# Patient Record
Sex: Female | Born: 1950 | Race: White | Hispanic: No | Marital: Married | Smoking: Current some day smoker
Health system: Southern US, Community
[De-identification: ages and names within clinical notes are randomized; demographics above are authoritative.]

## PROBLEM LIST (undated history)

## (undated) DIAGNOSIS — I10 Essential (primary) hypertension: Secondary | ICD-10-CM

## (undated) DIAGNOSIS — T8859XA Other complications of anesthesia, initial encounter: Secondary | ICD-10-CM

## (undated) DIAGNOSIS — M8580 Other specified disorders of bone density and structure, unspecified site: Secondary | ICD-10-CM

## (undated) DIAGNOSIS — E079 Disorder of thyroid, unspecified: Secondary | ICD-10-CM

## (undated) DIAGNOSIS — F319 Bipolar disorder, unspecified: Secondary | ICD-10-CM

## (undated) DIAGNOSIS — N19 Unspecified kidney failure: Secondary | ICD-10-CM

## (undated) DIAGNOSIS — G4733 Obstructive sleep apnea (adult) (pediatric): Secondary | ICD-10-CM

## (undated) DIAGNOSIS — J449 Chronic obstructive pulmonary disease, unspecified: Secondary | ICD-10-CM

## (undated) DIAGNOSIS — E785 Hyperlipidemia, unspecified: Secondary | ICD-10-CM

## (undated) DIAGNOSIS — H3530 Unspecified macular degeneration: Secondary | ICD-10-CM

## (undated) DIAGNOSIS — T7840XA Allergy, unspecified, initial encounter: Secondary | ICD-10-CM

## (undated) DIAGNOSIS — H9190 Unspecified hearing loss, unspecified ear: Secondary | ICD-10-CM

## (undated) DIAGNOSIS — R339 Retention of urine, unspecified: Secondary | ICD-10-CM

## (undated) DIAGNOSIS — B9620 Unspecified Escherichia coli [E. coli] as the cause of diseases classified elsewhere: Secondary | ICD-10-CM

## (undated) DIAGNOSIS — M1990 Unspecified osteoarthritis, unspecified site: Secondary | ICD-10-CM

## (undated) DIAGNOSIS — T4145XA Adverse effect of unspecified anesthetic, initial encounter: Secondary | ICD-10-CM

## (undated) DIAGNOSIS — Z9989 Dependence on other enabling machines and devices: Secondary | ICD-10-CM

## (undated) DIAGNOSIS — A419 Sepsis, unspecified organism: Secondary | ICD-10-CM

## (undated) DIAGNOSIS — N390 Urinary tract infection, site not specified: Secondary | ICD-10-CM

## (undated) DIAGNOSIS — C649 Malignant neoplasm of unspecified kidney, except renal pelvis: Secondary | ICD-10-CM

## (undated) HISTORY — PX: CHOLECYSTECTOMY: SHX55

## (undated) HISTORY — DX: Unspecified osteoarthritis, unspecified site: M19.90

## (undated) HISTORY — DX: Disorder of thyroid, unspecified: E07.9

## (undated) HISTORY — DX: Unspecified macular degeneration: H35.30

## (undated) HISTORY — DX: Other specified disorders of bone density and structure, unspecified site: M85.80

## (undated) HISTORY — DX: Malignant neoplasm of unspecified kidney, except renal pelvis: C64.9

## (undated) HISTORY — PX: BLADDER SUSPENSION: SHX72

## (undated) HISTORY — DX: Essential (primary) hypertension: I10

## (undated) HISTORY — DX: Hyperlipidemia, unspecified: E78.5

## (undated) HISTORY — PX: TIBIA FRACTURE SURGERY: SHX806

## (undated) HISTORY — PX: ABDOMINAL HYSTERECTOMY: SHX81

## (undated) HISTORY — PX: TONSILLECTOMY: SUR1361

## (undated) HISTORY — PX: ROTATOR CUFF REPAIR: SHX139

## (undated) HISTORY — DX: Chronic obstructive pulmonary disease, unspecified: J44.9

## (undated) HISTORY — DX: Bipolar disorder, unspecified: F31.9

## (undated) HISTORY — DX: Allergy, unspecified, initial encounter: T78.40XA

## (undated) HISTORY — PX: NEPHRECTOMY: SHX65

## (undated) HISTORY — DX: Retention of urine, unspecified: R33.9

## (undated) HISTORY — PX: APPENDECTOMY: SHX54

## (undated) HISTORY — PX: EXTERNAL EAR SURGERY: SHX627

## (undated) HISTORY — DX: Unspecified hearing loss, unspecified ear: H91.90

## (undated) HISTORY — PX: CERVICAL DISC SURGERY: SHX588

---

## 1998-02-19 HISTORY — PX: LIVER BIOPSY: SHX301

## 1999-02-12 ENCOUNTER — Encounter: Payer: Self-pay | Admitting: Neurosurgery

## 1999-02-13 ENCOUNTER — Ambulatory Visit (HOSPITAL_COMMUNITY): Admission: RE | Admit: 1999-02-13 | Discharge: 1999-02-14 | Payer: Self-pay | Admitting: Neurosurgery

## 1999-02-13 ENCOUNTER — Encounter: Payer: Self-pay | Admitting: Neurosurgery

## 1999-03-17 ENCOUNTER — Encounter: Admission: RE | Admit: 1999-03-17 | Discharge: 1999-03-17 | Payer: Self-pay | Admitting: Neurosurgery

## 1999-03-17 ENCOUNTER — Encounter: Payer: Self-pay | Admitting: Neurosurgery

## 1999-04-21 ENCOUNTER — Encounter: Payer: Self-pay | Admitting: Neurosurgery

## 1999-04-21 ENCOUNTER — Encounter: Admission: RE | Admit: 1999-04-21 | Discharge: 1999-04-21 | Payer: Self-pay | Admitting: Neurosurgery

## 1999-05-20 ENCOUNTER — Encounter: Admission: RE | Admit: 1999-05-20 | Discharge: 1999-05-20 | Payer: Self-pay | Admitting: Neurosurgery

## 1999-05-20 ENCOUNTER — Encounter: Payer: Self-pay | Admitting: Neurosurgery

## 1999-06-10 ENCOUNTER — Inpatient Hospital Stay (HOSPITAL_COMMUNITY): Admission: RE | Admit: 1999-06-10 | Discharge: 1999-06-11 | Payer: Self-pay | Admitting: Neurosurgery

## 1999-07-29 ENCOUNTER — Encounter: Payer: Self-pay | Admitting: Neurosurgery

## 1999-07-29 ENCOUNTER — Encounter: Admission: RE | Admit: 1999-07-29 | Discharge: 1999-07-29 | Payer: Self-pay | Admitting: Neurosurgery

## 1999-12-20 ENCOUNTER — Encounter: Payer: Self-pay | Admitting: Family Medicine

## 1999-12-20 LAB — CONVERTED CEMR LAB

## 2000-01-15 ENCOUNTER — Other Ambulatory Visit: Admission: RE | Admit: 2000-01-15 | Discharge: 2000-01-15 | Payer: Self-pay | Admitting: Family Medicine

## 2000-01-20 HISTORY — PX: LEEP: SHX91

## 2000-05-19 ENCOUNTER — Other Ambulatory Visit: Admission: RE | Admit: 2000-05-19 | Discharge: 2000-05-19 | Payer: Self-pay | Admitting: Family Medicine

## 2000-05-27 ENCOUNTER — Encounter: Payer: Self-pay | Admitting: Family Medicine

## 2000-09-28 ENCOUNTER — Inpatient Hospital Stay (HOSPITAL_COMMUNITY): Admission: EM | Admit: 2000-09-28 | Discharge: 2000-10-02 | Payer: Self-pay | Admitting: Emergency Medicine

## 2003-06-14 ENCOUNTER — Other Ambulatory Visit: Payer: Self-pay

## 2003-06-19 ENCOUNTER — Other Ambulatory Visit: Payer: Self-pay

## 2003-06-20 HISTORY — PX: SEPTOPLASTY: SUR1290

## 2003-06-26 ENCOUNTER — Other Ambulatory Visit: Payer: Self-pay

## 2003-11-29 ENCOUNTER — Ambulatory Visit: Payer: Self-pay | Admitting: Urology

## 2003-12-04 ENCOUNTER — Ambulatory Visit: Payer: Self-pay | Admitting: Unknown Physician Specialty

## 2004-03-10 ENCOUNTER — Ambulatory Visit: Payer: Self-pay | Admitting: Family Medicine

## 2004-04-23 ENCOUNTER — Ambulatory Visit: Payer: Self-pay | Admitting: Family Medicine

## 2004-04-24 ENCOUNTER — Ambulatory Visit: Payer: Self-pay | Admitting: Family Medicine

## 2004-05-22 ENCOUNTER — Ambulatory Visit: Payer: Self-pay | Admitting: Family Medicine

## 2004-06-11 ENCOUNTER — Ambulatory Visit: Payer: Self-pay | Admitting: Urology

## 2004-08-13 ENCOUNTER — Ambulatory Visit: Payer: Self-pay | Admitting: Family Medicine

## 2004-08-28 ENCOUNTER — Ambulatory Visit: Payer: Self-pay

## 2004-08-28 ENCOUNTER — Ambulatory Visit: Payer: Self-pay | Admitting: Family Medicine

## 2004-09-30 ENCOUNTER — Ambulatory Visit: Payer: Self-pay | Admitting: Family Medicine

## 2004-10-17 ENCOUNTER — Ambulatory Visit: Payer: Self-pay | Admitting: Urology

## 2004-10-30 ENCOUNTER — Ambulatory Visit: Payer: Self-pay | Admitting: Family Medicine

## 2004-11-14 ENCOUNTER — Ambulatory Visit: Payer: Self-pay | Admitting: Internal Medicine

## 2004-12-03 ENCOUNTER — Ambulatory Visit: Payer: Self-pay | Admitting: Family Medicine

## 2004-12-05 ENCOUNTER — Ambulatory Visit: Payer: Self-pay | Admitting: Unknown Physician Specialty

## 2005-03-06 ENCOUNTER — Ambulatory Visit: Payer: Self-pay | Admitting: Family Medicine

## 2005-03-25 ENCOUNTER — Other Ambulatory Visit: Payer: Self-pay

## 2005-03-25 ENCOUNTER — Ambulatory Visit: Payer: Self-pay | Admitting: Specialist

## 2005-03-30 ENCOUNTER — Ambulatory Visit: Payer: Self-pay | Admitting: Family Medicine

## 2005-03-31 ENCOUNTER — Ambulatory Visit: Payer: Self-pay | Admitting: Specialist

## 2005-05-06 ENCOUNTER — Ambulatory Visit: Payer: Self-pay | Admitting: Family Medicine

## 2005-10-01 ENCOUNTER — Ambulatory Visit: Payer: Self-pay | Admitting: Family Medicine

## 2005-10-02 ENCOUNTER — Emergency Department: Payer: Self-pay | Admitting: Emergency Medicine

## 2005-10-03 ENCOUNTER — Emergency Department: Payer: Self-pay | Admitting: Emergency Medicine

## 2005-10-04 ENCOUNTER — Emergency Department: Payer: Self-pay | Admitting: Emergency Medicine

## 2005-10-06 ENCOUNTER — Ambulatory Visit: Payer: Self-pay | Admitting: Family Medicine

## 2005-10-23 ENCOUNTER — Emergency Department: Payer: Self-pay | Admitting: Urology

## 2005-10-30 ENCOUNTER — Ambulatory Visit: Payer: Self-pay | Admitting: Family Medicine

## 2005-11-05 ENCOUNTER — Ambulatory Visit: Payer: Self-pay | Admitting: Family Medicine

## 2005-11-18 ENCOUNTER — Other Ambulatory Visit: Payer: Self-pay

## 2005-11-18 ENCOUNTER — Ambulatory Visit: Payer: Self-pay

## 2006-01-13 ENCOUNTER — Ambulatory Visit: Payer: Self-pay | Admitting: Unknown Physician Specialty

## 2006-01-20 ENCOUNTER — Ambulatory Visit: Payer: Self-pay | Admitting: Family Medicine

## 2006-01-25 ENCOUNTER — Ambulatory Visit: Payer: Self-pay | Admitting: Cardiology

## 2006-02-04 ENCOUNTER — Ambulatory Visit: Payer: Self-pay

## 2006-02-04 ENCOUNTER — Encounter: Payer: Self-pay | Admitting: Cardiology

## 2006-02-17 ENCOUNTER — Ambulatory Visit: Payer: Self-pay | Admitting: Cardiology

## 2006-03-22 ENCOUNTER — Ambulatory Visit: Payer: Self-pay | Admitting: Family Medicine

## 2006-03-22 LAB — CONVERTED CEMR LAB
ALT: 20 units/L (ref 0–40)
AST: 19 units/L (ref 0–37)
Albumin: 3.6 g/dL (ref 3.5–5.2)
Alkaline Phosphatase: 95 units/L (ref 39–117)
Bilirubin, Direct: 0.1 mg/dL (ref 0.0–0.3)
Cholesterol: 160 mg/dL (ref 0–200)
HDL: 53 mg/dL (ref >39.0)
LDL Cholesterol: 91 mg/dL (ref 0–99)
Total Bilirubin: 0.4 mg/dL (ref 0.3–1.2)
Total CHOL/HDL Ratio: 3
Total Protein: 6.2 g/dL (ref 6.0–8.3)
Triglycerides: 78 mg/dL (ref 0–149)
VLDL: 16 mg/dL (ref 0–40)

## 2006-03-23 ENCOUNTER — Ambulatory Visit: Payer: Self-pay | Admitting: Family Medicine

## 2006-04-28 ENCOUNTER — Encounter: Payer: Self-pay | Admitting: Family Medicine

## 2006-04-28 DIAGNOSIS — J449 Chronic obstructive pulmonary disease, unspecified: Secondary | ICD-10-CM

## 2006-04-28 DIAGNOSIS — G4730 Sleep apnea, unspecified: Secondary | ICD-10-CM | POA: Insufficient documentation

## 2006-04-28 DIAGNOSIS — E7800 Pure hypercholesterolemia, unspecified: Secondary | ICD-10-CM

## 2006-04-28 DIAGNOSIS — H9190 Unspecified hearing loss, unspecified ear: Secondary | ICD-10-CM | POA: Insufficient documentation

## 2006-04-28 DIAGNOSIS — J4489 Other specified chronic obstructive pulmonary disease: Secondary | ICD-10-CM | POA: Insufficient documentation

## 2006-04-28 DIAGNOSIS — IMO0002 Reserved for concepts with insufficient information to code with codable children: Secondary | ICD-10-CM

## 2006-04-28 DIAGNOSIS — C649 Malignant neoplasm of unspecified kidney, except renal pelvis: Secondary | ICD-10-CM | POA: Insufficient documentation

## 2006-04-28 DIAGNOSIS — F319 Bipolar disorder, unspecified: Secondary | ICD-10-CM | POA: Insufficient documentation

## 2006-04-28 DIAGNOSIS — E063 Autoimmune thyroiditis: Secondary | ICD-10-CM

## 2006-04-28 DIAGNOSIS — E039 Hypothyroidism, unspecified: Secondary | ICD-10-CM | POA: Insufficient documentation

## 2006-04-28 DIAGNOSIS — F6089 Other specific personality disorders: Secondary | ICD-10-CM

## 2006-04-28 DIAGNOSIS — M1990 Unspecified osteoarthritis, unspecified site: Secondary | ICD-10-CM | POA: Insufficient documentation

## 2006-05-03 ENCOUNTER — Ambulatory Visit: Payer: Self-pay | Admitting: Urology

## 2006-08-10 ENCOUNTER — Telehealth (INDEPENDENT_AMBULATORY_CARE_PROVIDER_SITE_OTHER): Payer: Self-pay | Admitting: *Deleted

## 2006-08-19 ENCOUNTER — Encounter: Payer: Self-pay | Admitting: Family Medicine

## 2006-11-03 ENCOUNTER — Encounter: Payer: Self-pay | Admitting: Family Medicine

## 2006-11-19 ENCOUNTER — Ambulatory Visit: Payer: Self-pay | Admitting: Family Medicine

## 2006-12-28 ENCOUNTER — Encounter: Payer: Self-pay | Admitting: Family Medicine

## 2007-01-05 ENCOUNTER — Encounter (INDEPENDENT_AMBULATORY_CARE_PROVIDER_SITE_OTHER): Payer: Self-pay | Admitting: Internal Medicine

## 2007-01-05 ENCOUNTER — Ambulatory Visit: Payer: Self-pay | Admitting: Family Medicine

## 2007-01-07 ENCOUNTER — Encounter: Payer: Self-pay | Admitting: Family Medicine

## 2007-01-11 ENCOUNTER — Ambulatory Visit: Payer: Self-pay | Admitting: Family Medicine

## 2007-01-11 LAB — CONVERTED CEMR LAB
CO2: 36 meq/L — ABNORMAL HIGH (ref 19–32)
Chloride: 98 meq/L (ref 96–112)
GFR calc Af Amer: 73 mL/min
GFR calc non Af Amer: 61 mL/min
Glucose, Bld: 96 mg/dL (ref 70–99)
Sodium: 141 meq/L (ref 135–145)

## 2007-01-28 ENCOUNTER — Encounter: Payer: Self-pay | Admitting: Family Medicine

## 2007-02-09 ENCOUNTER — Ambulatory Visit: Payer: Self-pay | Admitting: Unknown Physician Specialty

## 2007-02-16 ENCOUNTER — Ambulatory Visit: Payer: Self-pay | Admitting: Family Medicine

## 2007-02-16 DIAGNOSIS — F17200 Nicotine dependence, unspecified, uncomplicated: Secondary | ICD-10-CM

## 2007-02-21 LAB — CONVERTED CEMR LAB
ALT: 19 U/L
AST: 19 U/L
Albumin: 3.9 g/dL
BUN: 9 mg/dL
CO2: 33 meq/L — ABNORMAL HIGH
Calcium: 9.8 mg/dL
Chloride: 100 meq/L
Cholesterol: 188 mg/dL
Creatinine, Ser: 1 mg/dL
GFR calc Af Amer: 73 mL/min
GFR calc non Af Amer: 61 mL/min
Glucose, Bld: 104 mg/dL — ABNORMAL HIGH
HDL: 57 mg/dL
LDL Cholesterol: 110 mg/dL — ABNORMAL HIGH
Phosphorus: 3.9 mg/dL
Potassium: 4.1 meq/L
Sodium: 141 meq/L
TSH: 0.29 u[IU]/mL — ABNORMAL LOW
Total CHOL/HDL Ratio: 3.3
Triglycerides: 107 mg/dL
VLDL: 21 mg/dL

## 2007-05-04 ENCOUNTER — Encounter: Payer: Self-pay | Admitting: Family Medicine

## 2007-05-04 ENCOUNTER — Ambulatory Visit: Payer: Self-pay | Admitting: Urology

## 2007-05-18 ENCOUNTER — Ambulatory Visit: Payer: Self-pay | Admitting: Family Medicine

## 2007-05-19 LAB — CONVERTED CEMR LAB
Albumin: 3.9 g/dL (ref 3.5–5.2)
BUN: 4 mg/dL — ABNORMAL LOW (ref 6–23)
CO2: 32 meq/L (ref 19–32)
Chloride: 97 meq/L (ref 96–112)
Creatinine, Ser: 0.7 mg/dL (ref 0.4–1.2)
GFR calc Af Amer: 111 mL/min
Glucose, Bld: 83 mg/dL (ref 70–99)
TSH: 1.38 microintl units/mL (ref 0.35–5.50)

## 2007-06-16 ENCOUNTER — Encounter: Payer: Self-pay | Admitting: Family Medicine

## 2007-08-16 ENCOUNTER — Ambulatory Visit: Payer: Self-pay | Admitting: Family Medicine

## 2007-08-18 LAB — CONVERTED CEMR LAB
ALT: 19 units/L (ref 0–35)
Albumin: 3.8 g/dL (ref 3.5–5.2)
BUN: 10 mg/dL (ref 6–23)
Bilirubin, Direct: 0.1 mg/dL (ref 0.0–0.3)
Chloride: 100 meq/L (ref 96–112)
Cholesterol: 191 mg/dL (ref 0–200)
GFR calc Af Amer: 95 mL/min
GFR calc non Af Amer: 78 mL/min
HDL: 40.9 mg/dL (ref >39.0)
LDL Cholesterol: 115 mg/dL — ABNORMAL HIGH (ref 0–99)
Phosphorus: 3.9 mg/dL (ref 2.3–4.6)
Potassium: 3.3 meq/L — ABNORMAL LOW (ref 3.5–5.1)
Sodium: 140 meq/L (ref 135–145)
Total Protein: 6.5 g/dL (ref 6.0–8.3)
Triglycerides: 174 mg/dL — ABNORMAL HIGH (ref 0–149)
VLDL: 35 mg/dL (ref 0–40)

## 2007-08-19 ENCOUNTER — Ambulatory Visit: Payer: Self-pay | Admitting: Family Medicine

## 2007-08-19 DIAGNOSIS — R7309 Other abnormal glucose: Secondary | ICD-10-CM

## 2007-08-23 LAB — CONVERTED CEMR LAB
Hgb A1c MFr Bld: 5.6 % (ref 4.6–6.0)
Potassium: 3.9 meq/L (ref 3.5–5.1)

## 2007-09-27 ENCOUNTER — Ambulatory Visit: Payer: Self-pay | Admitting: Family Medicine

## 2007-10-18 ENCOUNTER — Ambulatory Visit: Payer: Self-pay | Admitting: Family Medicine

## 2007-11-02 ENCOUNTER — Encounter: Payer: Self-pay | Admitting: Family Medicine

## 2007-12-13 ENCOUNTER — Ambulatory Visit: Payer: Self-pay | Admitting: Family Medicine

## 2008-01-17 ENCOUNTER — Ambulatory Visit: Payer: Self-pay | Admitting: Family Medicine

## 2008-01-19 LAB — CONVERTED CEMR LAB
AST: 22 units/L (ref 0–37)
Albumin: 3.8 g/dL (ref 3.5–5.2)
CO2: 37 meq/L — ABNORMAL HIGH (ref 19–32)
Calcium: 9.7 mg/dL (ref 8.4–10.5)
GFR calc Af Amer: 111 mL/min
GFR calc non Af Amer: 91 mL/min
Glucose, Bld: 102 mg/dL — ABNORMAL HIGH (ref 70–99)
HDL: 63.6 mg/dL (ref >39.0)
Potassium: 3.8 meq/L (ref 3.5–5.1)
Sodium: 141 meq/L (ref 135–145)
Total CHOL/HDL Ratio: 3.2
Triglycerides: 121 mg/dL (ref 0–149)

## 2008-01-23 ENCOUNTER — Ambulatory Visit: Payer: Self-pay | Admitting: Family Medicine

## 2008-01-25 ENCOUNTER — Ambulatory Visit: Payer: Self-pay | Admitting: Unknown Physician Specialty

## 2008-02-10 ENCOUNTER — Ambulatory Visit: Payer: Self-pay | Admitting: Unknown Physician Specialty

## 2008-02-28 ENCOUNTER — Ambulatory Visit: Payer: Self-pay | Admitting: Family Medicine

## 2008-03-01 ENCOUNTER — Ambulatory Visit: Payer: Self-pay | Admitting: Family Medicine

## 2008-03-13 ENCOUNTER — Encounter: Payer: Self-pay | Admitting: Family Medicine

## 2008-03-19 ENCOUNTER — Encounter: Payer: Self-pay | Admitting: Family Medicine

## 2008-03-26 ENCOUNTER — Encounter: Payer: Self-pay | Admitting: Family Medicine

## 2008-04-24 ENCOUNTER — Ambulatory Visit: Payer: Self-pay | Admitting: Family Medicine

## 2008-04-25 LAB — CONVERTED CEMR LAB
Basophils Absolute: 0 10*3/uL (ref 0.0–0.1)
Creatinine, Ser: 0.8 mg/dL (ref 0.4–1.2)
Eosinophils Absolute: 0.1 10*3/uL (ref 0.0–0.7)
Glucose, Bld: 122 mg/dL — ABNORMAL HIGH (ref 70–99)
Lymphocytes Relative: 28.1 % (ref 12.0–46.0)
MCHC: 34.3 g/dL (ref 30.0–36.0)
Monocytes Relative: 7.9 % (ref 3.0–12.0)
Neutrophils Relative %: 62.6 % (ref 43.0–77.0)
Phosphorus: 3.9 mg/dL (ref 2.3–4.6)
Platelets: 191 10*3/uL (ref 150.0–400.0)
Potassium: 3.1 meq/L — ABNORMAL LOW (ref 3.5–5.1)
RDW: 12.9 % (ref 11.5–14.6)
T3 Uptake Ratio: 36.5 % (ref 22.5–37.0)

## 2008-05-01 ENCOUNTER — Ambulatory Visit: Payer: Self-pay | Admitting: Family Medicine

## 2008-05-01 DIAGNOSIS — E876 Hypokalemia: Secondary | ICD-10-CM | POA: Insufficient documentation

## 2008-05-04 ENCOUNTER — Encounter: Payer: Self-pay | Admitting: Family Medicine

## 2008-05-09 ENCOUNTER — Encounter: Payer: Self-pay | Admitting: Family Medicine

## 2008-05-15 ENCOUNTER — Ambulatory Visit: Payer: Self-pay | Admitting: Family Medicine

## 2008-05-29 LAB — CONVERTED CEMR LAB: Potassium: 3.6 meq/L (ref 3.5–5.1)

## 2008-07-06 ENCOUNTER — Encounter (INDEPENDENT_AMBULATORY_CARE_PROVIDER_SITE_OTHER): Payer: Self-pay | Admitting: *Deleted

## 2008-08-06 ENCOUNTER — Ambulatory Visit: Payer: Self-pay | Admitting: Urology

## 2008-08-06 ENCOUNTER — Encounter: Payer: Self-pay | Admitting: Family Medicine

## 2008-08-16 ENCOUNTER — Ambulatory Visit: Payer: Self-pay | Admitting: Family Medicine

## 2008-08-17 LAB — CONVERTED CEMR LAB
ALT: 15 units/L (ref 0–35)
Albumin: 3.9 g/dL (ref 3.5–5.2)
BUN: 9 mg/dL (ref 6–23)
CO2: 33 meq/L — ABNORMAL HIGH (ref 19–32)
Calcium: 9 mg/dL (ref 8.4–10.5)
Chloride: 100 meq/L (ref 96–112)
Cholesterol: 186 mg/dL (ref 0–200)
Hgb A1c MFr Bld: 5.2 % (ref 4.6–6.5)
Total CHOL/HDL Ratio: 3
Triglycerides: 75 mg/dL (ref 0.0–149.0)

## 2008-09-25 ENCOUNTER — Telehealth: Payer: Self-pay | Admitting: Family Medicine

## 2008-10-08 ENCOUNTER — Encounter (INDEPENDENT_AMBULATORY_CARE_PROVIDER_SITE_OTHER): Payer: Self-pay | Admitting: *Deleted

## 2008-10-08 ENCOUNTER — Telehealth: Payer: Self-pay | Admitting: Family Medicine

## 2008-10-16 ENCOUNTER — Encounter: Payer: Self-pay | Admitting: Family Medicine

## 2008-10-31 ENCOUNTER — Ambulatory Visit: Payer: Self-pay | Admitting: Family Medicine

## 2008-12-12 ENCOUNTER — Ambulatory Visit: Payer: Self-pay | Admitting: Family Medicine

## 2008-12-12 DIAGNOSIS — J309 Allergic rhinitis, unspecified: Secondary | ICD-10-CM | POA: Insufficient documentation

## 2009-01-29 ENCOUNTER — Ambulatory Visit: Payer: Self-pay | Admitting: Family Medicine

## 2009-01-30 ENCOUNTER — Telehealth: Payer: Self-pay | Admitting: Family Medicine

## 2009-01-31 LAB — CONVERTED CEMR LAB
BUN: 9 mg/dL (ref 6–23)
Calcium: 9.5 mg/dL (ref 8.4–10.5)
Chloride: 99 meq/L (ref 96–112)
Cholesterol: 186 mg/dL (ref 0–200)
Glucose, Bld: 91 mg/dL (ref 70–99)
Potassium: 4.4 meq/L (ref 3.5–5.3)
TSH: 2.603 microintl units/mL (ref 0.350–4.500)
Total CHOL/HDL Ratio: 2.7
Triglycerides: 97 mg/dL (ref <150)

## 2009-02-07 ENCOUNTER — Encounter: Payer: Self-pay | Admitting: Family Medicine

## 2009-02-07 ENCOUNTER — Ambulatory Visit: Payer: Self-pay | Admitting: Unknown Physician Specialty

## 2009-02-13 ENCOUNTER — Ambulatory Visit: Payer: Self-pay | Admitting: Family Medicine

## 2009-02-13 DIAGNOSIS — Z8639 Personal history of other endocrine, nutritional and metabolic disease: Secondary | ICD-10-CM

## 2009-02-13 DIAGNOSIS — Z862 Personal history of diseases of the blood and blood-forming organs and certain disorders involving the immune mechanism: Secondary | ICD-10-CM

## 2009-02-15 LAB — CONVERTED CEMR LAB
AST: 22 units/L (ref 0–37)
Alkaline Phosphatase: 84 units/L (ref 39–117)
Total Bilirubin: 0.5 mg/dL (ref 0.3–1.2)

## 2009-02-19 ENCOUNTER — Ambulatory Visit: Payer: Self-pay | Admitting: Unknown Physician Specialty

## 2009-03-05 ENCOUNTER — Encounter: Payer: Self-pay | Admitting: Family Medicine

## 2009-04-29 ENCOUNTER — Ambulatory Visit: Payer: Self-pay | Admitting: Family Medicine

## 2009-04-29 DIAGNOSIS — M8580 Other specified disorders of bone density and structure, unspecified site: Secondary | ICD-10-CM

## 2009-04-30 ENCOUNTER — Encounter: Payer: Self-pay | Admitting: Family Medicine

## 2009-04-30 LAB — CONVERTED CEMR LAB
Bilirubin, Direct: 0.1 mg/dL (ref 0.0–0.3)
Glucose, Bld: 92 mg/dL (ref 70–99)
LDL Cholesterol: 116 mg/dL — ABNORMAL HIGH (ref 0–99)
Phosphorus: 4.6 mg/dL (ref 2.3–4.6)
Potassium: 4.4 meq/L (ref 3.5–5.1)
Sodium: 144 meq/L (ref 135–145)
Total Bilirubin: 0.4 mg/dL (ref 0.3–1.2)
VLDL: 16.2 mg/dL (ref 0.0–40.0)

## 2009-08-21 ENCOUNTER — Ambulatory Visit: Payer: Self-pay | Admitting: Urology

## 2009-08-29 ENCOUNTER — Encounter: Payer: Self-pay | Admitting: Family Medicine

## 2009-10-29 ENCOUNTER — Ambulatory Visit: Payer: Self-pay | Admitting: Family Medicine

## 2009-10-31 LAB — CONVERTED CEMR LAB
AST: 17 units/L (ref 0–37)
Albumin: 4.1 g/dL (ref 3.5–5.2)
BUN: 10 mg/dL (ref 6–23)
Basophils Absolute: 0 10*3/uL (ref 0.0–0.1)
Chloride: 98 meq/L (ref 96–112)
Cholesterol: 188 mg/dL (ref 0–200)
Eosinophils Absolute: 0.1 10*3/uL (ref 0.0–0.7)
Glucose, Bld: 84 mg/dL (ref 70–99)
Hemoglobin: 13.6 g/dL (ref 12.0–15.0)
LDL Cholesterol: 113 mg/dL — ABNORMAL HIGH (ref 0–99)
Lymphocytes Relative: 32.9 % (ref 12.0–46.0)
MCHC: 34.4 g/dL (ref 30.0–36.0)
MCV: 97.9 fL (ref 78.0–100.0)
Monocytes Absolute: 0.4 10*3/uL (ref 0.1–1.0)
Neutro Abs: 2.7 10*3/uL (ref 1.4–7.7)
Phosphorus: 4.1 mg/dL (ref 2.3–4.6)
RDW: 13.7 % (ref 11.5–14.6)
Triglycerides: 85 mg/dL (ref 0.0–149.0)
VLDL: 17 mg/dL (ref 0.0–40.0)

## 2010-02-18 NOTE — Letter (Signed)
Summary: [facility]  [facility]   Imported By: Lanelle Bal 04/12/2009 12:30:13  _____________________________________________________________________  External Attachment:    Type:   Image     Comment:   External Document

## 2010-02-18 NOTE — Letter (Signed)
Summary: [facility]  [facility]   Imported By: Lanelle Bal 02/19/2009 10:20:34  _____________________________________________________________________  External Attachment:    Type:   Image     Comment:   External Document

## 2010-02-18 NOTE — Assessment & Plan Note (Signed)
Summary: 6 MONTH FOLLOW UP/[facility]   Vital Signs:  Patient profile:   60 year old female Height:      60 inches Weight:      122.50 pounds BMI:     24.01 Temp:     97.6 degrees F oral Pulse rate:   76 / minute Pulse rhythm:   regular BP sitting:   138 / 80  (left arm) Cuff size:   regular  Vitals Entered By: Lewanda Rife LPN (April 29, 2009 8:12 AM)  Serial Vital Signs/Assessments:  Time      Position  BP       Pulse  Resp  Temp     By                     852/77                         Judith Part MD  CC: six month check up   History of Present Illness: here for f/u of HTN and lipid / thyroid/ osteopenia , smoking  is feeling fine   cannot get warm-- this her usual even when thyroid is theraputic   last labs showed inc lfts -- these went to nl after stopping zetia --unusual she also failed a statin for this reason last lipids good with LDL 97  wt continues to be stable after significant loss-- is still really working on it  is walking for exercise- but not as much  [facility] for [facility]   last tsh stable   due for [facility] for hyperglycemia  diet has been fairly good -- staying away from sweets and fats - fairly balanced  dexa showed osteopenia slt wors with LS T score -2.0 and FN -2.1 ca and D-- is taking 1 per day   HTN-- bp has been stable  smoking - still not doing very well- less than a pack per day - not ready to quit yet breathing has been generally fine   Allergies: 1)  ! * Adhesive 2)  ! * Spearmint 3)  ! * Naproxen 4)  ! * Hctz 5)  ! Lescol 6)  ! * Zetia  Past History:  Past Surgical History: Last updated: 04/28/2006 Appendectomy Cholecystectomy GU surgery- bladder tack Hysterectomy Nephrectomy- left Rotator cuff repair Tonsillectomy Right ear surgery X 3 Left leg fracture- as child Abd. US- liver cysts (02/1998) C7 radiculopathy and spondylisis (01/1999) Bone spur surgery (02/1999) Dexa- osteopenia  (01/2000) LEEP 906/2002) ENT surgery- septorhinoplasty (06/2003) Nuclear stress test- neg. (01/2006)  Family History: Last updated: 04/28/2006 on disability for psychiatric   Social History: Last updated: 05/18/2007 Patient currently smokes.  on disability for psych married 2 daughters  Risk Factors: Smoking Status: current (04/28/2006)  Past Medical History: COPD- tob abuse Hypertension Hypothyroidism- goiter Osteoarthritis- hip/ ankle bipolar kidney ca urinary retention deafness- cochlear implants bilat hyperlipidemia  macular deg L eye  allergic rhinitis  osteopenia   gyn- Van dalen   Review of Systems General:  Denies fatigue, loss of appetite, and malaise. Eyes:  Denies blurring and eye pain. CV:  Denies chest pain or discomfort, lightheadness, and palpitations. Resp:  Complains of shortness of breath; denies cough, pleuritic, and wheezing. GI:  Denies abdominal pain, change in bowel habits, indigestion, nausea, and vomiting. GU:  Denies dysuria and urinary hesitancy. MS:  Denies joint redness and joint swelling. Derm:  Denies  itching, lesion(s), poor wound healing, and rash. Neuro:  Denies numbness and tingling. Psych:  Denies anxiety and depression. Endo:  Complains of cold intolerance; denies excessive thirst, excessive urination, and heat intolerance. Heme:  Denies abnormal bruising and bleeding.  Physical Exam  General:  Well-developed,well-nourished,in no acute distress; alert,appropriate and cooperative throughout examination Head:  normocephalic, atraumatic, and no abnormalities observed.   Eyes:  vision grossly intact, pupils equal, pupils round, and pupils reactive to light.  no conjunctival pallor, injection or icterus  Ears:  hearing aids with cochlear implants  Mouth:  pharynx pink and moist.   Neck:  supple with full rom and no masses or thyromegally, no JVD or carotid bruit  Chest Wall:  No deformities, masses, or tenderness noted. Lungs:   diffusely distant bs with fair air exch and proloned exp time no crackles or rales  scant wheeze on forced expiration Heart:  Normal rate and regular rhythm. S1 and S2 normal without gallop, murmur, click, rub or other extra sounds. Abdomen:  Bowel sounds positive,abdomen soft and non-tender without masses, organomegaly or hernias noted. no renal bruits  Msk:  No deformity or scoliosis noted of thoracic or lumbar spine.  no acute joint changes  Pulses:  R and L carotid,radial,femoral,dorsalis pedis and posterior tibial pulses are full and equal bilaterally Extremities:  no edema today Neurologic:  sensation intact to light touch, gait normal, and DTRs symmetrical and normal.   Skin:  Intact without suspicious lesions or rashes Cervical Nodes:  No lymphadenopathy noted Inguinal Nodes:  No significant adenopathy Psych:  nl affect - talkative   Impression & Recommendations:  Problem # 1:  OSTEOPENIA (ICD-733.90) Assessment New disc bone density report disc imp of smoking cessation and wt bearing exercise along with ca and D rev req for this  check D level today- is outdoors a lot  may consider med in future -- trying to avoid due to large amt of med she is on (in agreement with her gyn) Her updated medication list for this problem includes:    Caltrate 600+d 600-400 Mg-unit Tabs (Calcium carbonate-vitamin d) .Marland Kitchen... Take 1 tablet by mouth daily  Orders: T-Vitamin D (25-Hydroxy) (63875-64332) Specimen Handling (95188)  Problem # 2:  LIVER FUNCTION TESTS, ABNORMAL, HX OF (ICD-V12.2) Assessment: Improved resolved off zetia - check today rev low sat fat diet to stick to   Problem # 3:  HYPERGLYCEMIA (ICD-790.29) Assessment: Unchanged  diet is good and wt is stable after loss check sugar today  urged exercise  Orders: TLB-Lipid Panel (80061-LIPID) TLB-Renal Function Panel (80069-RENAL) TLB-Hepatic/Liver Function Pnl (80076-HEPATIC) TLB-ALT (SGPT) (84460-ALT) TLB-AST (SGOT)  (84450-SGOT) TLB-A1C / Hgb A1C (Glycohemoglobin) (83036-A1C)  Labs Reviewed: Creat: 0.88 (01/29/2009)     Problem # 4:  Hx of HYPERCHOLESTEROLEMIA (ICD-272.0) Assessment: Unchanged  check lipids off all med - due to inc lft with zetia and statins rv low sat fat diet- doing well lab today The following medications were removed from the medication list:    Zetia 10 Mg Tabs (Ezetimibe) .Marland Kitchen... Take 1 tablet by mouth once a day (hold 1/11 for inc liver enzymes)  Orders: TLB-Lipid Panel (80061-LIPID) TLB-Renal Function Panel (80069-RENAL) TLB-Hepatic/Liver Function Pnl (80076-HEPATIC) TLB-ALT (SGPT) (84460-ALT) TLB-AST (SGOT) (84450-SGOT) TLB-A1C / Hgb A1C (Glycohemoglobin) (83036-A1C)  Labs Reviewed: SGOT: 22 (02/13/2009)   SGPT: 18 (02/13/2009)   HDL:70 (01/29/2009), 66.50 (08/16/2008)  LDL:97 (01/29/2009), 105 (41/66/0630)  Chol:186 (01/29/2009), 186 (08/16/2008)  Trig:97 (01/29/2009), 75.0 (08/16/2008)  Problem # 5:  TOBACCO USE (ICD-305.1) Assessment: Comment Only  pt aware of risks  disc rel of OP  pt not ready to quit yet  disc this very briefly   Problem # 6:  HYPOTHYROIDISM (ICD-244.9) Assessment: Unchanged  stable tsh  pt stays cold baseline-- no clinical change will check again at 6 mo f/u Her updated medication list for this problem includes:    Levothroid 100 Mcg Tabs (Levothyroxine sodium) .Marland Kitchen... 1 by mouth once daily  Labs Reviewed: TSH: 2.603 (01/29/2009)    HgBA1c: 5.2 (08/16/2008) Chol: 186 (01/29/2009)   HDL: 70 (01/29/2009)   LDL: 97 (01/29/2009)   TG: 97 (01/29/2009)  Problem # 7:  HYPERTENSION (ICD-401.9) Assessment: Unchanged  bp better on second check today well controlled on current med continue to watch lab f/u 6 mo get back to exercise  Her updated medication list for this problem includes:    Benazepril Hcl 20 Mg Tabs (Benazepril hcl) .Marland Kitchen... 1 by mouth once daily    Furosemide 40 Mg Tabs (Furosemide) .Marland Kitchen... Take 1 tablet by mouth once a day     Lotrel 10-20 Mg Caps (Amlodipine besy-benazepril hcl) .Marland Kitchen... Take 1 tablet by mouth once a day    Benicar 40 Mg Tabs (Olmesartan medoxomil) .Marland Kitchen... Take one by mouth daily  Orders: TLB-Lipid Panel (80061-LIPID) TLB-Renal Function Panel (80069-RENAL) TLB-Hepatic/Liver Function Pnl (80076-HEPATIC) TLB-ALT (SGPT) (84460-ALT) TLB-AST (SGOT) (84450-SGOT) TLB-A1C / Hgb A1C (Glycohemoglobin) (83036-A1C)  BP today: 138/80- re check 122 /82 at rest  Prior BP: 140/98 (12/12/2008)  Labs Reviewed: K+: 4.4 (01/29/2009) Creat: : 0.88 (01/29/2009)   Chol: 186 (01/29/2009)   HDL: 70 (01/29/2009)   LDL: 97 (01/29/2009)   TG: 97 (01/29/2009)  Complete Medication List: 1)  Clonazepam 0.5 Mg Tabs (Clonazepam) .Marland Kitchen.. 1 by mouth two times a day 2)  Klor-con 10 10 Meq Tbcr (Potassium chloride) .... Take 1 tablet by mouth three times a day 3)  Benazepril Hcl 20 Mg Tabs (Benazepril hcl) .Marland Kitchen.. 1 by mouth once daily 4)  Buspar 15 Mg Tabs (Buspirone hcl) .... Take 1 tablet by mouth two times a day 5)  Trazodone Hcl 100 Mg Tabs (Trazodone hcl) .... Take 2 tablets by mouth daily 6)  Trileptal 600 Mg Tabs (Oxcarbazepine) .... 2  tabs by mouth once daily 7)  Furosemide 40 Mg Tabs (Furosemide) .... Take 1 tablet by mouth once a day 8)  Lotrel 10-20 Mg Caps (Amlodipine besy-benazepril hcl) .... Take 1 tablet by mouth once a day 9)  Advair Diskus 250-50 Mcg/dose Misc (Fluticasone-salmeterol) .Marland Kitchen.. 1 puff two times a day 10)  Albuterol 90 Mcg/act Aers (Albuterol) .... As needed 11)  Flonase 50 Mcg/act Susp (Fluticasone propionate) .Marland Kitchen.. 1 sp each nare two times a day 12)  Centrum Silver Tabs (Multiple vitamins-minerals) .... Take 1 tablet by mouth once a day 13)  Caltrate 600+d 600-400 Mg-unit Tabs (Calcium carbonate-vitamin d) .... Take 1 tablet by mouth daily 14)  Levothroid 100 Mcg Tabs (Levothyroxine sodium) .Marland Kitchen.. 1 by mouth once daily 15)  Wellbutrin Xl 150 Mg Xr24h-tab (Bupropion hcl) .... One by mouth daily 16)  Icaps  Caps (Multiple vitamins-minerals) .... One by mouth daily 17)  Abilify 15 Mg Tabs (Aripiprazole) .... Take one by mouth at night 18)  Cpap Machine  .Marland Kitchen.. 2ml of o2 19)  Flomax 0.4 Mg Xr24h-cap (Tamsulosin hcl) .... Take 1 tablet by mouth once a day 20)  Benicar 40 Mg Tabs (Olmesartan medoxomil) .... Take one by mouth daily 21)  Vitamin B-12 1000 Mcg Tabs (Cyanocobalamin) .... Take 1 tablet by mouth  once a day 22)  Tylenol Extra Strength 500 Mg Tabs (Acetaminophen) .... Otc as directed. 23)  One A Day Extra Garlic 1000mg   .... Take 1 tablet by mouth once a day 24)  Fish Oil 1200 Mg Caps (Omega-3 fatty acids) .... Take 1 capsule by mouth once a day  Other Orders: Venipuncture (16109)  Patient Instructions: 1)  the current recommendation for calcium intake is 1200-1500 mg daily with 1000 IU of vitamin D  (for your bones)  2)  try to add some weight bearing exercise 3)  keep thinking about quitting smoking  4)  labs today  5)  keep eating low sugar/ low fat diet  6)  follow up with me in 6 months   Current Allergies (reviewed today): ! * ADHESIVE ! * SPEARMINT ! * NAPROXEN ! * HCTZ ! LESCOL ! * ZETIA    Preventive Care Screening  T-score L femur:    Date:  01/28/2009    Results:  -2.1   T-score L-Spine:    Date:  01/28/2009    Results:  -2.0   Bone Density:    Date:  01/28/2009    Results:  osteopenia std dev

## 2010-02-18 NOTE — Progress Notes (Signed)
Summary: regarding lab results  Phone Note Outgoing Call Call back at [facility] Phone [phone]   Summary of Call: Advised pt of lab resuts.  She is not taking anything new- is taking garlic, fish oil, B12, cranberry pills and eye vitamins.  No alcohol intake, no illness.  Initial call taken by: Lowella Petties CMA,  January 30, 2009 11:25 AM  Follow-up for Phone Call        thanks for the update please adv her to hold her zetia  re check labs for hepatic fxn panel in 2 weeks will update further with that result  Follow-up by: Judith Part MD,  January 30, 2009 1:14 PM  Additional Follow-up for Phone Call Additional follow up Details #1::        LMOM to call back.                Lowella Petties CMA  January 30, 2009 2:25 PM  Advised pt, lab appt made. Additional Follow-up by: Lowella Petties CMA,  January 30, 2009 2:31 PM    New/Updated Medications: ZETIA 10 MG  TABS (EZETIMIBE) Take 1 tablet by mouth once a day (hold 1/11 for inc liver enzymes)

## 2010-02-18 NOTE — Assessment & Plan Note (Signed)
Summary: FOLLOW UP / LFW   Vital Signs:  Patient profile:   60 year old female Height:      60 inches Weight:      128.25 pounds O2 Sat:      94 % on 2 L/min Temp:     97.7 degrees F oral Pulse rate:   76 / minute Pulse rhythm:   regular BP sitting:   136 / 90  (left arm) Cuff size:   regular  Vitals Entered By: Lewanda Rife LPN (October 29, 2009 8:31 AM)  O2 Flow:  2 L/min CC: six month f/u   History of Present Illness: here for f/u of HTN and lipids and hypothyroid and hyperglycemia  copd  is on 02  breathing - is easier and sleeps better at night  is still smoking - but not with 02 on    wt is up 6 lb  thyroid -- states cold all the time pt states she has never had a goiter  no longer seeing thyroid Dr     lipids due for check diet-has been ok -- is discouraged about weight  too many sweets -- can stop them   still goes to exercise and takes her portable oxygen    bp is 136/90 first check  Allergies: 1)  ! * Adhesive 2)  ! * Spearmint 3)  ! * Naproxen 4)  ! * Hctz 5)  ! Lescol 6)  ! * Zetia  Past History:  Past Surgical History: Last updated: 04/28/2006 Appendectomy Cholecystectomy GU surgery- bladder tack Hysterectomy Nephrectomy- left Rotator cuff repair Tonsillectomy Right ear surgery X 3 Left leg fracture- as child Abd. US- liver cysts (02/1998) C7 radiculopathy and spondylisis (01/1999) Bone spur surgery (02/1999) Dexa- osteopenia (01/2000) LEEP 906/2002) ENT surgery- septorhinoplasty (06/2003) Nuclear stress test- neg. (01/2006)  Family History: Last updated: 04/28/2006 on disability for psychiatric   Social History: Last updated: 05/18/2007 Patient currently smokes.  on disability for psych married 2 daughters  Risk Factors: Smoking Status: current (04/28/2006)  Past Medical History: COPD- tob abuse Hypertension Hypothyroidism Osteoarthritis- hip/ ankle bipolar kidney ca urinary retention deafness- cochlear  implants bilat hyperlipidemia  macular deg L eye  allergic rhinitis  osteopenia   gyn- Van dalen   Review of Systems General:  Denies fatigue, loss of appetite, and malaise. Eyes:  Denies blurring and eye irritation. CV:  Denies chest pain or discomfort, palpitations, and shortness of breath with exertion. Resp:  Complains of shortness of breath; denies cough and wheezing. GI:  Denies abdominal pain, change in bowel habits, and indigestion. GU:  Complains of urinary hesitancy. MS:  Complains of stiffness; denies muscle aches and cramps. Derm:  Denies itching, lesion(s), poor wound healing, and rash. Neuro:  Denies numbness and tingling. Psych:  mood is fair . Endo:  Denies cold intolerance, excessive thirst, excessive urination, and heat intolerance. Heme:  Denies abnormal bruising and bleeding.  Physical Exam  General:  Well-developed,well-nourished,in no acute distress; alert,appropriate and cooperative throughout examination Head:  normocephalic, atraumatic, and no abnormalities observed.   Eyes:  vision grossly intact, pupils equal, pupils round, and pupils reactive to light.  no conjunctival pallor, injection or icterus  Mouth:  pharynx pink and moist.   Neck:  supple with full rom and no masses or thyromegally, no JVD or carotid bruit  Lungs:  diffusely distant bs with fair air exch and proloned exp time  Heart:  Normal rate and regular rhythm. S1 and S2 normal without gallop, murmur,  click, rub or other extra sounds. Abdomen:  soft, non-tender, normal bowel sounds, no distention, and no masses.  no renal bruits  Msk:  No deformity or scoliosis noted of thoracic or lumbar spine.   Pulses:  R and L carotid,radial,femoral,dorsalis pedis and posterior tibial pulses are full and equal bilaterally Extremities:  no edema today Neurologic:  sensation intact to light touch, gait normal, and DTRs symmetrical and normal.   Skin:  Intact without suspicious lesions or rashes Cervical  Nodes:  No lymphadenopathy noted Psych:  normal affect, talkative and pleasant    Impression & Recommendations:  Problem # 1:  HYPERGLYCEMIA (ICD-790.29) Assessment Unchanged  adv to cut sweets check fasting sugar today Orders: Venipuncture (47425) TLB-Lipid Panel (80061-LIPID) TLB-Renal Function Panel (80069-RENAL) TLB-CBC Platelet - w/Differential (85025-CBCD) TLB-TSH (Thyroid Stimulating Hormone) (84443-TSH) TLB-ALT (SGPT) (84460-ALT) TLB-AST (SGOT) (84450-SGOT) TLB-T4 (Thyrox), Free [phone]) Prescription Created Electronically [phone])  Labs Reviewed: Creat: 0.8 (04/29/2009)     Problem # 2:  TOBACCO USE (ICD-305.1) Assessment: Unchanged  still smoking despite copd pt aware of risks  will continue to think about smoking cessation  Orders: Prescription Created Electronically [phone])  Problem # 3:  HYPOTHYROIDISM (ICD-244.9) Assessment: Unchanged  check tsh and free T4- still cold natured no longer goes to thyroid specialist is compliant with med  Her updated medication list for this problem includes:    Levothroid 100 Mcg Tabs (Levothyroxine sodium) .Marland Kitchen... 1 by mouth once daily  Orders: Venipuncture (66063) TLB-Lipid Panel (80061-LIPID) TLB-Renal Function Panel (80069-RENAL) TLB-CBC Platelet - w/Differential (85025-CBCD) TLB-TSH (Thyroid Stimulating Hormone) (84443-TSH) TLB-ALT (SGPT) (84460-ALT) TLB-AST (SGOT) (84450-SGOT) TLB-T4 (Thyrox), Free [phone]) Prescription Created Electronically [phone])  Labs Reviewed: TSH: 2.603 (01/29/2009)    HgBA1c: 5.3 (04/29/2009) Chol: 199 (04/29/2009)   HDL: 66.50 (04/29/2009)   LDL: 116 (04/29/2009)   TG: 81.0 (04/29/2009)  Problem # 4:  HYPERTENSION (ICD-401.9) Assessment: Unchanged  this is stable on multi drug regimen lab today urged to remain active Her updated medication list for this problem includes:    Benazepril Hcl 20 Mg Tabs (Benazepril hcl) .Marland Kitchen... 1 by mouth once daily    Furosemide 40 Mg Tabs  (Furosemide) .Marland Kitchen... Take 1 tablet by mouth once a day    Lotrel 10-20 Mg Caps (Amlodipine besy-benazepril hcl) .Marland Kitchen... Take 1 tablet by mouth once a day    Benicar 40 Mg Tabs (Olmesartan medoxomil) .Marland Kitchen... Take one by mouth daily  Orders: Venipuncture (22025) TLB-Lipid Panel (80061-LIPID) TLB-Renal Function Panel (80069-RENAL) TLB-CBC Platelet - w/Differential (85025-CBCD) TLB-TSH (Thyroid Stimulating Hormone) (84443-TSH) TLB-ALT (SGPT) (84460-ALT) TLB-AST (SGOT) (84450-SGOT) TLB-T4 (Thyrox), Free [phone]) Prescription Created Electronically [phone])  BP today: 136/90-- re check 130/80 at rest  Prior BP: 138/80 (04/29/2009)  Labs Reviewed: K+: 4.4 (04/29/2009) Creat: : 0.8 (04/29/2009)   Chol: 199 (04/29/2009)   HDL: 66.50 (04/29/2009)   LDL: 116 (04/29/2009)   TG: 81.0 (04/29/2009)  Problem # 5:  COPD (ICD-496) Assessment: Deteriorated  now on continuous 02 which is helpful enc to continue smoking cessation Her updated medication list for this problem includes:    Advair Diskus 250-50 Mcg/dose Misc (Fluticasone-salmeterol) .Marland Kitchen... 1 puff two times a day    Albuterol 90 Mcg/act Aers (Albuterol) .Marland Kitchen... As needed  Orders: Prescription Created Electronically [phone])  Complete Medication List: 1)  Clonazepam 0.5 Mg Tabs (Clonazepam) .Marland Kitchen.. 1 by mouth two times a day 2)  Klor-con 10 10 Meq Tbcr (Potassium chloride) .... Take 1 tablet by mouth three times a day 3)  Benazepril Hcl 20 Mg Tabs (Benazepril  hcl) .... 1 by mouth once daily 4)  Buspar 15 Mg Tabs (Buspirone hcl) .... Take 1 tablet by mouth two times a day 5)  Trazodone Hcl 100 Mg Tabs (Trazodone hcl) .... Take 2 tablets by mouth daily 6)  Trileptal 600 Mg Tabs (Oxcarbazepine) .... 2  tabs by mouth once daily 7)  Furosemide 40 Mg Tabs (Furosemide) .... Take 1 tablet by mouth once a day 8)  Lotrel 10-20 Mg Caps (Amlodipine besy-benazepril hcl) .... Take 1 tablet by mouth once a day 9)  Advair Diskus 250-50 Mcg/dose Misc  (Fluticasone-salmeterol) .Marland Kitchen.. 1 puff two times a day 10)  Albuterol 90 Mcg/act Aers (Albuterol) .... As needed 11)  Flonase 50 Mcg/act Susp (Fluticasone propionate) .Marland Kitchen.. 1 sp each nare two times a day 12)  Centrum Silver Tabs (Multiple vitamins-minerals) .... Take 1 tablet by mouth once a day 13)  Caltrate 600+d 600-400 Mg-unit Tabs (Calcium carbonate-vitamin d) .... Take 1 tablet by mouth daily 14)  Levothroid 100 Mcg Tabs (Levothyroxine sodium) .Marland Kitchen.. 1 by mouth once daily 15)  Icaps Caps (Multiple vitamins-minerals) .... One by mouth daily 16)  Abilify 15 Mg Tabs (Aripiprazole) .... Take one by mouth at night 17)  Cpap Machine  .Marland Kitchen.. 2ml of o2 18)  Flomax 0.4 Mg Xr24h-cap (Tamsulosin hcl) .... Take 1 tablet by mouth once a day 19)  Benicar 40 Mg Tabs (Olmesartan medoxomil) .... Take one by mouth daily 20)  Vitamin B-12 1000 Mcg Tabs (Cyanocobalamin) .... Take 1 tablet by mouth once a day 21)  Tylenol Extra Strength 500 Mg Tabs (Acetaminophen) .... Otc as directed. 22)  One A Day Extra Garlic 1000mg   .... Take 1 tablet by mouth once a day 23)  Fish Oil 1200 Mg Caps (Omega-3 fatty acids) .... Take 1 capsule by mouth once a day 24)  Vitamin D 1000 Unit Tabs (Cholecalciferol) .... Take 1 tablet by mouth once a day 25)  Wellbutrin Xl 300 Mg Xr24h-tab (Bupropion hcl) .... Take 1 tablet by mouth once a day 26)  Oxygen  .... 2l continuously by nasal cannula  Other Orders: Flu Vaccine 36yrs + MEDICARE PATIENTS (C6237) Administration Flu vaccine - MCR (S2831)  Patient Instructions: 1)  labs today  2)  flu shot today  3)  no change in medicines  4)  keep up the good exercise  5)  cut back on the sweets 6)  follow up in 6 months Prescriptions: LEVOTHROID 100 MCG  TABS (LEVOTHYROXINE SODIUM) 1 by mouth once daily  #30 x 11   Entered and Authorized by:   Judith Part MD   Signed by:   Judith Part MD on 10/29/2009   Method used:   [facility]        [facility]  (ret[facility]       [address]       [place], [place]  [address]       Ph: [phone] or [phone]       Fax: [phone]   RxID:   [id number] KLOR-CON 10 10 MEQ  TBCR (POTASSIUM CHLORIDE) Take 1 tablet by mouth three times a day  #30 x 11   Entered and Authorized by:   Judith Part MD   Signed by:   Judith Part MD on 10/29/2009   Method used:   [facility]        [facility] [phone]* (retail)       [address]  [place], [place]  [address]       Ph: [phone] or [phone]       Fax: [phone]   RxID:   [id number] FUROSEMIDE 40 MG  TABS (FUROSEMIDE) Take 1 tablet by mouth once a day  #30 x 11   Entered and Authorized by:   Judith Part MD   Signed by:   Judith Part MD on 10/29/2009   Method used:   [facility]        [facility] (retail)       [address]       [place], [place]  [address]       Ph: [phone] or [phone]       Fax: [phone]   RxID:   [id number] BENAZEPRIL HCL 20 MG  TABS (BENAZEPRIL HCL) 1 by mouth once daily  #30 x 11   Entered and Authorized by:   Judith Part MD   Signed by:   Judith Part MD on 10/29/2009   Method used:   [facility]        [facility] (retail[facility]       [address]       [place], [place]  [address]       Ph: [phone] or [phone]       Fax: [phone]   RxID:   [id number]   Current Allergies (reviewed today): ! * ADHESIVE ! * SPEARMINT ! * NAPROXEN ! * HCTZ ! LESCOL ! * ZETIA     Flu Vaccine Consent Questions     Do you have a history of severe allergic reactions to this vaccine? no    Any prior history of allergic reactions to egg and/or gelatin? no    Do you have a sensitivity to the preservative Thimersol? no    Do you have a past history of Guillan-Barre Syndrome? no    Do you currently have an acute febrile illness? no    Have you ever had a severe reaction to latex? no    Vaccine information  given and explained to patient? yes    Are you currently pregnant? no    Lot Number:[id number]   Exp Date:07/19/2010   Site Given  Right Deltoid IMedflu Lewanda Rife LPN  October 29, 2009 10:55 AM

## 2010-02-18 NOTE — Miscellaneous (Signed)
Summary: Vitamin D 1000iu update  Medications Added VITAMIN D 1000 UNIT  TABS (CHOLECALCIFEROL) Take 1 tablet by mouth once a day       Clinical Lists Changes  Medications: Added new medication of VITAMIN D 1000 UNIT  TABS (CHOLECALCIFEROL) Take 1 tablet by mouth once a day     Current Allergies: ! * ADHESIVE ! * SPEARMINT ! * NAPROXEN ! * HCTZ ! LESCOL ! * ZETIA

## 2010-02-18 NOTE — Therapy (Signed)
Summary: [facility]  [facility]   Imported By: Lanelle Bal 05/22/2009 11:01:21  _____________________________________________________________________  External Attachment:    Type:   Image     Comment:   External Document

## 2010-02-18 NOTE — Letter (Signed)
Summary: [facility]   Imported By: Lanelle Bal 09/05/2009 13:33:20  _____________________________________________________________________  External Attachment:    Type:   Image     Comment:   External Document

## 2010-02-28 ENCOUNTER — Ambulatory Visit: Payer: Self-pay | Admitting: Unknown Physician Specialty

## 2010-03-12 ENCOUNTER — Encounter: Payer: Self-pay | Admitting: Family Medicine

## 2010-04-30 ENCOUNTER — Ambulatory Visit: Payer: Self-pay | Admitting: Family Medicine

## 2010-05-09 ENCOUNTER — Ambulatory Visit: Payer: Self-pay | Admitting: Family Medicine

## 2010-05-23 ENCOUNTER — Ambulatory Visit: Payer: Self-pay | Admitting: Family Medicine

## 2010-05-23 ENCOUNTER — Ambulatory Visit (INDEPENDENT_AMBULATORY_CARE_PROVIDER_SITE_OTHER): Payer: Medicare Other | Admitting: Family Medicine

## 2010-05-23 ENCOUNTER — Encounter: Payer: Self-pay | Admitting: Family Medicine

## 2010-05-23 DIAGNOSIS — Z8639 Personal history of other endocrine, nutritional and metabolic disease: Secondary | ICD-10-CM

## 2010-05-23 DIAGNOSIS — Z862 Personal history of diseases of the blood and blood-forming organs and certain disorders involving the immune mechanism: Secondary | ICD-10-CM

## 2010-05-23 DIAGNOSIS — R7309 Other abnormal glucose: Secondary | ICD-10-CM

## 2010-05-23 DIAGNOSIS — E876 Hypokalemia: Secondary | ICD-10-CM

## 2010-05-23 DIAGNOSIS — I10 Essential (primary) hypertension: Secondary | ICD-10-CM

## 2010-05-23 DIAGNOSIS — E7800 Pure hypercholesterolemia, unspecified: Secondary | ICD-10-CM

## 2010-05-23 DIAGNOSIS — E039 Hypothyroidism, unspecified: Secondary | ICD-10-CM

## 2010-05-23 LAB — CBC WITH DIFFERENTIAL/PLATELET
Basophils Relative: 0 % (ref 0–1)
Eosinophils Absolute: 0.2 10*3/uL (ref 0.0–0.7)
Eosinophils Relative: 3 % (ref 0–5)
Hemoglobin: 14.5 g/dL (ref 12.0–15.0)
Lymphs Abs: 1.9 10*3/uL (ref 0.7–4.0)
MCH: 31.6 pg (ref 26.0–34.0)
MCHC: 33.9 g/dL (ref 30.0–36.0)
MCV: 93.2 fL (ref 78.0–100.0)
Monocytes Relative: 9 % (ref 3–12)
RBC: 4.59 MIL/uL (ref 3.87–5.11)

## 2010-05-23 LAB — COMPREHENSIVE METABOLIC PANEL
BUN: 11 mg/dL (ref 6–23)
CO2: 31 mEq/L (ref 19–32)
Creat: 0.85 mg/dL (ref 0.40–1.20)
Glucose, Bld: 86 mg/dL (ref 70–99)
Total Bilirubin: 0.3 mg/dL (ref 0.3–1.2)

## 2010-05-23 LAB — TSH: TSH: 1.356 u[IU]/mL (ref 0.350–4.500)

## 2010-05-23 LAB — LIPID PANEL: LDL Cholesterol: 124 mg/dL — ABNORMAL HIGH (ref 0–99)

## 2010-05-23 NOTE — Progress Notes (Signed)
  Subjective:    Patient ID: Martha Patton, female    DOB: Jun 04, 1950, 60 y.o.   MRN: [id number]  HPI Here for f/u of chronic med problems- hypothyroid/ hyperlipidemia /HTN / hyperglycemia   Is doing great and feeling great  Wishes she could loose more weight is  up 2 lb [facility] still [facility]   HTN in good control with 130/78 today  Feeling like thyroid is about the same The cold intolerance is improved with warmer weather   trazadone was changed but no others  Mood is good   Trying to quit - trying the electronic cigarette -- with menthol flavor with no nicotine    Past Medical History  Diagnosis Date  . COPD (chronic obstructive pulmonary disease)   . Hypertension   . Thyroid disease   . Osteoarthritis   . Bipolar 1 disorder   . Cancer of kidney   . Urinary retention   . Deafness     cochlear implants bilat  . Hyperlipidemia   . Allergy   . Macular degeneration     left eye  . Osteopenia        Review of Systems Review of Systems  Constitutional: Negative for fever, appetite change, fatigue and unexpected weight change.  Eyes: Negative for pain and visual disturbance.  Respiratory: Negative for cough and shortness of breath.   Cardiovascular: Negative.   Gastrointestinal: Negative for nausea, diarrhea and constipation.  Genitourinary: Negative for urgency and frequency.  Skin: Negative for pallor.  Neurological: Negative for weakness, light-headedness, numbness and headaches.  Hematological: Negative for adenopathy. Does not bruise/bleed easily.  Psychiatric/Behavioral: Negative for dysphoric mood. The patient is not nervous/anxious.         Objective:   Physical Exam  Constitutional: She appears well-developed and well-nourished.  HENT:  Head: Atraumatic.  Eyes: Conjunctivae and EOM are normal. Pupils are equal, round, and reactive to light.  Neck: Normal range of motion. Neck supple. No JVD present. No  thyromegaly present.  Cardiovascular: Normal rate, regular rhythm and normal heart sounds.   Pulmonary/Chest: Effort normal and breath sounds normal.       Diffusely distant bs  No wheeze  Abdominal: Soft. Bowel sounds are normal. She exhibits no distension and no mass. There is no tenderness.  Musculoskeletal: Normal range of motion. She exhibits no edema and no tenderness.  Lymphadenopathy:    She has no cervical adenopathy.  Neurological: She is alert. She displays normal reflexes. No cranial nerve deficit. Coordination normal.  Skin: Skin is warm and dry. No rash noted. No erythema. No pallor.  Psychiatric: She has a normal mood and affect.          Assessment & Plan:

## 2010-05-23 NOTE — Patient Instructions (Signed)
I'm glad you are doing so well Labs today  Keep working on quitting smoking -good job so far Follow up in 6 months

## 2010-05-24 LAB — HEMOGLOBIN A1C
Hgb A1c MFr Bld: 5.1 % (ref <5.7)
Mean Plasma Glucose: 100 mg/dL (ref <117)

## 2010-06-03 NOTE — Assessment & Plan Note (Signed)
[facility]                                 ON-CALL NOTE   NAME:Martha Patton, Martha Patton                          MRN:          [id number]  DATE:01/05/2007                            DOB:          1950-09-14    TIME OF CALL:  6:53 p.m.   CALLER:  Lupita Leash at [facility], [phone].   Of note, the first call came in from [phone] and I could not reach the  pharmacy, but got paged again with the right phone number.  I am the  regular doctor, Dr. Milinda Antis.   CHIEF COMPLAINT:  Medicine did not get called in.  Ms. Mustin was seen by  Willaim Sheng D. Bean, FNP today for high blood pressure and a prescription for  benazepril 20 mg was supposed to be electronically transmitted to the  pharmacy, but it did not get there.  I went ahead and okay'd a  benazepril 20 mg 1 p.o. daily as directed #30 with no refills and told  them to call back if they have any questions.     Marne A. Tower, MD  Electronically Signed    MAT/MedQ  DD: 01/05/2007  DT: 01/06/2007  Job #: [id number]

## 2010-06-06 NOTE — Op Note (Signed)
[place]. [facility]  Patient:    Martha Patton, Martha Patton                        MRN: [id number] Adm. Date:  [id number] Disc. Date: [id number] Attending:  Colon Branch                           Operative Report  PREOPERATIVE DIAGNOSIS:  Dysphagia, secondary to anterior cervical plate displacement; rule out nonunion of previous C6-7 fusion.  POSTOPERATIVE DIAGNOSIS:  PROCEDURE:  Removal of anterior cervical plate, exploration of fusion C6-7.  FINDINGS:  The level appears fused.  SURGEON:  Rollene Rotunda, M.D.  ASSISTANT:  Izell Freeport. Elesa Hacker, M.D.  ANESTHESIA:  General endotracheal tube  ESTIMATED BLOOD LOSS:  Minimal.  DRAINS:  None.  COMPLICATIONS:  None.  INDICATIONS:  The patient is a 60 year old woman who underwent a C6-7 anterior cervical diskectomy earlier this year.  She has had movement of the plate, and as dysphagia, which is worsening.  The patient is brought in for exploration and fusion.  PROCEDURE IN DETAIL:  The patient was brought to the operating room and general  anesthesia was induced.  The patient was prepped and draped in the usual sterile fashion.  At the site of incision, which was the site of the previous surgery in the anterior cervical neck area, was injected with 5 cc of 1% lidocaine with epinephrine.  Incision was then made at the site of the previous incision, and hemostasis obtained with Bovie cauterization. Bovie was used to dissect through  subcutaneous tissue to the platysma, and then we used sharp dissection to go through the last layer of platysma.  Blunt dissection was taken out through the  anterior cervical fascia, in which though there was some scar, was fairly readily dissected down to the anterior cervical bone.  There was a layer of scar over the cervical plate. After making sure we reflected the trachea and the esophagus out of the way, we were able to use sharp dissection to get through that  anterior cervical plate.  Four screws were found in the C5 and 7  - removed. The plate was then removed.  Hemostasis was obtained with bipolar cauterization. Then we evaluated the fusion, and tried to place a spinal needle into the disk space, and were not able to find any soft areas.  Pressing on the level from above and below showed no movement of the area. We did not dissect down to look for hard bone, as we did ot want to disrupt the area or any fusion that was taking place.  Our plan was to continue in cervical collar just a little while longer.  Gelfoam and thrombin was then placed in the area, and then irrigated out with antibiotic solution. We had very good hemostasis.  The platysma was closed with 0 Vicryl interrupted suture, subcutaneous tissue was closed with same.  The skin edges were closed with Dermabond skin glue.  Dressing was placed. Cervical collar was then placed back. The patient was awakened from anesthesia and transferred to recovery room in stable condition. DD:  06/10/99 TD:  06/15/99 Job: [id number]

## 2010-06-06 NOTE — H&P (Signed)
[place]. [facility]  Patient:    Martha Patton                         MRN: [id number] Adm. Date:  [id number] Disc. Date: [id number] Attending:  Colon Branch                         History and Physical  CHIEF COMPLAINT:  Dysphagia.  HISTORY OF PRESENT ILLNESS:  Patient is status post C6-7 anterior cervical diskectomy and fusion and she has had movement or displacement of the superior part of the plate and has been having dysphagia.  Problem has been worsening over time though there has been no new movement of the plate.  She has been wearing a collar and bone growth stimulator to help the fusion come along. She still continues to smoke despite counseling to stop, but, because of the problems with swelling, patient brought in for removal of plate and exploration of fusion.  PAST MEDICAL HISTORY:  High blood pressure.  Depression.  Bipolar disease. Thyroid disease.  Arthritis.  Decreased hearing.  PREVIOUS OPERATIONS:  Include 6-7 anterior diskectomy and fusion.  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  ______ and ovary removed in 1973.  Carpal tunnel right wrist.  Rectal ______ in 1977.  CURRENT MEDICATIONS:  ______, buspirone, Trileptal, Synthroid, Celexa, Norvasc, and clonazepam.  ALLERGIES:  No drug allergies but is allergic to TAPE.  SOCIAL HISTORY:  She does smoke cigarettes, drinks alcohol rarely, is married.  REVIEW OF SYSTEMS:  Otherwise negative.  FAMILY HISTORY:  Noncontributory.  PHYSICAL EXAMINATION:  HEENT:  Unremarkable.  NECK:  Well-healed left side of neck anterior incision.  LUNGS:  Clear.  HEART:  Regular rate and rhythm.  ABDOMEN:  Soft, nontender.  EXTREMITIES:  No edema.  NEUROLOGICAL:  Intact.  DIAGNOSTIC STUDIES:  Review of x-rays of cervical spine show that the anterior aspect of the anterior cervical plate has displaced anteriorly pushing in the esophagus.  ASSESSMENT AND PLAN:  Movement of anterior  plate causing dysphagia.  We will admit for surgery to remove the plate and explore fusion.DD:  06/10/99 TD:  06/10/99 Job: [id number]

## 2010-06-06 NOTE — Discharge Summary (Signed)
[place]. [facility]  Patient:    Martha Patton, Martha Patton Visit Number: [id number] MRN: [id number]          Service Type: MED Location: [phone] Attending Physician:  Katha Cabal Dictated by:   [place] Daphine Deutscher, M.D. Admit Date:  09/28/2000 Discharge Date: 10/02/2000                             Discharge Summary  ADMISSION DIAGNOSIS:  Abdominal evisceration one week out from hysterectomy done at [facility].  HOSPITAL COURSE:  The patient was taken from the ED directly into the operating room for abdominal evisceration with visible bowel. She had this abdominal evisceration closed by me with retention sutures. She was taken upstairs. Because she has recently had surgery and was found to have a renal cell carcinoma, she was worried about getting back over to [facility] to have that procedure performed. She got along fairly well and was ready for discharge on October 02, 2000 by Dr. Ezzard Standing, my partner, who saw her. Arrangements were made for her to follow up with Dr. Bonney Aid [facility] in [place], as well as follow up with myself as needed.  FINAL DIAGNOSES: 1. Status post abdominal evisceration 2. Status post closure. Dictated by:   [place] Daphine Deutscher, M.D. Attending Physician:  Katha Cabal DD:  11/09/00 TD:  11/10/00 Job: [id number]

## 2010-06-06 NOTE — Assessment & Plan Note (Signed)
[facility] OFFICE NOTE   NAME:Parlett, MARGURETTE BRENER                        MRN:          [id number]  DATE:02/17/2006                            DOB:          14-Aug-1950    PRIMARY CARE PHYSICIAN:  Marne A. Tower, MD.   REASON FOR VISIT:  Cardiac followup testing.   HISTORY OF PRESENT ILLNESS:  I saw Ms. Ridings earlier in January.  Her  history is detailed in my previous note.  I referred her for further  cardiac risk stratification, including an exercise Myoview which  revealed no electrocardiographic evidence of ischemia with a baseline  right bundle branch block pattern, and showed an ejection fraction of  77% with normal wall motion, and no perfusion evidence of scar or  ischemia.  She had a resting echocardiogram obtained revealing an  ejection fraction of 55%-65% with no regional wall motion abnormalities,  mildly thickened aortic valve, mild mitral regurgitation, mild left  atrial enlargement, and question of a density in the right atrium.  I  discussed both these tests with the patient and her husband today.  She  continues to have some symptoms of chest heaviness, usually in the  morning, she says when she gets up, sometimes when she is seated later  in the evening, but not consistently with exertion.  We talked about  trying to resume her prior regimen at the [facility], and be very observant of  any changes in symptoms that might prompt additional testing.  She does  have baseline increased risk from a cardiovascular status, given her  family history, hyperlipidemia, and hypertension.  Also, I have asked  her to try an over-the-counter antacid to see if perhaps some of her  symptoms could be due to reflux.  We talked about the echocardiogram,  and in general, densities in the right atrium tend to be either a  prominent eustachian valve or Chiari network, essentially normal  variants. Today we talked about general  strategies for risk factor  modification, natural history of coronary artery disease, and reviewed  her medications.   ALLERGIES:  ADHESIVE TAPE AND SPEARMINT.   PRESENT MEDICATIONS:  1. BuSpar 15 mg p.o. b.i.d.  2. Trazodone 150 mg p.o. daily.  3. Clonazepam 0.5 mg p.o. daily.  4. Wellbutrin 300 mg p.o. daily.  5. Trileptal 600 mg p.o. t.i.d.  6. Cymbalta 60 mg p.o. daily.  7. B12 shots monthly.  8. Chantix 1 mg p.o. b.i.d.  9. Synthroid 150 mcg p.o. daily.  10.Flomax 0.4 mg p.o. daily.  11.Zetia 10 mg p.o. daily.  12.Klor-con 10 mEq p.o. b.i.d.  13.Lasix 40 mg p.o. daily.  14.Lotrel 10/20 mg p.o. daily.  15.Benicar 40 mg p.o. daily.  16.Advair 250/50 b.i.d.  17.Albuterol 90 mcg p.r.n.  18.Flonase 50 mcg p.o. b.i.d.  19.Spiriva 18 mcg daily.  20.Multivitamin 1 p.o. daily.  21.Lovaza 1 capsule b.i.d.  22.Abilify 10 mg p.o. nightly  23.Garlic 1200 mg p.o. daily.  24.Calcium with vitamin D 600 mg p.o. daily.   REVIEW OF SYSTEMS:  As described in the history  of present illness.   PHYSICAL EXAMINATION:  Blood pressure today 158/88, heart rate is 78,  weight is 174.1 pounds.  Patient is comfortable and in no acute distress without active symptoms,  and no significant change in baseline examination.   IMPRESSION AND RECOMMENDATIONS:  1. Right bundle branch block pattern on electrocardiogram with recent      Myoview being low risk and arguing against any major obstructive      coronary artery disease.  Ms. Porche does have significant      cardiovascular risk factors, and we talked about being very      observant for any symptom change, and the importance of medical      therapy for control of hypertension and hyperlipidemia.  She is      also working on smoking cessation.  I asked her to resume her prior      exercise regimen, particularly noting if she has any progressive      symptoms, and if so, we may need to consider further testing      although at this point  observation will be our strategy.  She and      her husband were comfortable with this.  I did not make any      specific medication changes today.  Followup will be in 6 months      assuming she remains stable.  2. Echocardiogram report suggesting question of density in the right      atrium.  I reviewed the study images with Dr. Jens Som and the area      in question is not seen in all views and could be acoustic      artifact.  For now we will plan a follow-up echocardiogram in 3      months with attention to this area.     Jonelle Sidle, MD  Electronically Signed    SGM/MedQ  DD: 02/17/2006  DT: 02/17/2006  Job #: [id number]   cc:   Marne A. Milinda Antis, MD

## 2010-06-06 NOTE — Discharge Summary (Signed)
[place]. [facility]  Patient:    Martha Patton, Martha Patton                        MRN: [id number] Adm. Date:  [id number] Disc. Date: [id number] Attending:  Colon Branch                           Discharge Summary  DIAGNOSIS:  Dysphagia secondary to displacement of anterior cervical plate and rule out nonunion of C6-7 fusion.  POSTOPERATIVE DIAGNOSIS:  Dysphagia secondary to displacement of anterior cervical plate and rule out nonunion of C6-7 fusion, appears to have good fusion.  PROCEDURE:  Removal of anterior cervical plate and exploration of fusion, C6-7.  HISTORY OF PRESENT ILLNESS:  Patient is a 60 year old woman who underwent anterior cervical diskectomy and fusion at C6-7 in January.  She seemed to be doing well and, after removed from collar, started having swallowing problems. New x-ray showed the superior part of the plate had moved anteriorly, was pressing on the esophagus, and she was having intermittent dysphagia.  The symptoms were slowly getting worse over time.  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  She was smoking and continued to do so and was at risk for nonunion.  As the dysphagia continued to worsen to become nontolerable, she was admitted to the floor for removal of the plate and exploration of fusion.  HOSPITAL COURSE:  Patient was admitted on day of surgery and underwent the procedure named above.  Again, the fusion looked good.  Postoperatively, she was transferred to the recovery room and then to the floor.  There, she has been ambulating, in minimal pain, actually eating very well, voice a little hoarse.  She noticed she has a little swelling in her throat, but the feeling and problems with dysphagia she had preoperatively were not present currently. She is eating much better, she reports.  Patient will be discharged home in stable condition.  DISCHARGE MEDICATIONS:  Same as prehospitalization  plus Darvocet-N 100 one to two p.o. q.6h. hours p.r.n.  DIET:  As tolerated.  ACTIVITIES:  Keep C collar.  Use bone growth stimulator as she was using preoperatively.  No strenuous activity.  FOLLOW-UP:  In three weeks in my office. DD:  06/11/99 TD:  06/13/99 Job: [id number]

## 2010-06-06 NOTE — H&P (Signed)
[place]. [facility]  Patient:    Martha Patton, Martha Patton                          MRN: [id number] Adm. Date:  02/13/99 Attending:  Clydene Fake, M.D.                         History and Physical  CHIEF COMPLAINT:  Left arm numbness and pain.  HISTORY OF PRESENT ILLNESS:  Patient is a 60 year old right-handed woman who was having some difficulty swallowing and had a workup including a neck MRI and that suggested some spondylosis of the cervical spine and upon further history-taking, she was found to have left arm pain and numbness.  She underwent an MRI of the cervical spine, in particular, with C-spine x-rays which showed spondylosis at -7, worse on the left side, with foraminal stenosis on x-rays; an MRI concurred with this, showing disc bulging and foraminal narrowing due to the spurring with spondylosis at that left side.  Patient later on had worsening of symptoms over the last month with more pain down her arm into the middle fingers, numbness occasionally on the right side, but always on the left in the same distribution. Activity makes these symptoms worse.  She has been dropping things with her left hand.  PAST MEDICAL HISTORY:  Significant for hypertension, hearing loss, depression, arthritis, hypothyroidism, bipolar disease.  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  CURRENT MEDICATIONS:  BuSpar, hydrochlorothiazide, Trileptal, Magsal, Ambien, Norvasc, naproxen, ______ , Synthroid and clonazepam.  SOCIAL HISTORY:  She smokes one and a half packs a day, drinks alcohol rarely, s married and not employed.  REVIEW OF SYSTEMS:  Review of systems is otherwise negative.  FAMILY HISTORY:  Noncontributory.  PHYSICAL EXAMINATION:  HEENT:  Unremarkable.  LUNGS:  Clear.  HEART:   Regular rate and rhythm.  ABDOMEN:  Soft, nontender.  EXTREMITIES:  Intact.  No edema.  NECK/NEUROLOGICAL:  Exam shows range of motion of the neck is done fairly well.  There is a positive Spurlings to the left and negative ______ or brachial plexus tenderness on the left.  She has 4+/5 strength in the left triceps.  All other muscle groups are 5/5.  Absent triceps weakness and decreased sensation in the eft C7 distribution to pinprick and light touch, otherwise, intact.  Gait normal. Rapid alternating movements are done well.  ASSESSMENT:  Patient with worsening C7 radiculopathy, correlating with her spondylosis and foraminal narrowing.  PLAN:  Patient will be admitted for a CF at 6-7. DD:  02/13/99 TD:  02/13/99 Job: [id number]

## 2010-06-06 NOTE — Assessment & Plan Note (Signed)
[facility]                                   ON-CALL NOTE   NAME:CLAPPFronnie, Urton                        MRN:          [id number]  DATE:10/03/2005                            DOB:          08/24/1950    ON CALL NOTE:  Called on October 03, 2005 at 10:00 a.m.  Mr. Verbeke called  complaining that she has been unable to urinate. The nurse took this phone  call and advised that the patient go to the emergency room.  He stated he  [facility].  He agreed to take her back to be  reevaluated.                                   Lelon Perla, DO   YRL/MedQ  DD:  10/03/2005  DT:  10/05/2005  Job #:  [id number]   cc:   Marne A. Milinda Antis, MD

## 2010-06-06 NOTE — Op Note (Signed)
[place]. [facility]  Patient:    Martha Patton                         MRN: [id number] Proc. Date: 02/13/99 Adm. Date:  [id number] Disc. 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  PROCEDURE:  Anterior cervical diskectomy and fusion C6-7 with allograft, anterior cervical ________ tong traction.  SURGEON:  Clydene Fake, M.D.  ASSISTANT:  Izell Hico. Elesa Hacker, M.D.  ANESTHESIA:  General endotracheal tube anesthesia.  ESTIMATED BLOOD LOSS:  Minimal.  DRAINS:  None.  COMPLICATIONS:  None.  REASON FOR PROCEDURE:  The patient is a 60 year old woman who has had progressive left C7 radiculopathy with numbness, weakness, and pain who has spondylosis at 6-7 with osteophytes and foraminal narrowing.  The patient was brought in for surgery.  PROCEDURE IN DETAIL:  The patient was brought into the operating room. General anesthesia was induced.  The patient was placed in Gardner-Wells tongs nd then prepped and draped in a sterile fashion.  The site of the incision was injected with 8 cc of 1% lidocaine with epinephrine.  The incision was made from the midline to the anterior border of the sternocleidomastoid on the left side f the neck.  The incision taken down to the platysma, and hemostasis was obtained  with Bovie cauterization.  The platysma was incised with the Bovie and then blunt dissection was taken down through the cervical fascia to the anterior cervical spine.  The needle was placed in the disk space, and an x-ray was obtained showing this was the C6-7 interspace.  The disk space was then incised with a #15 blade and pituitary rongeurs used to do partial diskectomy.  The longus colli muscle was hen mobilized on each side with the Bovie and retracted  laterally using the self-retaining retractor system.  Diskectomy was then more completely done. The Cloward drill guide was placed over the disk space, and a 10-mm Cloward drill was used to drill out through the disk space and end plates, leaving a thin layer of posterior cortex.  A microscope was brought into the case at this point, and 1 and 2 mm Kerrison punches were used to complete the diskectomy and remove the thin layer of posterior cortex posterior to the ligaments and then do bilateral foraminotomies, opening up and decompressing nerve roots bilaterally,  especially on the left.  The wound was irrigated with antibiotic solution.  The  depth of the vertebral body was measured, and a 12-mm bone dowel was cut to be  few millimeters shorter in length and then tapped into place with traction on the Gardner-Wells tongs.  We then felt behind the graft, and there was plenty of room between graft and dura.  The wound was irrigated with antibiotic solution.  The  microscope was removed from the case, and a 24-mm small-stature Synthes plate was then placed over the interspace.  Two screws were placed in the C6 and two in the C7.  An x-ray was obtained showing good position of the graft, plate, and screws. It was hard to see the lower screws due to the shoulders being in _______. Locking screws were then placed.  The wound irrigated  with antibiotic solution,  retractors were removed.  Hemostasis was obtained with bipolar cauterization and Gelfoam and thrombin.  The Gelfoam and thrombin was removed and irrigated out.  Then the platysma was closed with 3-0 Vicryl interrupted sutures and the same as the subcutaneous tissue, and then the skin with a running of 4-0 nylon suture.  Dressing was placed.  Gardner-Wells tongs were removed.  The patient was awakened from anesthesia and transferred to the recovery room in stable condition. DD:  02/13/99 TD:  02/13/99 Job:  [id number]

## 2010-06-06 NOTE — Discharge Summary (Signed)
[place]. [facility]  Patient:    Martha Patton                         MRN: [id number] Adm. Date:  [id number] Disc. Date: [id number] Attending:  Colon Branch                           Discharge Summary  DIAGNOSIS:  Spondylosis with left C7 radiculopathy at C6-7.  DISCHARGE DIAGNOSIS:  Spondylosis with left C7 radiculopathy at C6-7.  PROCEDURES:  Anterior cervical diskectomy and fusion at C6-7, allograft anterior cervical ______ traction.  HOSPITAL COURSE:  Patient with progressive left C7 radiculopathy and some triceps weakness, decreased sensation and pain.  MRI shows spondylosis at 6-7 with the eft side foraminal narrowing.  The patient was brought in for surgery.  The patient was admitted the day of surgery and underwent the procedure named above without complications.  Postop, the patient was transferred to the recovery room and then the floor.  Immediately postop, saturations were low in the high 80s with O2. he is a heavy smoker.  With breathing treatments and time, by morning postop she has been off oxygen; saturations between 92-95%.  She is eating well and ambulating  well.  She has decreased arm pain and decreased numbness, though some of that is still present.  No change in motor strength.  She is doing well.  Incision is clean, dry, and intact.  We will discharge home in stable condition.  DISCHARGE MEDICATIONS:  Same as prehospitalization except no Naprosyn and we added Vicodin ES 1-2 p.o. q.4-6h. p.r.n.; 41 pills given.  DIET:  As tolerated.  ACTIVITIES:  Keep incision dry.  Keep ______ on at all times.  No heavy lifting.  FOLLOW-UP:  Follow up with me in 7-10 days for suture removal. DD:  02/14/99 TD:  02/15/99 Job: [id number]

## 2010-06-06 NOTE — Assessment & Plan Note (Signed)
[facility] OFFICE NOTE   NAME:Martha Patton, Martha Patton                        MRN:          [id number]  DATE:01/25/2006                            DOB:          1950-09-11    REFERRING PHYSICIAN:  Marne A. Milinda Antis, M.D.   REASON FOR CONSULTATION:  Abnormal electrocardiogram.   HISTORY OF PRESENT ILLNESS:  Martha Patton is a 60 year old woman with  available history indicating bipolar disorder, hypertension,  hyperlipidemia, ongoing tobacco use with COPD and sleep apnea, and  hearing loss status post cochlear implant.  She is being followed by Dr.  Jeanice Lim in [place] for her psychiatric condition and had an  electrocardiogram obtained as part of a routine evaluation.  This  tracing was obtained back in December and reportedly showed evidence of  left atrial enlargement with right bundle branch block pattern and  possible inferior infarct pattern.  A tracing from October of 2007 also  showed right bundle branch block pattern and left atrial enlargement  with small inferior Q waves.  She is referred today as this apparently  represents a change compared to previous tracings.  Follow-up  electrocardiogram in the office shows similar changes, although, with  technically nondiagnostic inferior Q waves.  Symptomatically, she does  report some recent chest tightness, sometimes in the mornings when she  gets up and sometimes after dinner in the evenings.  She does not have  this necessarily on any typical basis with exertion, although, does have  some at times.  She exercises at the [facility], usually 3 days per week, and  walks up to a mile at a time.  She reports a prior stress test  approximately 5-10 years ago and no antecedent history of myocardial  infarction or coronary artery disease.  She has had no more recent  cardiac testing.   ALLERGIES:  ADHESIVE TAPE and SPEARMINT.   CURRENT MEDICATIONS:  1. Buspirone 15 mg p.o.  b.i.d.  2. Trazodone 150 mg one to two tablets daily.  3. Clonazepam 0.5 mg one to three tablets daily.  4. Wellbutrin XL 300 mg p.o. q.noon.  5. Trileptal 600 mg three tablets daily.  6. Cymbalta 60 mg p.o. daily.  7. Vitamin B12 injections monthly.  8. Amantidine 100 mg p.o. b.i.d.  9. Chantix 1 mg two tablets daily.  10.Synthroid 150 mcg p.o. daily.  11.Flomax 0.4 mg p.o. daily.  12.Zetia 10 mg p.o. daily.  13.Klor-Con 10 mEq two tablets daily.  14.Lasix 40 mg p.o. daily.  15.Lotrel 10/20 mg p.o. daily.  16.Benicar 40 mg p.o. daily.  17.Advair 250/50 b.i.d.  18.Albuterol as needed.  19.Fluticasone 50 mcg each nostril b.i.d.  20.Spiriva 18 mcg daily.  21.Centrum multivitamin.  22.Advair p.r.n.  23.Calcium with vitamin D daily.  24.Garlic 1200 mg daily.  25.Fish Oil tablets 1000 mg daily.   PAST MEDICAL HISTORY:  As outlined in the history of present illness.  Additional problems include previous left nephrectomy for cancer in  October of 2002, hysterectomy, mastoid surgery, cervical disk surgery,  left arm surgery, nasal surgery, septoplasty with conchal  graft,  previously noted positive PPD, and hypothyroidism.   SOCIAL HISTORY:  The patient is married.  She lives in [place] in  [place].  She has a pack per day tobacco use history for 35  years, rarely drinks alcohol.  She has three children.   REVIEW OF SYSTEMS:  As noted in history of present illness.  She has had  no obvious palpitations or syncope, no claudication.  She does have some  left hip pain with ambulation that she attributes to arthritis.   FAMILY HISTORY:  Reviewed and significant for cardiovascular disease in  the patient's father who had a heart attack at age 11 and died at age  28.   PHYSICAL EXAMINATION:  VITAL SIGNS:  Blood pressure is 163/102, heart  rate 78, no weight recorded.  GENERAL:  The patient is in no acute distress, denying any active chest  pain.  HEENT:  Conjunctivae, lids  are normal, evidence of left cochlear implant  with hearing device in place.  Oropharynx clear.  NECK:  Supple without elevated jugular venous pressure or loud bruits.  No thyromegaly.  LUNGS:  Generally clear.  Somewhat diminished breath sounds.  No active  wheezing or labored breathing.  HEART:  Regular rate and rhythm.  No loud systolic murmur or S3 gallop.  No pericardial rub.  ABDOMEN:  Soft, nontender, normal active bowel sounds.  EXTREMITIES:  Trace pitting edema below the knees bilaterally.  SKIN:  Warm and dry.  No obvious ulcerations noted.  MUSCULOSKELETAL:  No kyphosis is noted.  NEUROPSYCHIATRIC:  The patient is alert and oriented x3.   IMPRESSION:  1. Electrocardiogram showing a right bundle branch block pattern which      is presumably new compared to remote tracings.  This is in a 29-      year-old woman with ongoing tobacco use, family history of      cardiovascular disease, hypertension, hyperlipidemia.  She is      manifesting some chest tightness recently, both with typical and      atypical features by description.  We discussed the situation today      and I have recommended proceeding with an exercise Myoview as well      as an echocardiogram for further cardiac risk stratification.  I      will have her      follow up in the office to review the results and we can proceed      from there.  2. Further plans to follow.     Jonelle Sidle, MD  Electronically Signed    SGM/MedQ  DD: 01/25/2006  DT: 01/25/2006  Job #: [id number]   cc:   Marne A. Milinda Antis, MD

## 2010-07-04 ENCOUNTER — Encounter: Payer: Self-pay | Admitting: Cardiovascular Disease

## 2010-07-07 ENCOUNTER — Encounter: Payer: Self-pay | Admitting: Family Medicine

## 2010-07-07 ENCOUNTER — Ambulatory Visit (INDEPENDENT_AMBULATORY_CARE_PROVIDER_SITE_OTHER): Payer: Medicare Other | Admitting: Family Medicine

## 2010-07-07 ENCOUNTER — Ambulatory Visit (INDEPENDENT_AMBULATORY_CARE_PROVIDER_SITE_OTHER)
Admission: RE | Admit: 2010-07-07 | Discharge: 2010-07-07 | Disposition: A | Discharge status: Home / Self Care | Payer: Medicare Other | Source: Ambulatory Visit | Attending: Family Medicine | Admitting: Family Medicine

## 2010-07-07 VITALS — BP 120/72 | HR 58 | Temp 98.6°F | Ht 60.0 in | Wt 139.4 lb

## 2010-07-07 DIAGNOSIS — M79609 Pain in unspecified limb: Secondary | ICD-10-CM

## 2010-07-07 DIAGNOSIS — M79671 Pain in right foot: Secondary | ICD-10-CM

## 2010-07-07 MED ORDER — TRAMADOL HCL 50 MG PO TABS: 50.0000 mg | ORAL_TABLET | Freq: Four times a day (QID) | ORAL | Status: AC | PRN
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
## 2010-07-18 ENCOUNTER — Other Ambulatory Visit: Payer: Self-pay | Admitting: Family Medicine

## 2010-08-17 ENCOUNTER — Other Ambulatory Visit: Payer: Self-pay | Admitting: Family Medicine

## 2010-09-01 ENCOUNTER — Ambulatory Visit (INDEPENDENT_AMBULATORY_CARE_PROVIDER_SITE_OTHER): Payer: Medicare Other | Admitting: Family Medicine

## 2010-09-01 ENCOUNTER — Encounter: Payer: Self-pay | Admitting: Family Medicine

## 2010-09-01 VITALS — BP 116/78 | HR 84 | Temp 98.1°F | Wt 135.0 lb

## 2010-09-01 DIAGNOSIS — R220 Localized swelling, mass and lump, head: Secondary | ICD-10-CM

## 2010-09-01 DIAGNOSIS — R221 Localized swelling, mass and lump, neck: Secondary | ICD-10-CM

## 2010-09-01 NOTE — Assessment & Plan Note (Signed)
1 by 1.5 cm oval mobile tender mass below L ear that feels like a LN  Pt reports recent horsefly bite of earlobe that is just now healing - so that may be the cause No other adenopathy on exam Pt is a smoker and formerly had kidney cancer Will check [place] to confirm what it is and go from there If no resolution- will need to see her ENT to eval further/ poss bx

## 2010-09-01 NOTE — Patient Instructions (Signed)
Use warm compress on sore area of neck  We will schedule ultrasound at check out  Update me if symptoms worsen -- or if fever or other new symptoms  We will call with result
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
## 2010-09-03 ENCOUNTER — Ambulatory Visit: Payer: Self-pay | Admitting: Family Medicine

## 2010-09-03 ENCOUNTER — Ambulatory Visit: Payer: Self-pay | Admitting: Urology

## 2010-09-03 ENCOUNTER — Encounter: Payer: Self-pay | Admitting: Family Medicine

## 2010-09-04 ENCOUNTER — Telehealth: Payer: Self-pay | Admitting: Family Medicine

## 2010-09-04 DIAGNOSIS — R221 Localized swelling, mass and lump, neck: Secondary | ICD-10-CM

## 2010-09-04 NOTE — Telephone Encounter (Signed)
I will do referral  She is hard of hearing - that is probably why she wants to do ref in person

## 2010-09-04 NOTE — Telephone Encounter (Signed)
Message copied by Judy Pimple on Thu Sep 04, 2010  9:34 PM ------      Message from: Patience Musca      Created: Thu Sep 04, 2010  5:28 PM      Regarding: ENT referral       Please see result note.      ----- Message -----         From: Roxy Manns, MD         Sent: 09/04/2010   2:42 PM           To: Yetta Glassman, LPN            Please let pt know that the ultrasound showed area of concern to be a solid mass that does not resemble a typical lymph node       So I want to have her see an ENT doctor to look at it       Let me know if she is in agreement - and I will do the referral       thanks

## 2010-09-18 ENCOUNTER — Telehealth: Payer: Self-pay | Admitting: *Deleted

## 2010-09-18 NOTE — Telephone Encounter (Signed)
Chart opened in error

## 2010-09-19 ENCOUNTER — Ambulatory Visit: Payer: Self-pay | Admitting: Otolaryngology

## 2010-09-30 ENCOUNTER — Ambulatory Visit: Payer: Self-pay | Admitting: Otolaryngology

## 2010-09-30 ENCOUNTER — Telehealth: Payer: Self-pay | Admitting: *Deleted

## 2010-09-30 DIAGNOSIS — I10 Essential (primary) hypertension: Secondary | ICD-10-CM

## 2010-09-30 NOTE — Telephone Encounter (Signed)
[name] from Dr. Ophelia Charter office called asking about the status of form for surgical clearance. She says this was just faxed over on Friday,  I explained to her that you have been out of the office.  Pt is scheduled for a parotidectomy on 10/09/10.

## 2010-10-01 ENCOUNTER — Telehealth: Payer: Self-pay | Admitting: *Deleted

## 2010-10-01 NOTE — Telephone Encounter (Signed)
Left v/m for [name] at Dr Burna Forts office to call back.

## 2010-10-01 NOTE — Telephone Encounter (Signed)
Please see original note for today.[name] at Dr Burna Forts office notified as instructed by telephone to get med clearance appt with Dr Milinda Antis and pulmonologist.[name] will contact pt about setting up appts.

## 2010-10-01 NOTE — Telephone Encounter (Signed)
Patient is scheduled for surgery on 10/09/10.  Martha Patton says she is going to contact the patient to let her know that she needs an appt with Dr. Milinda Antis for the clearance.  If you need to contact [name] again, please do so.

## 2010-10-01 NOTE — Telephone Encounter (Signed)
Needs a visit to get her EKG , etc please  I think she will also need a visit from her pulm doctor

## 2010-10-01 NOTE — Telephone Encounter (Signed)
[name] at Dr Burna Forts office notified as instructed by telephone. Kriste Basque will contact pt to let her know she needs medical clearance appt with Dr Milinda Antis and a pulmonologist.

## 2010-10-02 ENCOUNTER — Telehealth: Payer: Self-pay

## 2010-10-02 NOTE — Telephone Encounter (Signed)
Received EKG done at [facility] 09/30/10. EKG on shelf in Dr Royden Purl in box.

## 2010-10-02 NOTE — Telephone Encounter (Signed)
Nadine at [facility] notified about med clearance appt. Nadine asked to ck with Dr Milinda Antis once we receive EKG from [facility] and see if that is sufficient for clearance and call Nadine back at direct line [phone]. Kriste Basque is out of office sick. Will send this note to Dr Milinda Antis when receive EKG from [facility].

## 2010-10-02 NOTE — Telephone Encounter (Signed)
Pt called to schedule medical clearance appt for surgery on 10/09/10. First available 30 min appt with Dr Milinda Antis is 10/10/10. Scheduled appt 10/10/10 at 12:15 pm. Lyla Son added pt to list if 30 min cancellation will contact pt about sooner appt. Pt said she had EKG at [facility] on 09/30/10. I faxed request to [facility] medical records for copy of EKG to be faxed to Dr Milinda Antis. I left message for [name] at Dr Burna Forts office to call me back to make her aware of situation. Pt is to call Dr Meredeth Ide, pulmonologist for medical clearance appt also and pt will call [name] with that appt info.

## 2010-10-03 NOTE — Telephone Encounter (Signed)
Nadine notified as instructed by telephone.

## 2010-10-03 NOTE — Telephone Encounter (Signed)
Left v/m for Nadine to call back.

## 2010-10-03 NOTE — Telephone Encounter (Signed)
I reviewed her EKG with last EKG and also her last 2 cardiology notes and echo I think based on that I can medically clear her  She has had many surgeries in past with anethesia - and did well  However-- she still needs pulmonary clearance that is most important  I put together some old notes with the new EKG-- if you could get me the clearance form I will fill it out thanks

## 2010-10-03 NOTE — Telephone Encounter (Signed)
Nadine with [facility] will ck with pt about pulmonology clearance. I called pt to notify and to cancel 10/10/10 appt. Pt was at pulmonology med clearance appt according to pt's husband and Mr Buntin said some one had called and changed appt to 10/06/10 and he wants pt to keep that appt to talk with Dr Milinda Antis about pending surgery. Medical clearance form and all info faxed to [facility] Atten Nadine [phone].

## 2010-10-06 ENCOUNTER — Encounter: Payer: Self-pay | Admitting: Family Medicine

## 2010-10-06 ENCOUNTER — Ambulatory Visit (INDEPENDENT_AMBULATORY_CARE_PROVIDER_SITE_OTHER): Payer: Medicare Other | Admitting: Family Medicine

## 2010-10-06 VITALS — BP 100/66 | HR 88 | Temp 98.4°F | Ht 60.0 in | Wt 131.8 lb

## 2010-10-06 DIAGNOSIS — R221 Localized swelling, mass and lump, neck: Secondary | ICD-10-CM

## 2010-10-06 DIAGNOSIS — I10 Essential (primary) hypertension: Secondary | ICD-10-CM

## 2010-10-06 DIAGNOSIS — J449 Chronic obstructive pulmonary disease, unspecified: Secondary | ICD-10-CM

## 2010-10-06 DIAGNOSIS — Z23 Encounter for immunization: Secondary | ICD-10-CM

## 2010-10-06 DIAGNOSIS — F17200 Nicotine dependence, unspecified, uncomplicated: Secondary | ICD-10-CM

## 2010-10-06 DIAGNOSIS — R7309 Other abnormal glucose: Secondary | ICD-10-CM

## 2010-10-06 DIAGNOSIS — E7800 Pure hypercholesterolemia, unspecified: Secondary | ICD-10-CM

## 2010-10-06 DIAGNOSIS — R220 Localized swelling, mass and lump, head: Secondary | ICD-10-CM

## 2010-10-06 LAB — COMPREHENSIVE METABOLIC PANEL
ALT: 12 U/L (ref 0–35)
AST: 14 U/L (ref 0–37)
Albumin: 4.1 g/dL (ref 3.5–5.2)
Calcium: 9.1 mg/dL (ref 8.4–10.5)
Chloride: 98 mEq/L (ref 96–112)
Potassium: 3.9 mEq/L (ref 3.5–5.1)
Total Protein: 6.5 g/dL (ref 6.0–8.3)

## 2010-10-06 LAB — HEMOGLOBIN A1C: Hgb A1c MFr Bld: 5.1 % (ref 4.6–6.5)

## 2010-10-06 LAB — LDL CHOLESTEROL, DIRECT: Direct LDL: 133.8 mg/dL

## 2010-10-06 NOTE — Assessment & Plan Note (Signed)
Needs pulm clearance from Dr Mayo Ao for upcoming surgery Flu shot today  Pt isnot motivated to quit smoking  Signed handicapped form for this due to sob on exertion

## 2010-10-06 NOTE — Patient Instructions (Signed)
Labs today- we will send them to Dr Donetta Potts luck with your upcoming surgery Keep working on quitting smoking You can cancel Friday's appt  I signed your handicapped parking form today

## 2010-10-06 NOTE — Assessment & Plan Note (Signed)
For parotidectomy with ENT on Thursday Is medically cleared - has baseline RBBB- checked out by cardiol in past  Needs pulm clearance most importantly Will be general aneth- no problems in the past

## 2010-10-06 NOTE — Assessment & Plan Note (Signed)
Has handled this well with diet overall  Lab Results  Component Value Date   HGBA1C 5.1 05/23/2010   pt is on psych meds that can inc risk Check sugar and a1c today- copy to Dr Imogene Burn

## 2010-10-06 NOTE — Assessment & Plan Note (Signed)
Labs for oct f/u Send to Dr Imogene Burn as well  Currently no statin Would like LDL under 100 due to smoking  Rev low sat fat diet

## 2010-10-06 NOTE — Assessment & Plan Note (Signed)
Has been well controlled overall without changes Lab today and f/u in oct

## 2010-10-06 NOTE — Assessment & Plan Note (Signed)
Disc in detail risks of smoking and possible outcomes including copd, vascular/ heart disease, cancer , respiratory and sinus infections  Pt voices understanding  Pt not ready to quit her 1/2 ppd at this time
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
## 2010-10-09 ENCOUNTER — Ambulatory Visit: Payer: Self-pay | Admitting: Otolaryngology

## 2010-10-10 ENCOUNTER — Emergency Department: Payer: Self-pay | Admitting: Emergency Medicine

## 2010-10-10 ENCOUNTER — Ambulatory Visit: Payer: Medicare Other | Admitting: Family Medicine

## 2010-11-11 ENCOUNTER — Other Ambulatory Visit: Payer: Self-pay | Admitting: Family Medicine

## 2010-11-11 MED ORDER — POTASSIUM CHLORIDE 10 MEQ PO TBCR: 10.0000 meq | EXTENDED_RELEASE_TABLET | Freq: Three times a day (TID) | ORAL | Status: DC

## 2010-11-11 MED ORDER — LEVOTHYROXINE SODIUM 100 MCG PO TABS: 100.0000 ug | ORAL_TABLET | Freq: Every day | ORAL | Status: DC

## 2010-11-11 MED ORDER — BENAZEPRIL HCL 20 MG PO TABS: 20.0000 mg | ORAL_TABLET | Freq: Every day | ORAL | Status: DC

## 2010-11-11 NOTE — Telephone Encounter (Signed)
[facility] request refill Levothyroxine 100 mcg #30 x 6 and Benzepril 20 mg #0 x 6. Pt already has appt scheduled.

## 2010-11-11 NOTE — Telephone Encounter (Signed)
Will refill electronically  

## 2010-11-24 ENCOUNTER — Ambulatory Visit (INDEPENDENT_AMBULATORY_CARE_PROVIDER_SITE_OTHER): Payer: Medicare Other | Admitting: Family Medicine

## 2010-11-24 ENCOUNTER — Encounter: Payer: Self-pay | Admitting: Family Medicine

## 2010-11-24 VITALS — BP 124/80 | HR 92 | Temp 98.0°F | Ht 61.0 in | Wt 126.8 lb

## 2010-11-24 DIAGNOSIS — E7800 Pure hypercholesterolemia, unspecified: Secondary | ICD-10-CM

## 2010-11-24 DIAGNOSIS — R221 Localized swelling, mass and lump, neck: Secondary | ICD-10-CM

## 2010-11-24 DIAGNOSIS — R220 Localized swelling, mass and lump, head: Secondary | ICD-10-CM

## 2010-11-24 DIAGNOSIS — F17200 Nicotine dependence, unspecified, uncomplicated: Secondary | ICD-10-CM

## 2010-11-24 DIAGNOSIS — I10 Essential (primary) hypertension: Secondary | ICD-10-CM

## 2010-11-24 DIAGNOSIS — R7309 Other abnormal glucose: Secondary | ICD-10-CM

## 2010-11-24 MED ORDER — ATORVASTATIN CALCIUM 10 MG PO TABS: 10.0000 mg | ORAL_TABLET | Freq: Every day | ORAL | Status: DC

## 2010-11-24 NOTE — Assessment & Plan Note (Signed)
bp in fair control at this time  No changes needed  Disc lifstyle change with low sodium diet and exercise   

## 2010-11-24 NOTE — Assessment & Plan Note (Signed)
LDL is in 120a-130s Is open to statin again  Will try generic lipitor Disc goals for lipids and reasons to control them Rev labs with pt Rev low sat fat diet in detail  Schedule fasting labs 6 weeks
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
## 2010-11-24 NOTE — Assessment & Plan Note (Signed)
a1c is 5.1 This seems to no longer be a problem 4 lb wt loss with bmi of 23

## 2010-11-24 NOTE — Patient Instructions (Addendum)
Start generic lipitor and update me if you have any side effect or problems Other labs look good  Avoid red meat/ fried foods/ egg yolks/ fatty breakfast meats/ butter, cheese and high fat dairy/ and shellfish   Schedule fasting labs in 6 weeks Follow up in 6 months with labs prior  Keep up the good work with exercise

## 2010-11-24 NOTE — Assessment & Plan Note (Signed)
Removed and non malignant Thankful for that

## 2010-11-24 NOTE — Assessment & Plan Note (Signed)
Disc in detail risks of smoking and possible outcomes including copd, vascular/ heart disease, cancer , respiratory and sinus infections  Pt voices understanding  Feels better the more she cuts down  Not ready to quit yet

## 2010-12-10 ENCOUNTER — Other Ambulatory Visit: Payer: Self-pay | Admitting: *Deleted

## 2010-12-10 MED ORDER — FUROSEMIDE 40 MG PO TABS: 40.0000 mg | ORAL_TABLET | Freq: Every day | ORAL | Status: DC

## 2010-12-10 NOTE — Telephone Encounter (Signed)
Thanks - will refil for once daily Will refill electronically

## 2010-12-10 NOTE — Telephone Encounter (Signed)
Faxed request from [facility].  Request is for 2 tablets by mouth daily, chart has one daily.  Please advise on correct dose.

## 2010-12-10 NOTE — Telephone Encounter (Signed)
Please call pt to verify dosing -thanks

## 2010-12-10 NOTE — Telephone Encounter (Signed)
Pt's daughter states that pt takes one a day.

## 2010-12-12 ENCOUNTER — Encounter: Payer: Self-pay | Admitting: Family Medicine

## 2010-12-12 ENCOUNTER — Telehealth: Payer: Self-pay | Admitting: *Deleted

## 2010-12-12 NOTE — Telephone Encounter (Signed)
Pt has jury duty on 1/14 and her husband is asking for a letter to excuse her. Note is on your shelf.

## 2010-12-12 NOTE — Telephone Encounter (Signed)
Letter done in IN box 

## 2010-12-12 NOTE — Telephone Encounter (Signed)
Message left on machine at home advising Martha Patton that letter is ready for pick up will be left at front desk.

## 2011-01-05 ENCOUNTER — Other Ambulatory Visit (INDEPENDENT_AMBULATORY_CARE_PROVIDER_SITE_OTHER): Payer: Medicare Other

## 2011-01-05 DIAGNOSIS — E7800 Pure hypercholesterolemia, unspecified: Secondary | ICD-10-CM

## 2011-01-05 LAB — LIPID PANEL
Cholesterol: 157 mg/dL (ref 0–200)
LDL Cholesterol: 78 mg/dL (ref 0–99)
Triglycerides: 59 mg/dL (ref 0.0–149.0)
VLDL: 11.8 mg/dL (ref 0.0–40.0)

## 2011-03-26 ENCOUNTER — Inpatient Hospital Stay (HOSPITAL_COMMUNITY)
Admission: EM | Admit: 2011-03-26 | Discharge: 2011-03-30 | DRG: 871 | Disposition: A | Discharge status: Home / Self Care | Payer: Medicare Other | Attending: Pulmonary Disease | Admitting: Pulmonary Disease

## 2011-03-26 ENCOUNTER — Other Ambulatory Visit: Payer: Self-pay

## 2011-03-26 ENCOUNTER — Encounter (HOSPITAL_COMMUNITY): Payer: Self-pay | Admitting: Emergency Medicine

## 2011-03-26 ENCOUNTER — Emergency Department (HOSPITAL_COMMUNITY): Payer: Medicare Other

## 2011-03-26 DIAGNOSIS — Z9989 Dependence on other enabling machines and devices: Secondary | ICD-10-CM | POA: Insufficient documentation

## 2011-03-26 DIAGNOSIS — J13 Pneumonia due to Streptococcus pneumoniae: Secondary | ICD-10-CM

## 2011-03-26 DIAGNOSIS — Z9981 Dependence on supplemental oxygen: Secondary | ICD-10-CM

## 2011-03-26 DIAGNOSIS — J449 Chronic obstructive pulmonary disease, unspecified: Secondary | ICD-10-CM

## 2011-03-26 DIAGNOSIS — R4182 Altered mental status, unspecified: Secondary | ICD-10-CM

## 2011-03-26 DIAGNOSIS — R0603 Acute respiratory distress: Secondary | ICD-10-CM

## 2011-03-26 DIAGNOSIS — E785 Hyperlipidemia, unspecified: Secondary | ICD-10-CM | POA: Diagnosis present

## 2011-03-26 DIAGNOSIS — M899 Disorder of bone, unspecified: Secondary | ICD-10-CM | POA: Diagnosis present

## 2011-03-26 DIAGNOSIS — Z888 Allergy status to other drugs, medicaments and biological substances status: Secondary | ICD-10-CM

## 2011-03-26 DIAGNOSIS — A419 Sepsis, unspecified organism: Principal | ICD-10-CM | POA: Diagnosis present

## 2011-03-26 DIAGNOSIS — E079 Disorder of thyroid, unspecified: Secondary | ICD-10-CM | POA: Diagnosis present

## 2011-03-26 DIAGNOSIS — G4733 Obstructive sleep apnea (adult) (pediatric): Secondary | ICD-10-CM | POA: Diagnosis present

## 2011-03-26 DIAGNOSIS — H9190 Unspecified hearing loss, unspecified ear: Secondary | ICD-10-CM | POA: Diagnosis present

## 2011-03-26 DIAGNOSIS — R6510 Systemic inflammatory response syndrome (SIRS) of non-infectious origin without acute organ dysfunction: Secondary | ICD-10-CM | POA: Diagnosis present

## 2011-03-26 DIAGNOSIS — R339 Retention of urine, unspecified: Secondary | ICD-10-CM | POA: Diagnosis present

## 2011-03-26 DIAGNOSIS — J189 Pneumonia, unspecified organism: Secondary | ICD-10-CM

## 2011-03-26 DIAGNOSIS — Z85528 Personal history of other malignant neoplasm of kidney: Secondary | ICD-10-CM

## 2011-03-26 DIAGNOSIS — J4489 Other specified chronic obstructive pulmonary disease: Secondary | ICD-10-CM | POA: Diagnosis present

## 2011-03-26 DIAGNOSIS — Z905 Acquired absence of kidney: Secondary | ICD-10-CM

## 2011-03-26 DIAGNOSIS — F17200 Nicotine dependence, unspecified, uncomplicated: Secondary | ICD-10-CM | POA: Diagnosis present

## 2011-03-26 DIAGNOSIS — M1990 Unspecified osteoarthritis, unspecified site: Secondary | ICD-10-CM | POA: Diagnosis present

## 2011-03-26 DIAGNOSIS — J9620 Acute and chronic respiratory failure, unspecified whether with hypoxia or hypercapnia: Secondary | ICD-10-CM

## 2011-03-26 DIAGNOSIS — Z79899 Other long term (current) drug therapy: Secondary | ICD-10-CM

## 2011-03-26 DIAGNOSIS — F319 Bipolar disorder, unspecified: Secondary | ICD-10-CM | POA: Diagnosis present

## 2011-03-26 DIAGNOSIS — Z6825 Body mass index (BMI) 25.0-25.9, adult: Secondary | ICD-10-CM

## 2011-03-26 DIAGNOSIS — H3530 Unspecified macular degeneration: Secondary | ICD-10-CM | POA: Diagnosis present

## 2011-03-26 DIAGNOSIS — I10 Essential (primary) hypertension: Secondary | ICD-10-CM | POA: Diagnosis present

## 2011-03-26 HISTORY — DX: Obstructive sleep apnea (adult) (pediatric): G47.33

## 2011-03-26 HISTORY — DX: Dependence on other enabling machines and devices: Z99.89

## 2011-03-26 LAB — CBC
HCT: 42.2 % (ref 36.0–46.0)
Hemoglobin: 14 g/dL (ref 12.0–15.0)
Hemoglobin: 14.1 g/dL (ref 12.0–15.0)
MCH: 32.4 pg (ref 26.0–34.0)
MCH: 32.2 pg (ref 26.0–34.0)
MCHC: 33.4 g/dL (ref 30.0–36.0)
MCV: 95.4 fL (ref 78.0–100.0)
RBC: 4.32 MIL/uL (ref 3.87–5.11)
RBC: 4.38 MIL/uL (ref 3.87–5.11)

## 2011-03-26 LAB — DIFFERENTIAL
Eosinophils Absolute: 0 10*3/uL (ref 0.0–0.7)
Lymphs Abs: 0.6 10*3/uL — ABNORMAL LOW (ref 0.7–4.0)
Monocytes Relative: 7 % (ref 3–12)
Neutrophils Relative %: 89 % — ABNORMAL HIGH (ref 43–77)

## 2011-03-26 LAB — POCT I-STAT 3, ART BLOOD GAS (G3+)
Acid-Base Excess: 2 mmol/L (ref 0.0–2.0)
Bicarbonate: 28.7 mEq/L — ABNORMAL HIGH (ref 20.0–24.0)
O2 Saturation: 89 %
TCO2: 30 mmol/L (ref 0–100)
pCO2 arterial: 52.3 mmHg — ABNORMAL HIGH (ref 35.0–45.0)
pO2, Arterial: 61 mmHg — ABNORMAL LOW (ref 80.0–100.0)

## 2011-03-26 LAB — COMPREHENSIVE METABOLIC PANEL
Alkaline Phosphatase: 104 U/L (ref 39–117)
BUN: 13 mg/dL (ref 6–23)
Chloride: 99 mEq/L (ref 96–112)
GFR calc Af Amer: >90 mL/min (ref >90)
GFR calc non Af Amer: 90 mL/min — ABNORMAL LOW (ref >90)
Glucose, Bld: 119 mg/dL — ABNORMAL HIGH (ref 70–99)
Potassium: 4.4 mEq/L (ref 3.5–5.1)
Total Bilirubin: 0.5 mg/dL (ref 0.3–1.2)
Total Protein: 7 g/dL (ref 6.0–8.3)

## 2011-03-26 LAB — URINALYSIS, ROUTINE W REFLEX MICROSCOPIC
Bilirubin Urine: NEGATIVE
Ketones, ur: 15 mg/dL — AB
Protein, ur: >300 mg/dL — AB
Urobilinogen, UA: 1 mg/dL (ref 0.0–1.0)

## 2011-03-26 LAB — STREP PNEUMONIAE URINARY ANTIGEN: Strep Pneumo Urinary Antigen: POSITIVE — AB

## 2011-03-26 LAB — CREATININE, SERUM
Creatinine, Ser: 0.76 mg/dL (ref 0.50–1.10)
GFR calc non Af Amer: 89 mL/min — ABNORMAL LOW (ref >90)

## 2011-03-26 LAB — POCT I-STAT 3, VENOUS BLOOD GAS (G3P V)
Bicarbonate: 31.4 mEq/L — ABNORMAL HIGH (ref 20.0–24.0)
O2 Saturation: 63 %
TCO2: 33 mmol/L (ref 0–100)

## 2011-03-26 LAB — URINE MICROSCOPIC-ADD ON

## 2011-03-26 LAB — LIPASE, BLOOD: Lipase: 13 U/L (ref 11–59)

## 2011-03-26 LAB — GLUCOSE, CAPILLARY: Glucose-Capillary: 162 mg/dL — ABNORMAL HIGH (ref 70–99)

## 2011-03-26 MED ORDER — ARIPIPRAZOLE 15 MG PO TABS
15.0000 mg | ORAL_TABLET | Freq: Every day | ORAL | Status: DC
  Administered 2011-03-26 – 2011-03-29 (×4): 15 mg via ORAL
  Filled 2011-03-26 (×5): qty 1

## 2011-03-26 MED ORDER — DEXTROSE 5 % IV SOLN
500.0000 mg | Freq: Once | INTRAVENOUS | Status: AC
  Administered 2011-03-26: 500 mg via INTRAVENOUS
  Filled 2011-03-26 (×2): qty 500

## 2011-03-26 MED ORDER — DEXTROSE 5 % IV SOLN
2.0000 g | INTRAVENOUS | Status: DC
  Filled 2011-03-26: qty 2

## 2011-03-26 MED ORDER — ENOXAPARIN SODIUM 40 MG/0.4ML ~~LOC~~ SOLN
40.0000 mg | SUBCUTANEOUS | Status: DC
  Administered 2011-03-26 – 2011-03-29 (×4): 40 mg via SUBCUTANEOUS
  Filled 2011-03-26 (×5): qty 0.4

## 2011-03-26 MED ORDER — OXCARBAZEPINE 300 MG PO TABS: 600.0000 mg | ORAL_TABLET | Freq: Two times a day (BID) | ORAL | Status: DC

## 2011-03-26 MED ORDER — ALBUTEROL SULFATE (5 MG/ML) 0.5% IN NEBU
5.0000 mg | INHALATION_SOLUTION | Freq: Once | RESPIRATORY_TRACT | Status: AC
Start: 2011-03-26 — End: 2011-03-26
  Administered 2011-03-26: 5 mg via RESPIRATORY_TRACT
  Filled 2011-03-26: qty 1

## 2011-03-26 MED ORDER — KCL IN DEXTROSE-NACL 20-5-0.45 MEQ/L-%-% IV SOLN
INTRAVENOUS | Status: DC
  Administered 2011-03-26: 19:00:00 via INTRAVENOUS
  Administered 2011-03-27: 75 mL/h via INTRAVENOUS
  Filled 2011-03-26 (×3): qty 1000

## 2011-03-26 MED ORDER — BUSPIRONE HCL 15 MG PO TABS
15.0000 mg | ORAL_TABLET | Freq: Two times a day (BID) | ORAL | Status: DC
  Administered 2011-03-26 – 2011-03-30 (×7): 15 mg via ORAL
  Filled 2011-03-26 (×10): qty 1

## 2011-03-26 MED ORDER — METHYLPREDNISOLONE SODIUM SUCC 125 MG IJ SOLR
125.0000 mg | Freq: Once | INTRAMUSCULAR | Status: AC
  Administered 2011-03-26: 125 mg via INTRAVENOUS
  Filled 2011-03-26: qty 2

## 2011-03-26 MED ORDER — LEVOTHYROXINE SODIUM 100 MCG PO TABS
100.0000 ug | ORAL_TABLET | Freq: Every day | ORAL | Status: DC
  Administered 2011-03-26 – 2011-03-30 (×5): 100 ug via ORAL
  Filled 2011-03-26 (×7): qty 1

## 2011-03-26 MED ORDER — NITROGLYCERIN 2 % TD OINT
0.5000 [in_us] | TOPICAL_OINTMENT | Freq: Once | TRANSDERMAL | Status: AC
  Administered 2011-03-26: 0.5 [in_us] via TOPICAL
  Filled 2011-03-26: qty 1

## 2011-03-26 MED ORDER — SODIUM CHLORIDE 0.9 % IV SOLN
Freq: Once | INTRAVENOUS | Status: AC
  Administered 2011-03-26: 12:00:00 via INTRAVENOUS

## 2011-03-26 MED ORDER — IPRATROPIUM BROMIDE 0.02 % IN SOLN
0.5000 mg | Freq: Four times a day (QID) | RESPIRATORY_TRACT | Status: DC
  Administered 2011-03-26 – 2011-03-30 (×15): 0.5 mg via RESPIRATORY_TRACT
  Filled 2011-03-26 (×15): qty 2.5

## 2011-03-26 MED ORDER — BUPROPION HCL ER (XL) 300 MG PO TB24
300.0000 mg | ORAL_TABLET | Freq: Every day | ORAL | Status: DC
  Administered 2011-03-26 – 2011-03-30 (×5): 300 mg via ORAL
  Filled 2011-03-26 (×6): qty 1

## 2011-03-26 MED ORDER — ACETAMINOPHEN 650 MG RE SUPP
650.0000 mg | Freq: Once | RECTAL | Status: AC
Start: 2011-03-26 — End: 2011-03-26
  Administered 2011-03-26: 650 mg via RECTAL
  Filled 2011-03-26: qty 1

## 2011-03-26 MED ORDER — OXCARBAZEPINE 300 MG PO TABS
600.0000 mg | ORAL_TABLET | Freq: Two times a day (BID) | ORAL | Status: DC
  Administered 2011-03-26 – 2011-03-30 (×8): 600 mg via ORAL
  Filled 2011-03-26 (×9): qty 2

## 2011-03-26 MED ORDER — HYDRALAZINE HCL 20 MG/ML IJ SOLN
10.0000 mg | INTRAMUSCULAR | Status: DC | PRN
  Administered 2011-03-29 – 2011-03-30 (×2): 10 mg via INTRAVENOUS
  Filled 2011-03-26 (×3): qty 0.5

## 2011-03-26 MED ORDER — DEXTROSE 5 % IV SOLN
500.0000 mg | INTRAVENOUS | Status: DC
  Filled 2011-03-26: qty 500

## 2011-03-26 MED ORDER — SODIUM CHLORIDE 0.9 % IV SOLN: 250.0000 mL | INTRAVENOUS | Status: DC | PRN

## 2011-03-26 MED ORDER — DEXTROSE 5 % IV SOLN: 1.0000 g | INTRAVENOUS | Status: DC

## 2011-03-26 MED ORDER — ALBUTEROL SULFATE (5 MG/ML) 0.5% IN NEBU
2.5000 mg | INHALATION_SOLUTION | Freq: Four times a day (QID) | RESPIRATORY_TRACT | Status: DC
  Administered 2011-03-26 – 2011-03-30 (×15): 2.5 mg via RESPIRATORY_TRACT
  Filled 2011-03-26 (×15): qty 0.5

## 2011-03-26 MED ORDER — SODIUM CHLORIDE 0.9 % IV BOLUS (SEPSIS)
1000.0000 mL | Freq: Once | INTRAVENOUS | Status: AC
  Administered 2011-03-26: 1000 mL via INTRAVENOUS

## 2011-03-26 MED ORDER — DEXTROSE 5 % IV SOLN
1.0000 g | Freq: Once | INTRAVENOUS | Status: AC
  Administered 2011-03-26: 1 g via INTRAVENOUS
  Filled 2011-03-26: qty 10

## 2011-03-26 NOTE — ED Notes (Signed)
Admitting MD remains at bedside speaking with patient and family.

## 2011-03-26 NOTE — ED Notes (Signed)
Pt tachypneic with RR 28/min, respirations shallow & labored. POX down to 87% on 4L [place], increased to 5 L [place] with little improvement.

## 2011-03-26 NOTE — ED Notes (Signed)
Tolerating off Bipap well, POX >93% on 4L [place]. Respirations continue to be shallow, elevated resp rate.  Will monitor

## 2011-03-26 NOTE — ED Notes (Signed)
Per patient family, patient is mostly independent.  Patient ambulates and toilets regularly at home, but does have "accidents" at least once a day.  Patient is on 02 via [place] at 2-3L at home.   Patient is non-compliant with 02 therapy at home and continues to smoke per family report.

## 2011-03-26 NOTE — ED Notes (Signed)
Per [facility], patient was found by husband this morning with CPAP off and unresponsive.  Husband called EMS.  Patient is 80% deaf per EMS.  Patient is responsive to painful stimuli.  PTA 20 G RAC placed by EMS.  No medications given.

## 2011-03-26 NOTE — ED Notes (Signed)
Patient alert and trying to communicate.  Patient arouses to name.  Patient currently asked for something to drink.  Advised can't have anything at this time.

## 2011-03-26 NOTE — Progress Notes (Signed)
eLink Physician-Brief Progress Note Patient Name: Martha Patton DOB: November 24, 1950 MRN: [id number]  Date of Service  03/26/2011   HPI/Events of Note  Lab and camera care reviw  = urine pneumo antigen positive  - patient on nitro patcc   eICU Interventions  - increase ceftriaxone dosing to 2gm q24h to reflectr icu status  - dc nitro patch  - dc sputum culture    Intervention Category Intermediate Interventions: Infection - evaluation and management;Medication change / dose adjustment  Cylinda Santoli 03/26/2011, 6:23 PM

## 2011-03-26 NOTE — ED Provider Notes (Signed)
History     CSN: [id number]  Arrival date & time 03/26/11  1049   First MD Initiated Contact with Patient 03/26/11 1049      Chief Complaint  Patient presents with  . Altered Mental Status    HPI The patient presents after being found unresponsive by her family.  The patient cannot provide any history of present illness.  When awakened the a sternal rub, she denies complaints. Per EMS, the patient was found not wearing her CPAP, unresponsive.  EMS did not provide medications en route, notes that the patient's vital signs were consistent, though notable for tachycardia, tachypnea throughout transport. Past Medical History  Diagnosis Date  . COPD (chronic obstructive pulmonary disease)   . Hypertension   . Thyroid disease   . Osteoarthritis   . Bipolar 1 disorder   . Cancer of kidney   . Urinary retention   . Deafness     cochlear implants bilat  . Hyperlipidemia   . Allergy   . Macular degeneration     left eye  . Osteopenia     Past Surgical History  Procedure Date  . Appendectomy   . Cholecystectomy   . Abdominal hysterectomy   . Bladder suspension     bladder tack  . Nephrectomy     left  . Rotator cuff repair   . Tonsillectomy   . External ear surgery     x3  . Tibia fracture surgery     as a child  . Liver biopsy 02/1998    cysts  . Leep 2002  . Septoplasty 06/2003    No family history on file.  History  Substance Use Topics  . Smoking status: Current Everyday Smoker  . Smokeless tobacco: Not on file   Comment: For 2 weeks pt has been using electric cigarette.  . Alcohol Use: Not on file    OB History    Grav Para Term Preterm Abortions TAB SAB Ect Mult Living                  Review of Systems  Unable to perform ROS: Unstable vital signs    Allergies  Ezetimibe; Fluvastatin sodium; and Naproxen  Home Medications   Current Outpatient Rx  Name Route Sig Dispense Refill  . ACETAMINOPHEN 500 MG PO TABS Oral Take 500 mg by mouth every 6  (six) hours as needed.      . ALBUTEROL 90 MCG/ACT IN AERS Inhalation Inhale 2 puffs into the lungs every 6 (six) hours as needed.      Marland Kitchen AMLODIPINE BESY-BENAZEPRIL HCL 10-20 MG PO CAPS  TAKE 1 CAPSULE BY MOUTH EVERY DAY 30 capsule 11  . ARIPIPRAZOLE 15 MG PO TABS Oral Take 15 mg by mouth daily.      . ATORVASTATIN CALCIUM 10 MG PO TABS Oral Take 1 tablet (10 mg total) by mouth daily. 30 tablet 11  . BENAZEPRIL HCL 20 MG PO TABS Oral Take 1 tablet (20 mg total) by mouth daily. 30 tablet 6  . BENICAR 40 MG PO TABS  TAKE 1 TABLET BY MOUTH DAILY 30 tablet 11  . BUPROPION HCL ER (XL) 300 MG PO TB24 Oral Take 150 mg by mouth daily.     . BUSPIRONE HCL 15 MG PO TABS Oral Take 15 mg by mouth 2 (two) times daily.      Marland Kitchen CALTRATE 600 PLUS-VIT D PO Oral Take by mouth daily.      Marland Kitchen VITAMIN D 1000 UNITS  PO CAPS Oral Take 1,000 Units by mouth daily.      Marland Kitchen CLONAZEPAM 0.5 MG PO TABS Oral Take 0.5 mg by mouth 2 (two) times daily as needed.      Marland Kitchen FLUTICASONE PROPIONATE 50 MCG/ACT NA SUSP Nasal 1 spray by Nasal route 2 (two) times daily.      Marland Kitchen FLUTICASONE-SALMETEROL 250-50 MCG/DOSE IN AEPB Inhalation Inhale 2 puffs into the lungs.      . FUROSEMIDE 40 MG PO TABS Oral Take 1 tablet (40 mg total) by mouth daily. 90 tablet 3  . GARLIC 1000 MG PO CAPS Oral Take by mouth.      Marland Kitchen LEVOTHYROXINE SODIUM 100 MCG PO TABS Oral Take 1 tablet (100 mcg total) by mouth daily. 30 tablet 6  . CENTRUM SILVER PO Oral Take by mouth daily.      . ICAPS PO Oral Take by mouth.      Marland Kitchen FISH OIL 1200 MG PO CAPS Oral Take by mouth.      . OXCARBAZEPINE 600 MG PO TABS Oral Take 300 mg by mouth daily.     Marland Kitchen POTASSIUM CHLORIDE 10 MEQ PO TBCR Oral Take 1 tablet (10 mEq total) by mouth 3 (three) times daily. 90 tablet 6  . TAMSULOSIN HCL 0.4 MG PO CAPS Oral Take 0.4 mg by mouth daily.     . TRAZODONE HCL 100 MG PO TABS Oral Take 300 mg by mouth at bedtime.     Marland Kitchen VITAMIN B-12 1000 MCG PO TABS Oral Take 1,000 mcg by mouth daily.        BP  116/77  Pulse 101  Resp 31  SpO2 95%  LMP 01/20/2000  Physical Exam  Nursing note and vitals reviewed. Constitutional: She appears distressed.  HENT:  Head: Atraumatic.  Eyes: Conjunctivae are normal.       The patient opened eyes briefly to painful stimuli.  Cardiovascular: Normal pulses.  Tachycardia present.   Pulmonary/Chest: No stridor. Tachypnea noted. She is in respiratory distress. She has decreased breath sounds. She has wheezes.       Patient desats into the 80's when on room air. (transitioning to BiPaP)  Abdominal: There is no tenderness.       Smells like urine  Musculoskeletal: She exhibits edema.  Neurological:       The patient awakens to painful stimuli.  She has noted significant hearing loss, but does respond to loud noises as well. She moves all extremities spontaneously, but minimally.  Skin: Skin is warm. She is diaphoretic.  Psychiatric: She has a normal mood and affect.    ED Course  Procedures (including critical care time)   Labs Reviewed  CBC  DIFFERENTIAL  COMPREHENSIVE METABOLIC PANEL  LIPASE, BLOOD  TROPONIN I  URINALYSIS, ROUTINE W REFLEX MICROSCOPIC   No results found.   No diagnosis found.  Pulse oximetry 90% on BiPAP, abnormal Cardiac monitor 110 sinus tach abnormal Chest x-ray reviewed by me - PNA   Date: 03/26/2011  Rate: 96  Rhythm: normal sinus rhythm  QRS Axis: normal  Intervals: normal  ST/T Wave abnormalities: nonspecific T wave changes  Conduction Disutrbances:right bundle branch block and left posterior fascicular block  Narrative Interpretation:   Old EKG Reviewed: changes noted  ABNORMAL  MDM  This 61 year old female presenting respiratory distress after being found unresponsive.  Per my initial evaluation the patient was tachypneic, hypoxic, tachycardic, not only to painful stimuli.  Immediately after the patient was started on BiPAP provided steroids, albuterol  treatment.  Given the patient's history of COPD,  there is concern for hypercarbia.  The patient's initial labs are notable for a mild leukocytosis and her x-ray demonstrated right upper lobe pneumonia.  The patient improved slightly following EEG interventions, which included fluids additional nebulizer treatments, frequently assessment.  Given the patient's lack of significant improvement, concern for pneumonia in this female with significant comorbidities, she was admitted to the ICU. CRITICAL CARE Performed by: Gerhard Munch   Total critical care time: 35  Critical care time was exclusive of separately billable procedures and treating other patients.  Critical care was necessary to treat or prevent imminent or life-threatening deterioration.  Critical care was time spent personally by me on the following activities: development of treatment plan with patient and/or surrogate as well as nursing, discussions with consultants, evaluation of patient's response to treatment, examination of patient, obtaining history from patient or surrogate, ordering and performing treatments and interventions, ordering and review of laboratory studies, ordering and review of radiographic studies, pulse oximetry and re-evaluation of patient's condition.         Gerhard Munch, MD 03/26/11 [phone]

## 2011-03-26 NOTE — H&P (Addendum)
Name: Martha Patton MRN: [id number] DOB: Dec 17, 1950 PCP - Dr. Roxy Manns [facility] [place])    LOS: 0  PCCM ADMISSION NOTE  History of Present Illness: 61 yo female active smoker with hx COPD on home O2, OSA on cpap, bipolar, renal ca who presented 03/26/2011 via EMS after being found unresponsive by family.  Per family she was lethargic throughout the day prior to admit. Then found am of 3/7 in bed off cpap, unresponsive.  In ER remained minimally responsive and was noted to be tachycardic and tachypneic.  Pt was placed on Bipap in ER and PCCM called to admit.   Lines / Drains: none  Cultures: Urine strep Ag 3/7: POS Urine legionella Ag 3/7:  BC x 2 3/7>>> Urine 3/7>>> Sputum 3/7>>>  Antibiotics: Rocephin 3/7>>> Azithro 3/7>>>  Tests / Events:    Past Medical History  Diagnosis Date  . COPD (chronic obstructive pulmonary disease)   . Hypertension   . Thyroid disease   . Osteoarthritis   . Bipolar 1 disorder   . Cancer of kidney   . Urinary retention   . Deafness     cochlear implants bilat  . Hyperlipidemia   . Allergy   . Macular degeneration     left eye  . Osteopenia   . Obstructive sleep apnea on CPAP    Past Surgical History  Procedure Date  . Appendectomy   . Cholecystectomy   . Abdominal hysterectomy   . Bladder suspension     bladder tack  . Nephrectomy     left  . Rotator cuff repair   . Tonsillectomy   . External ear surgery     x3  . Tibia fracture surgery     as a child  . Liver biopsy 02/1998    cysts  . Leep 2002  . Septoplasty 06/2003  . Cervical disc surgery    Prior to Admission medications   Medication Sig Start Date End Date Taking? Authorizing Provider  albuterol (PROVENTIL,VENTOLIN) 90 MCG/ACT inhaler Inhale 2 puffs into the lungs every 6 (six) hours as needed. For shortness of breath   Yes Historical Provider, MD  amLODipine-benazepril (LOTREL) 10-20 MG per capsule  07/18/10  Yes Roxy Manns, MD  ARIPiprazole (ABILIFY) 15  MG tablet Take 15 mg by mouth at bedtime.    Yes Historical Provider, MD  atorvastatin (LIPITOR) 10 MG tablet Take 1 tablet (10 mg total) by mouth daily. 11/24/10 11/24/11 Yes Roxy Manns, MD  benazepril (LOTENSIN) 20 MG tablet Take 1 tablet (20 mg total) by mouth daily. 11/11/10  Yes Roxy Manns, MD  BENICAR 40 MG tablet TAKE 1 TABLET BY MOUTH DAILY 08/17/10  Yes Roxy Manns, MD  buPROPion (WELLBUTRIN XL) 300 MG 24 hr tablet Take 300 mg by mouth daily.    Yes Historical Provider, MD  busPIRone (BUSPAR) 15 MG tablet Take 15 mg by mouth 2 (two) times daily.     Yes Historical Provider, MD  clonazePAM (KLONOPIN) 0.5 MG tablet Take 0.5 mg by mouth 2 (two) times daily as needed. For nerves   Yes Historical Provider, MD  fluticasone (FLONASE) 50 MCG/ACT nasal spray 1 spray by Nasal route 2 (two) times daily.     Yes Historical Provider, MD  Fluticasone-Salmeterol (ADVAIR DISKUS) 250-50 MCG/DOSE AEPB Inhale 2 puffs into the lungs 2 (two) times daily.    Yes Historical Provider, MD  furosemide (LASIX) 40 MG tablet Take 1 tablet (40 mg total) by mouth daily. 12/10/10  Yes Marne  Tower, MD  ibuprofen (ADVIL,MOTRIN) 200 MG tablet Take 200-400 mg by mouth every 6 (six) hours as needed. For pain   Yes Historical Provider, MD  levothyroxine (SYNTHROID, LEVOTHROID) 100 MCG tablet Take 1 tablet (100 mcg total) by mouth daily. 11/11/10  Yes Roxy Manns, MD  Multiple Vitamins-Minerals (CENTRUM SILVER PO) Take 1 tablet by mouth daily.    Yes Historical Provider, MD  Multiple Vitamins-Minerals (ICAPS PO) Take 1 tablet by mouth daily.    Yes Historical Provider, MD  oxcarbazepine (TRILEPTAL) 600 MG tablet Take 600 mg by mouth 2 (two) times daily.    Yes Historical Provider, MD  potassium chloride (KLOR-CON) 10 MEQ CR tablet Take 10 mEq by mouth 3 (three) times daily. 11/11/10  Yes Roxy Manns, MD  Tamsulosin HCl (FLOMAX) 0.4 MG CAPS Take 0.4 mg by mouth daily.    Yes Historical Provider, MD  traZODone (DESYREL) 100 MG  tablet Take 150 mg by mouth at bedtime.    Yes Historical Provider, MD   Allergies Allergies  Allergen Reactions  . Ezetimibe     REACTION: elevated lfts  . Fluvastatin Sodium     REACTION: elevated LFT's  . Naproxen     REACTION: GI    Family History No family history on file.  Social History  reports that she has been smoking Cigarettes.  She has a 40 pack-year smoking history. She does not have any smokeless tobacco history on file. She reports that she drinks alcohol. She reports that she does not use illicit drugs.  Review Of Systems  Unable, pt minimally responsive, unable to answer questions.   Vital Signs: Temp:  [100.6 F (38.1 C)-101.3 F (38.5 C)] 101.1 F (38.4 C) (03/07 1400) Pulse Rate:  [87-101] 87  (03/07 1400) Resp:  [18-31] 24  (03/07 1400) BP: (106-130)/(67-77) 106/68 mmHg (03/07 1400) SpO2:  [95 %-100 %] 96 % (03/07 1400) FiO2 (%):  [50 %] 50 % (03/07 1400)    Physical Examination: General: acutely and chronically ill appearing female Neuro: lethargic, conversant as long as hearing aids are functional, MAEs CV: s1s2 rrr, no m/r/g PULM: resps even non labored on bipap, lungs RUL coarse, No wheeze GI: NABS, soft, NT Extremities: warm, no C/C/E  Labs and Imaging:   CBC    Component Value Date/Time   WBC 13.5* 03/26/2011 1055   RBC 4.32 03/26/2011 1055   HGB 14.0 03/26/2011 1055   HCT 41.2 03/26/2011 1055   PLT 186 03/26/2011 1055   MCV 95.4 03/26/2011 1055   MCH 32.4 03/26/2011 1055   MCHC 34.0 03/26/2011 1055   RDW 13.0 03/26/2011 1055   LYMPHSABS 0.6* 03/26/2011 1055   MONOABS 0.9 03/26/2011 1055   EOSABS 0.0 03/26/2011 1055   BASOSABS 0.0 03/26/2011 1055    BMET    Component Value Date/Time   NA 138 03/26/2011 1055   K 4.4 03/26/2011 1055   CL 99 03/26/2011 1055   CO2 31 03/26/2011 1055   GLUCOSE 119* 03/26/2011 1055   BUN 13 03/26/2011 1055   CREATININE 0.74 03/26/2011 1055   CREATININE 0.85 05/23/2010 1713   CALCIUM 9.5 03/26/2011 1055   GFRNONAA 90* 03/26/2011 1055     GFRAA >90 03/26/2011 1055     ABG    Component Value Date/Time   HCO3 31.4* 03/26/2011 1204   TCO2 33 03/26/2011 1204   O2SAT 63.0 03/26/2011 1204    Dg Chest Port 1 View  03/26/2011  *RADIOLOGY REPORT*  Clinical Data: 61 year old female with weakness  and shortness of breath.  PORTABLE CHEST - 1 VIEW  Comparison: None.  Findings: Portable AP view 1120 hours.  Right upper lobe consolidation.  No definite abnormal pulmonary opacity elsewhere. Cardiac size mildly enlarged. Other mediastinal contours are within normal limits.  Visualized tracheal air column is within normal limits.  No pneumothorax or effusion. Degenerative osseous changes at the left shoulder.  IMPRESSION: Right upper lobe consolidation/pneumonia. Post-treatment radiographs are recommended to document resolution. If there is no evidence of pneumonia clinically, chest CT with IV contrast would be recommended.  Original Report Authenticated By: Harley Hallmark, M.D.      Assessment and Plan:  Acute on chronic resp failure -- multifactorial in PNA with underlying COPD.  PLAN -  Supplemental O2 BD - nebs, use flutter as ms allows  Cont PRN bipap for now abx for CAP - see below F/u CXR    ID --> PNA/sepsis, not septic shock and no organ dysfunction.  Lactate neg.  PLAN -  Rocephin, azithro Pan culture  Urine strep, legionella Ag's   AMS -- hard to determine with ?malfunctioning cochlear implant PLAN -  Monitor, supportive care    Hx HTN - now borderline BP PLAN -  Hold home anti htn for now PRN hydralazine   Best practices / Disposition: -->ICU status under PCCM -->full code -->SCD's for DVT Px -->Protonix for GI Px -->family updated at bedside   [facility], NP 03/26/2011  2:15 PM Pager: [phone]  Pt seen and examined and database reviewed. I agree with above findings, assessment and plan  Billy Fischer, MD;  PCCM service; [place] [phone]

## 2011-03-26 NOTE — ED Notes (Signed)
Abnormal labs where given to RN to give to the DR

## 2011-03-26 NOTE — ED Notes (Signed)
2109-01 Ready 

## 2011-03-26 NOTE — ED Notes (Signed)
Family at bedside with patient.

## 2011-03-26 NOTE — ED Notes (Signed)
Respirations labored, shallow & pt tachypneic with RR 27/min. Pt reports she's tired, SOB & working hard to breathe. Requested to be placed back on BiPAP. RT notified & aware of drop in POX & pt request.

## 2011-03-26 NOTE — ED Notes (Signed)
Patient taken off BIPAP by MD.  Patient currently on [place].

## 2011-03-26 NOTE — ED Notes (Signed)
Care transferred and report given to [facility], [place].

## 2011-03-26 NOTE — ED Notes (Signed)
Admitting MD at bedside.

## 2011-03-27 ENCOUNTER — Inpatient Hospital Stay (HOSPITAL_COMMUNITY): Payer: Medicare Other

## 2011-03-27 LAB — CBC
HCT: 33.6 % — ABNORMAL LOW (ref 36.0–46.0)
Hemoglobin: 11.3 g/dL — ABNORMAL LOW (ref 12.0–15.0)
MCHC: 33.6 g/dL (ref 30.0–36.0)
MCV: 95.7 fL (ref 78.0–100.0)
RDW: 13.2 % (ref 11.5–15.5)

## 2011-03-27 LAB — BASIC METABOLIC PANEL
BUN: 14 mg/dL (ref 6–23)
Creatinine, Ser: 0.7 mg/dL (ref 0.50–1.10)
GFR calc Af Amer: >90 mL/min (ref >90)
GFR calc non Af Amer: >90 mL/min (ref >90)
Glucose, Bld: 117 mg/dL — ABNORMAL HIGH (ref 70–99)
Potassium: 4.2 mEq/L (ref 3.5–5.1)

## 2011-03-27 LAB — GLUCOSE, CAPILLARY
Glucose-Capillary: 118 mg/dL — ABNORMAL HIGH (ref 70–99)
Glucose-Capillary: 146 mg/dL — ABNORMAL HIGH (ref 70–99)
Glucose-Capillary: 68 mg/dL — ABNORMAL LOW (ref 70–99)

## 2011-03-27 LAB — LEGIONELLA ANTIGEN, URINE

## 2011-03-27 MED ORDER — AZITHROMYCIN 500 MG PO TABS
500.0000 mg | ORAL_TABLET | ORAL | Status: DC
  Administered 2011-03-27 – 2011-03-29 (×3): 500 mg via ORAL
  Filled 2011-03-27 (×4): qty 1

## 2011-03-27 MED ORDER — DEXTROSE 5 % IV SOLN
1.0000 g | INTRAVENOUS | Status: DC
  Administered 2011-03-27 – 2011-03-29 (×3): 1 g via INTRAVENOUS
  Filled 2011-03-27 (×4): qty 10

## 2011-03-27 MED ORDER — ACETAMINOPHEN 160 MG/5ML PO SOLN: 650.0000 mg | ORAL | Status: DC | PRN

## 2011-03-27 MED ORDER — ACETAMINOPHEN 325 MG PO TABS
650.0000 mg | ORAL_TABLET | ORAL | Status: DC | PRN
  Administered 2011-03-27: 650 mg via ORAL
  Filled 2011-03-27: qty 2

## 2011-03-27 NOTE — Progress Notes (Signed)
UR Completed.  Vangie Bicker [phone] 03/27/2011

## 2011-03-27 NOTE — Progress Notes (Signed)
eLink Physician-Brief Progress Note Patient Name: Martha Patton DOB: 05-Sep-1950 MRN: [id number]  Date of Service  03/27/2011   HPI/Events of Note   Fever, lft wnl  eICU Interventions  tylenal   Intervention Category Minor Interventions: Routine modifications to care plan (e.g. PRN medications for pain, fever)  Nelda Bucks. 03/27/2011, 5:21 PM

## 2011-03-27 NOTE — Clinical Documentation Improvement (Signed)
CHANGE MENTAL STATUS DOCUMENTATION CLARIFICATION   THIS DOCUMENT IS NOT A PERMANENT PART OF THE MEDICAL RECORD  TO RESPOND TO THE THIS QUERY, FOLLOW THE INSTRUCTIONS BELOW:  1. If needed, update documentation for the patient's encounter via the notes activity.  2. Access this query again and click edit on the In [employer].  3. After updating, or not, click F2 to complete all highlighted (required) fields concerning your review. Select "additional documentation in the medical record" OR "no additional documentation provided".  4. Click Sign note button.  5. The deficiency will fall out of your In Basket *Please let us know if you are not able to complete this workflow by phone or e-mail (listed below).  Please respond with additional documentation in a progress note or dc summary, if appropriate. Thanks.          03/27/11  Dear Dr. Sung Amabile Marton Redwood  In an effort to better capture your patient's severity of illness, reflect appropriate length of stay and utilization of resources, a review of the patient medical record has revealed the following indicators. Based on your clinical judgment, please clarify and document in a progress note and/or discharge summary the clinical condition associated with the following supporting information: In responding to this query please exercise your independent judgment.  The fact that a query is asked, does not imply that any particular answer is desired or expected.  Possible Clinical Conditions?    Encephalopathy (describe type if known)                      Septic                      Metabolic                      Toxic  Acute delirium   Hypoxemia / Hypoxia  Other Condition  Cannot Clinically Determine   Supporting Information: Risk Factors: chronically ill: copd with home oxygen; sirs,   Signs & Symptoms: unresponsive; minimally responsive; The patient awakens to painful stimuli. She has noted significant hearing loss, but does respond  to loud noises as well. She moves all extremities spontaneously, but minimally. . Tachypnea noted. She is in respiratory distress. She has decreased breath sounds. She has wheezes. Patient desats into the 80's when on room air.  Diagnostics: Lab: positive strept pneu, urine: positive nitrate, many bacteria  Radiology: IMPRESSION: Right upper lobe consolidation/pneumonia.   Treatment: azithromycin, rocephin, bipap  Reviewed:  no additional documentation provided  Thank You,  Amada Kingfisher RN, BSN, CCM  Clinical Documentation Specialist: [phone] [email or web address]  Health Information Management [place]

## 2011-03-27 NOTE — Progress Notes (Signed)
PHARMACY - CRITICAL CARE PROGRESS NOTE  PMH: smoker, COPD/OSA (has O2 and CPAP at home), renal CA s/p L nephrectomy, dyslip, HTN, urinary retention, thyroid dz, osteoarthritis, bipolar, deafness (bilat cochlear implants), mac degeneration (L eye), OSA on CPAP.   61 y.o. female smoker with COPD/OSA (home O2 and CPAP), bipolar dz, and RCC s/p L nephrectomy to ED via EMS after family found her down yest AM off her bed without CPAP on. In ED: minimally responsive, high HR, and high RR.  Anticoag: love40, Hgb 11.3 from 14.1 2/2 IVF? - watch  Infectious Dz: CAP, urine strep Ag+. D2 CTX and Azith. WBC 13.6, Tm/24h 101.3, CXR RUL consolidation. CTX dose is 2g IV q24h.  Cx etc: 3/7 urine legionella Ag >> 3/7 blood >> ngtd 3/7 UA +nitrites, neg leukocytes - denies diff/pain urinating 3/7 urine strep Ag +  CV: trops neg, HR 74-101, RR 18-31. PRN hydral.  Endocrine: CBGs 118-162, synthroid 100. ?TSH  GI/Nutrition: NPO  Neuro: A&O, pleasant. Resumed home abilify, buspar, wellbutrin, oxcarbazepine  Nephro: D5 1/2NS + K20 75cc/h. UOP 615/24h, 0.53mL/kg/h.   Pulm: BiPAP>>Logan>>RA this AM, alb and ipra nebs q6  Heme/Onc: RCC s/p nephrectomy. H/H 11.3/33.6, plt wnl 171  PTA Meds: trazodone 150 qHS, benicar, lasix, flomax, lipitor, i-caps, MTV, flonase, advair diskus not resumed   Best Prax: love 40.  Plan/Recommendations: 1. Will change IV azithromycin to PO, per pharmacy IV to PO P&T protocol. 2. Consider decreasing Rocephin dose to 1g IV q24h for CAP dosing, no evidence of CNS infection, pt not obese. 3. Consider ordering TSH - pt on Synthroid  Zoe Lan, PharmD pgr [phone] 03/27/2011, 11:37 AM

## 2011-03-27 NOTE — Progress Notes (Signed)
Pt profile: Admitted 3/7 with hx of O2 dependent COPD, ongoing smoking, severe hearing deficits s/p bilateral cochlear implants, bipolar D/O, acute RUL PNA (prob pneumococcus - urine Ag positive)  Active problems: CAP COPD Bipolar D/O   Subj: Looks much better. No new complaints. No distress. Did not require NPPV overnight  Obj: Filed Vitals:   03/27/11 1600  BP: 147/83  Pulse: 112  Temp: 101.7 F (38.7 C)  Resp: 28    Gen: Chronically ill, NAD HEENT: B hearing aids Neck: no JVD Chest: R upper anterior rhonchi, otherwise clear without wheeze Cardiac:  RRR s M Abd: NABS, soft Ext:  No C/C/E  BMET    Component Value Date/Time   NA 138 03/27/2011 0430   K 4.2 03/27/2011 0430   CL 103 03/27/2011 0430   CO2 27 03/27/2011 0430   GLUCOSE 117* 03/27/2011 0430   BUN 14 03/27/2011 0430   CREATININE 0.70 03/27/2011 0430   CREATININE 0.85 05/23/2010 1713   CALCIUM 8.7 03/27/2011 0430   GFRNONAA >90 03/27/2011 0430   GFRAA >90 03/27/2011 0430    CBC    Component Value Date/Time   WBC 13.6* 03/27/2011 0430   RBC 3.51* 03/27/2011 0430   HGB 11.3* 03/27/2011 0430   HCT 33.6* 03/27/2011 0430   PLT 171 03/27/2011 0430   MCV 95.7 03/27/2011 0430   MCH 32.2 03/27/2011 0430   MCHC 33.6 03/27/2011 0430   RDW 13.2 03/27/2011 0430   LYMPHSABS 0.6* 03/26/2011 1055   MONOABS 0.9 03/26/2011 1055   EOSABS 0.0 03/26/2011 1055   BASOSABS 0.0 03/26/2011 1055    CXR:  RUL AS dz  IMPRESSION: 1) CAP - likely pneumococcus (positive urine antigen). Clinically improved. Cont Ceftrx/Azithro  2) COPD - No acute bronchospasm. Cont nebs. Systemic steroids not indicated. Needs further counseling re: smoking cessation  3) Bipolar D/O - Controlled on home regimen. Cont Same  4) Severe hearing deficit, S/P cochlear implants -    Transfer to reg bed. Begin diet. D/C Foley, NSL IV.  Billy Fischer, MD;  PCCM service; [place] [phone]

## 2011-03-27 NOTE — Progress Notes (Signed)
eLink Physician-Brief Progress Note Patient Name: Martha Patton DOB: [birth year] MRN: [id number]  Date of Service  03/27/2011   HPI/Events of Note   On CPAP at home  eICU Interventions  Order for CPAP   Intervention Category Minor Interventions: Routine modifications to care plan (e.g. PRN medications for pain, fever)  Adama Ivins 03/27/2011, 10:06 PM

## 2011-03-28 DIAGNOSIS — J13 Pneumonia due to Streptococcus pneumoniae: Secondary | ICD-10-CM

## 2011-03-28 DIAGNOSIS — J449 Chronic obstructive pulmonary disease, unspecified: Secondary | ICD-10-CM

## 2011-03-28 MED ORDER — GUAIFENESIN ER 600 MG PO TB12
1200.0000 mg | ORAL_TABLET | Freq: Two times a day (BID) | ORAL | Status: DC
  Administered 2011-03-28 – 2011-03-30 (×5): 1200 mg via ORAL
  Filled 2011-03-28 (×6): qty 2

## 2011-03-28 NOTE — Progress Notes (Signed)
Pt profile: Admitted 3/7 with hx of O2 dependent COPD, ongoing smoking, severe hearing deficits s/p bilateral cochlear implants, bipolar D/O, acute RUL PNA (prob pneumococcus - urine Ag positive)  Active problems: CAP COPD Bipolar   Subj: Looks much better. No new complaints. No distress. Did not require NPPV overnight   Obj: Filed Vitals:   03/28/11 0600  BP: 168/99  Pulse: 100  Temp: 98 F (36.7 C)  Resp: 20   Physical Exam:  Gen: Chronically ill, NAD HEENT: B hearing aids Neck: no JVD Chest: R upper anterior rhonchi, otherwise clear without wheeze Cardiac:  RRR s M Abd: NABS, soft Ext:  No C/C/E  BMET    Component Value Date/Time   NA 138 03/27/2011 0430   K 4.2 03/27/2011 0430   CL 103 03/27/2011 0430   CO2 27 03/27/2011 0430   GLUCOSE 117* 03/27/2011 0430   BUN 14 03/27/2011 0430   CREATININE 0.70 03/27/2011 0430   CREATININE 0.85 05/23/2010 1713   CALCIUM 8.7 03/27/2011 0430   GFRNONAA >90 03/27/2011 0430   GFRAA >90 03/27/2011 0430   CBC    Component Value Date/Time   WBC 13.6* 03/27/2011 0430   RBC 3.51* 03/27/2011 0430   HGB 11.3* 03/27/2011 0430   HCT 33.6* 03/27/2011 0430   PLT 171 03/27/2011 0430   MCV 95.7 03/27/2011 0430   MCH 32.2 03/27/2011 0430   MCHC 33.6 03/27/2011 0430   RDW 13.2 03/27/2011 0430   LYMPHSABS 0.6* 03/26/2011 1055   MONOABS 0.9 03/26/2011 1055   EOSABS 0.0 03/26/2011 1055   BASOSABS 0.0 03/26/2011 1055   IMAGING:  CXR 3/8 >> IMPRESSION:  Right upper and right lower lobe infiltrates.  CXR 3/7 >>      IMPRESSION: 1) CAP - likely pneumococcus (positive urine antigen). Clinically improved. Cont Ceftrx/Azithro 3/9 >> blood cultures no growth so far... Added Mucinex.  2) COPD - No acute bronchospasm. Cont nebs. Systemic steroids not indicated. Needs further counseling re: smoking cessation > on NEBS Q6H w/ Albut & Ipratrop; Lovenox SQ.Marland KitchenMarland Kitchen  3) Bipolar D/O - Controlled on home regimen. Cont Same > on Abilify, Wellbutrin, Buspar, Trileptal per home  regimen...  4) Severe hearing deficit, S/P cochlear implants -    Ellaree Gear M. Kriste Basque, MD 03/28/11 - 9:15AM

## 2011-03-29 DIAGNOSIS — J13 Pneumonia due to Streptococcus pneumoniae: Secondary | ICD-10-CM

## 2011-03-29 DIAGNOSIS — J449 Chronic obstructive pulmonary disease, unspecified: Secondary | ICD-10-CM

## 2011-03-29 NOTE — Progress Notes (Signed)
Pt profile: Admitted 3/7 with hx of O2 dependent COPD, ongoing smoking, severe hearing deficits s/p bilateral cochlear implants, bipolar D/O, acute RUL PNA (prob pneumococcus - urine Ag positive)  Active problems: CAP >> pos Pneumococcal Uag; neg blood cultures so far... COPD Bipolar   Subj: Looks much better. No new complaints. No distress. Did not require NPPV overnight   Obj: Filed Vitals:   03/29/11 0545  BP: 178/99  Pulse: 90  Temp: 98.6 F (37 C)  Resp: 20   Physical Exam:  Gen: Chronically ill, NAD HEENT: B hearing aids Neck: no JVD Chest: R upper anterior rhonchi, otherwise clear without wheeze Cardiac:  RRR s M Abd: NABS, soft Ext:  No C/C/E  BMET    Component Value Date/Time   NA 138 03/27/2011 0430   K 4.2 03/27/2011 0430   CL 103 03/27/2011 0430   CO2 27 03/27/2011 0430   GLUCOSE 117* 03/27/2011 0430   BUN 14 03/27/2011 0430   CREATININE 0.70 03/27/2011 0430   CREATININE 0.85 05/23/2010 1713   CALCIUM 8.7 03/27/2011 0430   GFRNONAA >90 03/27/2011 0430   GFRAA >90 03/27/2011 0430   CBC    Component Value Date/Time   WBC 13.6* 03/27/2011 0430   RBC 3.51* 03/27/2011 0430   HGB 11.3* 03/27/2011 0430   HCT 33.6* 03/27/2011 0430   PLT 171 03/27/2011 0430   MCV 95.7 03/27/2011 0430   MCH 32.2 03/27/2011 0430   MCHC 33.6 03/27/2011 0430   RDW 13.2 03/27/2011 0430   LYMPHSABS 0.6* 03/26/2011 1055   MONOABS 0.9 03/26/2011 1055   EOSABS 0.0 03/26/2011 1055   BASOSABS 0.0 03/26/2011 1055   IMAGING:  CXR 3/8 >> IMPRESSION:  Right upper and right lower lobe infiltrates.  CXR 3/7 >>      IMPRESSION: 1) CAP - likely pneumococcus (positive urine antigen). Clinically improved. Cont Ceftrx/Azithro 3/9 >> blood cultures no growth so far;  Added Mucinex. 3/10>> blood cultures remain neg;  Will order f/u CXR & labs for am 3/11  2) COPD - No acute bronchospasm. Cont nebs. Systemic steroids not indicated. Needs further counseling re: smoking cessation > on NEBS Q6H w/ Albut & Ipratrop; Lovenox  SQ.Marland KitchenMarland Kitchen  3) Bipolar D/O - Controlled on home regimen. Cont Same > on Abilify, Wellbutrin, Buspar, Trileptal per home regimen...  4) Severe hearing deficit, S/P cochlear implants -    Martha Donnellan M. Kriste Basque, MD 03/29/11 - 9:15AM

## 2011-03-30 ENCOUNTER — Inpatient Hospital Stay (HOSPITAL_COMMUNITY): Payer: Medicare Other

## 2011-03-30 DIAGNOSIS — I10 Essential (primary) hypertension: Secondary | ICD-10-CM | POA: Diagnosis present

## 2011-03-30 DIAGNOSIS — J189 Pneumonia, unspecified organism: Secondary | ICD-10-CM | POA: Diagnosis present

## 2011-03-30 LAB — CBC
MCH: 31.8 pg (ref 26.0–34.0)
MCHC: 34.1 g/dL (ref 30.0–36.0)
MCV: 93.3 fL (ref 78.0–100.0)
Platelets: 253 10*3/uL (ref 150–400)
RBC: 3.87 MIL/uL (ref 3.87–5.11)

## 2011-03-30 LAB — BASIC METABOLIC PANEL
CO2: 29 mEq/L (ref 19–32)
Calcium: 9 mg/dL (ref 8.4–10.5)
Creatinine, Ser: 0.54 mg/dL (ref 0.50–1.10)
GFR calc non Af Amer: >90 mL/min (ref >90)
Sodium: 135 mEq/L (ref 135–145)

## 2011-03-30 MED ORDER — GUAIFENESIN ER 600 MG PO TB12: 1200.0000 mg | ORAL_TABLET | Freq: Two times a day (BID) | ORAL | Status: DC

## 2011-03-30 MED ORDER — POTASSIUM CHLORIDE CRYS ER 20 MEQ PO TBCR
40.0000 meq | EXTENDED_RELEASE_TABLET | Freq: Once | ORAL | Status: AC
  Administered 2011-03-30: 40 meq via ORAL
  Filled 2011-03-30 (×3): qty 2

## 2011-03-30 MED ORDER — MOXIFLOXACIN HCL 400 MG PO TABS: 400.0000 mg | ORAL_TABLET | Freq: Every day | ORAL | Status: AC

## 2011-03-30 NOTE — Discharge Summary (Signed)
Physician Discharge Summary  Patient ID: Martha Patton MRN: [id number] DOB/AGE: January 15, 1951 61 y.o.  Admit date: 03/26/2011 Discharge date: 03/30/2011    Discharge Diagnoses:  Active Problems:  BIPOLAR AFFECTIVE DISORDER  COPD  Obstructive sleep apnea on CPAP  CAP (community acquired pneumonia)  HTN (hypertension)   Brief Summary: Martha Patton is a 61 yo female active smoker with hx COPD on home O2, OSA on cpap, bipolar, renal ca who presented 03/26/2011 via EMS after being found unresponsive by family. Per family she was lethargic throughout the day prior to admit. Then found am of 3/7 in bed off cpap, unresponsive. In ER remained minimally responsive and was noted to be tachycardic and tachypneic. Pt was placed on Bipap in ER and PCCM called to admit.   Hospital Course by Discharge Summary 1. CAP - RUL and RLL. Likely pneumococcal (urine ag +).  Clinically much improved.  Treated initially with IV rocephin, azithromycin and will be transitioned to PO Avelox on d/c.  Sputum and blood cultures neg to date and final reports still pending. Still with significant infiltrate on Xray but this appears to be improving and may be lagging behind clinical improvement. She has ambulated in hallway without difficulty on baseline 5L O2.  She will f/u with her pulmonologist at [facility] this week with f/u CXR.  2. COPD - Was rx as inpt with nebulized BD, no steroids indicated.  Will be transitioned back to home rx with Advair, PRN BD at d/c and f.u with primary pulmonary.  Need to continue smoking cessation! 3. Bipolar - Cont home rx 4. HTN - Initially BP was borderline and home rx held, now creeping up.  Will resume home rx and she will f/u next week with her PCP.  5. OSA - Cont home cpap.   Lines / Drains:  none   Cultures:  Urine strep Ag 3/7: POS  Urine legionella Ag 3/7:  BC x 2 3/7>>> ngtd>>> Urine 3/7>>>  Sputum 3/7>>>   Antibiotics:  Rocephin 3/7>>> 3/11 Azithro 3/7>>>3/11 Avelox  3/11>>>  Discharge Exam: General: pleasant female, NAD Neuro: awake and alert, MAE CV: s1s2 rrr, no m/r/g PULM: resps even non labored on baseline 5L [place], diminished R base, few scattered ronchi GI: abd soft, +bs Extremities:  Warm and dry, no edema   Discharge Labs  BMET  Lab 03/30/11 0654 03/27/11 0430 03/26/11 1437 03/26/11 1055  NA 135 138 -- 138  K 3.4* 4.2 -- --  CL 96 103 -- 99  CO2 29 27 -- 31  GLUCOSE 117* 117* -- 119*  BUN 5* 14 -- 13  CREATININE 0.54 0.70 0.76 0.74  CALCIUM 9.0 8.7 -- 9.5  MG -- -- -- --  PHOS -- -- -- --     CBC   Lab 03/30/11 0654 03/27/11 0430 03/26/11 1437  HGB 12.3 11.3* 14.1  HCT 36.1 33.6* 42.2  WBC 7.6 13.6* 13.0*  PLT 253 171 187      Follow-up Information    Follow up with Roxy Manns, MD on 04/06/2011. (12:00 noon )    Contact information:   [address] [place] [address], [place] [address] [phone]       Follow up with Ned Clines, MD on 04/01/2011. (9:30am  xray      10:00am Dr. Meredeth Ide)    Contact information:   [address] [place] [address] [phone]            Liana Crocker  M  Home Medication Instructions [id number]   Printed on:03/30/11 1038  Medication Information                    ARIPiprazole (ABILIFY) 15 MG tablet Take 15 mg by mouth at bedtime.            Fluticasone-Salmeterol (ADVAIR DISKUS) 250-50 MCG/DOSE AEPB Inhale 2 puffs into the lungs 2 (two) times daily.            albuterol (PROVENTIL,VENTOLIN) 90 MCG/ACT inhaler Inhale 2 puffs into the lungs every 6 (six) hours as needed. For shortness of breath           busPIRone (BUSPAR) 15 MG tablet Take 15 mg by mouth 2 (two) times daily.             Multiple Vitamins-Minerals (CENTRUM SILVER PO) Take 1 tablet by mouth daily.            clonazePAM (KLONOPIN) 0.5 MG tablet Take 0.5 mg by mouth 2 (two) times daily as needed. For nerves           Tamsulosin HCl (FLOMAX) 0.4  MG CAPS Take 0.4 mg by mouth daily.            fluticasone (FLONASE) 50 MCG/ACT nasal spray 1 spray by Nasal route 2 (two) times daily.             Multiple Vitamins-Minerals (ICAPS PO) Take 1 tablet by mouth daily.            traZODone (DESYREL) 100 MG tablet Take 150 mg by mouth at bedtime.            oxcarbazepine (TRILEPTAL) 600 MG tablet Take 600 mg by mouth 2 (two) times daily.            buPROPion (WELLBUTRIN XL) 300 MG 24 hr tablet Take 300 mg by mouth daily.            BENICAR 40 MG tablet TAKE 1 TABLET BY MOUTH DAILY           benazepril (LOTENSIN) 20 MG tablet Take 1 tablet (20 mg total) by mouth daily.           levothyroxine (SYNTHROID, LEVOTHROID) 100 MCG tablet Take 1 tablet (100 mcg total) by mouth daily.           atorvastatin (LIPITOR) 10 MG tablet Take 1 tablet (10 mg total) by mouth daily.           furosemide (LASIX) 40 MG tablet Take 1 tablet (40 mg total) by mouth daily.           ibuprofen (ADVIL,MOTRIN) 200 MG tablet Take 200-400 mg by mouth every 6 (six) hours as needed. For pain           amLODipine-benazepril (LOTREL) 10-20 MG per capsule            guaiFENesin (MUCINEX) 600 MG 12 hr tablet Take 2 tablets (1,200 mg total) by mouth 2 (two) times daily.           moxifloxacin (AVELOX) 400 MG tablet Take 1 tablet (400 mg total) by mouth daily.             Disposition: Home  Discharged Condition: Martha Patton has met maximum benefit of inpatient care and is medically stable and cleared for discharge.  Patient is pending follow up as above.      Time spent on disposition:  Greater than 35 minutes.   Signed:  Danford Bad, NP 03/30/2011  10:44 AM Pager: [phone]  *Care during the described time interval was provided by me and/or other providers on the critical care team. I have reviewed this patient's available data, including medical history, events of note, physical examination and test results as part of my  evaluation.

## 2011-04-01 LAB — CULTURE, BLOOD (ROUTINE X 2)
Culture  Setup Time: 201303071805
Culture: NO GROWTH

## 2011-04-06 ENCOUNTER — Ambulatory Visit: Payer: Medicare Other | Admitting: Family Medicine

## 2011-04-07 ENCOUNTER — Ambulatory Visit: Payer: Medicare Other | Admitting: Family Medicine

## 2011-04-29 ENCOUNTER — Ambulatory Visit (INDEPENDENT_AMBULATORY_CARE_PROVIDER_SITE_OTHER): Payer: Medicare Other | Admitting: Family Medicine

## 2011-04-29 ENCOUNTER — Encounter: Payer: Self-pay | Admitting: Family Medicine

## 2011-04-29 VITALS — BP 114/60 | HR 85 | Temp 98.0°F | Ht 61.0 in | Wt 127.5 lb

## 2011-04-29 DIAGNOSIS — J13 Pneumonia due to Streptococcus pneumoniae: Secondary | ICD-10-CM | POA: Insufficient documentation

## 2011-04-29 DIAGNOSIS — Z23 Encounter for immunization: Secondary | ICD-10-CM

## 2011-04-29 DIAGNOSIS — I10 Essential (primary) hypertension: Secondary | ICD-10-CM

## 2011-04-29 DIAGNOSIS — J189 Pneumonia, unspecified organism: Secondary | ICD-10-CM

## 2011-04-29 DIAGNOSIS — F17200 Nicotine dependence, unspecified, uncomplicated: Secondary | ICD-10-CM
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
## 2011-04-29 NOTE — Assessment & Plan Note (Signed)
bp was down in hospital - was temp off meds- now back on regular med schedule  bp good today - no changes  Will continue to follow- has appt next mo  Will do her zostavax then if covered

## 2011-04-29 NOTE — Assessment & Plan Note (Signed)
Strep pneumo pneumonia - that is now resolved in a  Smoker- getting her energy level back Has close f/u with pulmonary  Re assuring exam  hosp records and studies rev in detail  Enc pt to quit smoking

## 2011-04-29 NOTE — Assessment & Plan Note (Signed)
See assessment for CAP Doing better  Pneumovax update today

## 2011-04-29 NOTE — Patient Instructions (Signed)
Pneumonia vaccine today  You can get a shingles vaccine after a month if your insurance covers/ if you want one  If you are interested in a shingles/zoster vaccine - call your insurance to check on coverage,( you should not get it within 1 month of other vaccines) , then call us for a prescription  for it to take to a pharmacy that gives the shot    Take care of yourself  Work hard to quit smoking

## 2011-04-29 NOTE — Assessment & Plan Note (Signed)
Disc in detail risks of smoking and possible outcomes including copd, vascular/ heart disease, cancer , respiratory and sinus infections  Pt voices understanding Not yet ready to quit - even in light of copd and recent CAP Is cutting down thankfully

## 2011-05-24 ENCOUNTER — Telehealth: Payer: Self-pay | Admitting: Family Medicine

## 2011-05-24 DIAGNOSIS — E7800 Pure hypercholesterolemia, unspecified: Secondary | ICD-10-CM

## 2011-05-24 DIAGNOSIS — I10 Essential (primary) hypertension: Secondary | ICD-10-CM

## 2011-05-24 DIAGNOSIS — R7309 Other abnormal glucose: Secondary | ICD-10-CM

## 2011-05-24 DIAGNOSIS — E039 Hypothyroidism, unspecified: Secondary | ICD-10-CM

## 2011-05-24 DIAGNOSIS — M899 Disorder of bone, unspecified: Secondary | ICD-10-CM

## 2011-05-24 DIAGNOSIS — Z8639 Personal history of other endocrine, nutritional and metabolic disease: Secondary | ICD-10-CM

## 2011-05-24 DIAGNOSIS — M949 Disorder of cartilage, unspecified: Secondary | ICD-10-CM

## 2011-05-24 DIAGNOSIS — E876 Hypokalemia: Secondary | ICD-10-CM

## 2011-05-24 NOTE — Telephone Encounter (Signed)
Message copied by Judy Pimple on Sun May 24, 2011  6:50 PM ------      Message from: Alvina Chou      Created: Thu May 21, 2011  9:19 AM      Regarding: Labs for May 6       Lab orders , please

## 2011-05-25 ENCOUNTER — Other Ambulatory Visit (INDEPENDENT_AMBULATORY_CARE_PROVIDER_SITE_OTHER): Payer: Medicare Other

## 2011-05-25 DIAGNOSIS — R7309 Other abnormal glucose: Secondary | ICD-10-CM

## 2011-05-25 DIAGNOSIS — E039 Hypothyroidism, unspecified: Secondary | ICD-10-CM

## 2011-05-25 DIAGNOSIS — M949 Disorder of cartilage, unspecified: Secondary | ICD-10-CM

## 2011-05-25 DIAGNOSIS — Z8639 Personal history of other endocrine, nutritional and metabolic disease: Secondary | ICD-10-CM

## 2011-05-25 DIAGNOSIS — E876 Hypokalemia: Secondary | ICD-10-CM

## 2011-05-25 DIAGNOSIS — Z862 Personal history of diseases of the blood and blood-forming organs and certain disorders involving the immune mechanism: Secondary | ICD-10-CM

## 2011-05-25 DIAGNOSIS — E7800 Pure hypercholesterolemia, unspecified: Secondary | ICD-10-CM

## 2011-05-25 DIAGNOSIS — I10 Essential (primary) hypertension: Secondary | ICD-10-CM

## 2011-05-25 DIAGNOSIS — M899 Disorder of bone, unspecified: Secondary | ICD-10-CM

## 2011-05-25 LAB — TSH: TSH: 2.13 u[IU]/mL (ref 0.35–5.50)

## 2011-05-25 LAB — LIPID PANEL
LDL Cholesterol: 63 mg/dL (ref 0–99)
Total CHOL/HDL Ratio: 2

## 2011-05-25 LAB — COMPREHENSIVE METABOLIC PANEL
AST: 16 U/L (ref 0–37)
Albumin: 4 g/dL (ref 3.5–5.2)
Alkaline Phosphatase: 76 U/L (ref 39–117)
Calcium: 9.2 mg/dL (ref 8.4–10.5)
Chloride: 103 mEq/L (ref 96–112)
Potassium: 4.1 mEq/L (ref 3.5–5.1)
Sodium: 143 mEq/L (ref 135–145)
Total Protein: 6.5 g/dL (ref 6.0–8.3)

## 2011-05-25 LAB — HEMOGLOBIN A1C: Hgb A1c MFr Bld: 4.9 % (ref 4.6–6.5)

## 2011-05-25 LAB — CBC WITH DIFFERENTIAL/PLATELET
Eosinophils Absolute: 0.1 10*3/uL (ref 0.0–0.7)
Eosinophils Relative: 2.4 % (ref 0.0–5.0)
Lymphocytes Relative: 32 % (ref 12.0–46.0)
MCHC: 33.4 g/dL (ref 30.0–36.0)
MCV: 96.9 fl (ref 78.0–100.0)
Monocytes Absolute: 0.4 10*3/uL (ref 0.1–1.0)
Neutrophils Relative %: 56.3 % (ref 43.0–77.0)
Platelets: 183 10*3/uL (ref 150.0–400.0)
RBC: 4.2 Mil/uL (ref 3.87–5.11)
WBC: 4.6 10*3/uL (ref 4.5–10.5)

## 2011-05-26 LAB — VITAMIN D 25 HYDROXY (VIT D DEFICIENCY, FRACTURES): Vit D, 25-Hydroxy: 39 ng/mL (ref 30–89)

## 2011-05-29 ENCOUNTER — Ambulatory Visit: Payer: Self-pay | Admitting: Specialist

## 2011-05-29 LAB — CREATININE, SERUM
Creatinine: 0.68 mg/dL (ref 0.60–1.30)
EGFR (Non-African Amer.): >60

## 2011-06-01 ENCOUNTER — Ambulatory Visit (INDEPENDENT_AMBULATORY_CARE_PROVIDER_SITE_OTHER): Payer: Medicare Other | Admitting: Family Medicine

## 2011-06-01 ENCOUNTER — Encounter: Payer: Self-pay | Admitting: Family Medicine

## 2011-06-01 VITALS — BP 130/80 | HR 66 | Temp 97.9°F | Ht 61.0 in | Wt 126.2 lb

## 2011-06-01 DIAGNOSIS — E7800 Pure hypercholesterolemia, unspecified: Secondary | ICD-10-CM

## 2011-06-01 DIAGNOSIS — F17200 Nicotine dependence, unspecified, uncomplicated: Secondary | ICD-10-CM

## 2011-06-01 DIAGNOSIS — M899 Disorder of bone, unspecified: Secondary | ICD-10-CM

## 2011-06-01 DIAGNOSIS — I10 Essential (primary) hypertension: Secondary | ICD-10-CM

## 2011-06-01 DIAGNOSIS — M949 Disorder of cartilage, unspecified: Secondary | ICD-10-CM

## 2011-06-01 MED ORDER — LEVOTHYROXINE SODIUM 100 MCG PO TABS: 100.0000 ug | ORAL_TABLET | Freq: Every day | ORAL | Status: DC

## 2011-06-01 MED ORDER — AMLODIPINE BESY-BENAZEPRIL HCL 10-20 MG PO CAPS: 1.0000 | ORAL_CAPSULE | Freq: Every day | ORAL | Status: DC

## 2011-06-01 NOTE — Assessment & Plan Note (Signed)
Vit D level stable in 30s Will disc further at annual exam Disc safety issues She continues to smoke
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
## 2011-06-01 NOTE — Assessment & Plan Note (Signed)
On 02 24 hours with severe copd Pend CT chest from Dr Mayo Ao Friday Disc in detail risks of smoking and possible outcomes including copd, vascular/ heart disease, cancer , respiratory and sinus infections  Pt voices understanding Pt cutting down but not ready to quit

## 2011-06-01 NOTE — Assessment & Plan Note (Signed)
bp in fair control at this time  No changes needed  Disc lifstyle change with low sodium diet and exercise   Rev lab today F/u 6 months

## 2011-06-01 NOTE — Assessment & Plan Note (Signed)
Very good control lipitor and diet Disc goals for lipids and reasons to control them Rev labs with pt Rev low sat fat diet in detail  

## 2011-06-01 NOTE — Patient Instructions (Signed)
I sent px to your pharmacy Continue eating a healthy diet  Everything looks stable  Keep working on quitting smoking  Follow up in 6 months for annual exam with labs prior

## 2011-06-30 ENCOUNTER — Other Ambulatory Visit: Payer: Self-pay | Admitting: *Deleted

## 2011-06-30 MED ORDER — POTASSIUM CHLORIDE ER 10 MEQ PO TBCR: 10.0000 meq | EXTENDED_RELEASE_TABLET | Freq: Three times a day (TID) | ORAL | Status: DC

## 2011-06-30 NOTE — Telephone Encounter (Signed)
Received faxed refill request from pharmacy. Refill sent to pharmacy electronically. 

## 2011-08-25 ENCOUNTER — Other Ambulatory Visit: Payer: Self-pay | Admitting: Family Medicine

## 2011-08-25 ENCOUNTER — Other Ambulatory Visit: Payer: Self-pay | Admitting: *Deleted

## 2011-08-25 MED ORDER — POTASSIUM CHLORIDE ER 10 MEQ PO TBCR: 10.0000 meq | EXTENDED_RELEASE_TABLET | Freq: Three times a day (TID) | ORAL | Status: DC

## 2011-09-07 ENCOUNTER — Ambulatory Visit: Payer: Self-pay | Admitting: Urology

## 2011-09-10 ENCOUNTER — Ambulatory Visit: Payer: Self-pay | Admitting: Specialist

## 2011-09-10 LAB — CREATININE, SERUM: EGFR (African American): >60

## 2011-09-17 ENCOUNTER — Ambulatory Visit: Payer: Self-pay | Admitting: Cardiothoracic Surgery

## 2011-09-20 ENCOUNTER — Ambulatory Visit: Payer: Self-pay | Admitting: Cardiothoracic Surgery

## 2011-10-08 ENCOUNTER — Ambulatory Visit (INDEPENDENT_AMBULATORY_CARE_PROVIDER_SITE_OTHER): Payer: Medicare Other

## 2011-10-08 DIAGNOSIS — Z23 Encounter for immunization: Secondary | ICD-10-CM

## 2011-10-18 ENCOUNTER — Other Ambulatory Visit: Payer: Self-pay | Admitting: Family Medicine

## 2011-11-16 ENCOUNTER — Other Ambulatory Visit: Payer: Self-pay | Admitting: *Deleted

## 2011-11-16 MED ORDER — ATORVASTATIN CALCIUM 10 MG PO TABS: 10.0000 mg | ORAL_TABLET | Freq: Every day | ORAL | Status: DC

## 2011-11-16 MED ORDER — BENAZEPRIL HCL 20 MG PO TABS: 20.0000 mg | ORAL_TABLET | Freq: Every day | ORAL | Status: DC

## 2011-11-16 NOTE — Telephone Encounter (Signed)
Yes- she can have 12 mo of refils for that

## 2011-11-16 NOTE — Telephone Encounter (Signed)
I just wanted to double check before refilling, Is pt suppose to be on both benzapril  20 mg and lotrel 10/20 mg?

## 2011-11-23 ENCOUNTER — Telehealth: Payer: Self-pay | Admitting: Family Medicine

## 2011-11-23 DIAGNOSIS — I10 Essential (primary) hypertension: Secondary | ICD-10-CM

## 2011-11-23 DIAGNOSIS — E7800 Pure hypercholesterolemia, unspecified: Secondary | ICD-10-CM

## 2011-11-23 DIAGNOSIS — M899 Disorder of bone, unspecified: Secondary | ICD-10-CM

## 2011-11-23 DIAGNOSIS — E039 Hypothyroidism, unspecified: Secondary | ICD-10-CM

## 2011-11-23 DIAGNOSIS — E876 Hypokalemia: Secondary | ICD-10-CM

## 2011-11-23 NOTE — Telephone Encounter (Signed)
Message copied by Judy Pimple on Mon Nov 23, 2011  7:43 PM ------      Message from: Baldomero Lamy      Created: Thu Nov 19, 2011  7:08 AM      Regarding: Cpx labs Tues 11/5       Please order  future cpx labs for pt's upcomming lab appt.      Thanks      Rodney Booze

## 2011-11-24 ENCOUNTER — Other Ambulatory Visit (INDEPENDENT_AMBULATORY_CARE_PROVIDER_SITE_OTHER): Payer: Medicare Other

## 2011-11-24 DIAGNOSIS — I10 Essential (primary) hypertension: Secondary | ICD-10-CM

## 2011-11-24 DIAGNOSIS — E876 Hypokalemia: Secondary | ICD-10-CM

## 2011-11-24 DIAGNOSIS — E039 Hypothyroidism, unspecified: Secondary | ICD-10-CM

## 2011-11-24 DIAGNOSIS — M899 Disorder of bone, unspecified: Secondary | ICD-10-CM

## 2011-11-24 DIAGNOSIS — E7800 Pure hypercholesterolemia, unspecified: Secondary | ICD-10-CM

## 2011-11-24 LAB — COMPREHENSIVE METABOLIC PANEL
ALT: 18 U/L (ref 0–35)
CO2: 31 mEq/L (ref 19–32)
GFR: 88.55 mL/min (ref >60.00)
Potassium: 4 mEq/L (ref 3.5–5.1)
Sodium: 137 mEq/L (ref 135–145)
Total Bilirubin: 0.4 mg/dL (ref 0.3–1.2)
Total Protein: 6.4 g/dL (ref 6.0–8.3)

## 2011-11-24 LAB — CBC WITH DIFFERENTIAL/PLATELET
Basophils Absolute: 0 10*3/uL (ref 0.0–0.1)
Lymphocytes Relative: 26.4 % (ref 12.0–46.0)
Lymphs Abs: 1.3 10*3/uL (ref 0.7–4.0)
Monocytes Relative: 8.3 % (ref 3.0–12.0)
Neutrophils Relative %: 61.1 % (ref 43.0–77.0)
Platelets: 200 10*3/uL (ref 150.0–400.0)
RDW: 14 % (ref 11.5–14.6)

## 2011-11-24 LAB — LIPID PANEL
LDL Cholesterol: 80 mg/dL (ref 0–99)
VLDL: 15 mg/dL (ref 0.0–40.0)

## 2011-11-25 LAB — VITAMIN D 25 HYDROXY (VIT D DEFICIENCY, FRACTURES): Vit D, 25-Hydroxy: 34 ng/mL (ref 30–89)

## 2011-12-01 ENCOUNTER — Encounter: Payer: Self-pay | Admitting: Family Medicine

## 2011-12-01 ENCOUNTER — Ambulatory Visit (INDEPENDENT_AMBULATORY_CARE_PROVIDER_SITE_OTHER): Payer: Medicare Other | Admitting: Family Medicine

## 2011-12-01 VITALS — BP 134/76 | HR 64 | Temp 97.8°F | Ht 59.5 in | Wt 143.8 lb

## 2011-12-01 DIAGNOSIS — I10 Essential (primary) hypertension: Secondary | ICD-10-CM

## 2011-12-01 DIAGNOSIS — E039 Hypothyroidism, unspecified: Secondary | ICD-10-CM

## 2011-12-01 DIAGNOSIS — Z23 Encounter for immunization: Secondary | ICD-10-CM

## 2011-12-01 DIAGNOSIS — Z1211 Encounter for screening for malignant neoplasm of colon: Secondary | ICD-10-CM

## 2011-12-01 DIAGNOSIS — Z87891 Personal history of nicotine dependence: Secondary | ICD-10-CM

## 2011-12-01 DIAGNOSIS — E7800 Pure hypercholesterolemia, unspecified: Secondary | ICD-10-CM

## 2011-12-01 DIAGNOSIS — R350 Frequency of micturition: Secondary | ICD-10-CM

## 2011-12-01 DIAGNOSIS — M899 Disorder of bone, unspecified: Secondary | ICD-10-CM

## 2011-12-01 DIAGNOSIS — M949 Disorder of cartilage, unspecified: Secondary | ICD-10-CM

## 2011-12-01 LAB — POCT URINALYSIS DIPSTICK
Nitrite, UA: NEGATIVE
Protein, UA: NEGATIVE
pH, UA: 7

## 2011-12-01 NOTE — Patient Instructions (Addendum)
You are a little anemic Please do a stool card for me  Try to get some iron in your diet - deep leafy greens  Shingles vaccine today  Follow up in 6 months
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
## 2011-12-03 ENCOUNTER — Encounter: Payer: Self-pay | Admitting: *Deleted

## 2011-12-03 ENCOUNTER — Other Ambulatory Visit (INDEPENDENT_AMBULATORY_CARE_PROVIDER_SITE_OTHER): Payer: Medicare Other

## 2011-12-03 DIAGNOSIS — Z1211 Encounter for screening for malignant neoplasm of colon: Secondary | ICD-10-CM

## 2011-12-03 DIAGNOSIS — R350 Frequency of micturition: Secondary | ICD-10-CM | POA: Insufficient documentation

## 2011-12-03 LAB — FECAL OCCULT BLOOD, IMMUNOCHEMICAL: Fecal Occult Bld: NEGATIVE

## 2011-12-03 LAB — POCT UA - MICROSCOPIC ONLY
Casts, Ur, LPF, POC: 0
Yeast, UA: 0

## 2011-12-03 NOTE — Assessment & Plan Note (Signed)
Hypothyroidism  Pt has no clinical changes No change in energy level/ hair or skin/ edema and no tremor Lab Results  Component Value Date   TSH 2.15 11/24/2011

## 2011-12-03 NOTE — Assessment & Plan Note (Signed)
ua micro neg  Likely due to inc fluid intake today Will watch this

## 2011-12-03 NOTE — Assessment & Plan Note (Signed)
Pt has very mild anemia - may be from chronic dz  Will do stool card but adamantly declines colonosc  Given ifob

## 2011-12-03 NOTE — Assessment & Plan Note (Signed)
Commended greatly on quitting smoking Pt and family are pleased  Air exch on exam is imporved

## 2011-12-03 NOTE — Assessment & Plan Note (Signed)
Disc goals for lipids and reasons to control them Rev labs with pt Rev low sat fat diet in detail  lipitor and diet working well

## 2011-12-03 NOTE — Assessment & Plan Note (Signed)
Rev last dexa Is taking vitamin D No fragility fx Pleased she quit smoking

## 2011-12-03 NOTE — Assessment & Plan Note (Signed)
bp in fair control at this time  No changes needed  Disc lifstyle change with low sodium diet and exercise   Reviewed wellness labs today

## 2011-12-09 ENCOUNTER — Other Ambulatory Visit: Payer: Self-pay | Admitting: Family Medicine

## 2011-12-12 ENCOUNTER — Other Ambulatory Visit: Payer: Self-pay | Admitting: Family Medicine

## 2012-01-06 ENCOUNTER — Other Ambulatory Visit: Payer: Self-pay | Admitting: Family Medicine

## 2012-02-10 ENCOUNTER — Ambulatory Visit: Payer: Self-pay | Admitting: Specialist

## 2012-03-19 ENCOUNTER — Ambulatory Visit: Payer: Self-pay | Admitting: Cardiothoracic Surgery

## 2012-05-11 ENCOUNTER — Ambulatory Visit (INDEPENDENT_AMBULATORY_CARE_PROVIDER_SITE_OTHER): Payer: Medicare Other | Admitting: Family Medicine

## 2012-05-11 ENCOUNTER — Ambulatory Visit (INDEPENDENT_AMBULATORY_CARE_PROVIDER_SITE_OTHER)
Admission: RE | Admit: 2012-05-11 | Discharge: 2012-05-11 | Disposition: A | Discharge status: Home / Self Care | Payer: Medicare Other | Source: Ambulatory Visit | Attending: Family Medicine | Admitting: Family Medicine

## 2012-05-11 ENCOUNTER — Encounter: Payer: Self-pay | Admitting: Family Medicine

## 2012-05-11 ENCOUNTER — Ambulatory Visit
Admission: RE | Admit: 2012-05-11 | Discharge: 2012-05-11 | Disposition: A | Discharge status: Home / Self Care | Payer: Medicare Other | Source: Ambulatory Visit | Attending: Family Medicine | Admitting: Family Medicine

## 2012-05-11 VITALS — BP 128/74 | HR 68 | Temp 98.7°F | Ht 59.5 in | Wt 164.2 lb

## 2012-05-11 DIAGNOSIS — M25512 Pain in left shoulder: Secondary | ICD-10-CM

## 2012-05-11 DIAGNOSIS — M25561 Pain in right knee: Secondary | ICD-10-CM | POA: Insufficient documentation

## 2012-05-11 DIAGNOSIS — M25519 Pain in unspecified shoulder: Secondary | ICD-10-CM

## 2012-05-11 DIAGNOSIS — M25569 Pain in unspecified knee: Secondary | ICD-10-CM

## 2012-05-11 DIAGNOSIS — M25511 Pain in right shoulder: Secondary | ICD-10-CM
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
## 2012-05-11 NOTE — Assessment & Plan Note (Signed)
Medial tenderness/ reduced rom and mild crepitus Suspect OA xray

## 2012-05-11 NOTE — Patient Instructions (Addendum)
xrays today of shoulder and knee  We will call you with result and plan after they are read

## 2012-05-11 NOTE — Assessment & Plan Note (Signed)
Some acromion tenderness and reduced rom  Xray  Hx of prev rot cuff surg

## 2012-05-12 ENCOUNTER — Ambulatory Visit (INDEPENDENT_AMBULATORY_CARE_PROVIDER_SITE_OTHER)
Admission: RE | Admit: 2012-05-12 | Discharge: 2012-05-12 | Disposition: A | Discharge status: Home / Self Care | Payer: Medicare Other | Source: Ambulatory Visit | Attending: Family Medicine | Admitting: Family Medicine

## 2012-05-12 ENCOUNTER — Telehealth: Payer: Self-pay | Admitting: Family Medicine

## 2012-05-12 DIAGNOSIS — M25519 Pain in unspecified shoulder: Secondary | ICD-10-CM

## 2012-05-12 DIAGNOSIS — M25511 Pain in right shoulder: Secondary | ICD-10-CM

## 2012-05-12 DIAGNOSIS — M25561 Pain in right knee: Secondary | ICD-10-CM

## 2012-05-12 DIAGNOSIS — M25512 Pain in left shoulder: Secondary | ICD-10-CM

## 2012-05-12 NOTE — Assessment & Plan Note (Signed)
Pt mentions this to xray tech after the visit - that shoulder was not fully examined but seemed to have fair rom

## 2012-05-12 NOTE — Telephone Encounter (Signed)
Message copied by Judy Pimple on Thu May 12, 2012  3:29 PM ------      Message from: Shon Millet      Created: Thu May 12, 2012  1:47 PM       Pt's husband notified of xray results and agrees with referral,  I advise him Shirlee Limerick will call to set up appt ------

## 2012-05-31 ENCOUNTER — Encounter: Payer: Self-pay | Admitting: Family Medicine

## 2012-05-31 ENCOUNTER — Ambulatory Visit (INDEPENDENT_AMBULATORY_CARE_PROVIDER_SITE_OTHER): Payer: Medicare Other | Admitting: Family Medicine

## 2012-05-31 VITALS — BP 128/84 | HR 71 | Temp 98.7°F | Ht 59.5 in | Wt 164.8 lb

## 2012-05-31 DIAGNOSIS — E039 Hypothyroidism, unspecified: Secondary | ICD-10-CM

## 2012-05-31 DIAGNOSIS — I10 Essential (primary) hypertension: Secondary | ICD-10-CM

## 2012-05-31 LAB — CBC WITH DIFFERENTIAL/PLATELET
Basophils Absolute: 0 10*3/uL (ref 0.0–0.1)
Eosinophils Absolute: 0.2 10*3/uL (ref 0.0–0.7)
Lymphocytes Relative: 19.5 % (ref 12.0–46.0)
MCHC: 34.8 g/dL (ref 30.0–36.0)
Neutrophils Relative %: 67.1 % (ref 43.0–77.0)
RBC: 4.26 Mil/uL (ref 3.87–5.11)
RDW: 14 % (ref 11.5–14.6)

## 2012-05-31 LAB — COMPREHENSIVE METABOLIC PANEL
ALT: 17 U/L (ref 0–35)
AST: 21 U/L (ref 0–37)
Albumin: 4.1 g/dL (ref 3.5–5.2)
BUN: 12 mg/dL (ref 6–23)
CO2: 34 mEq/L — ABNORMAL HIGH (ref 19–32)
Calcium: 9.4 mg/dL (ref 8.4–10.5)
Chloride: 98 mEq/L (ref 96–112)
Potassium: 3.9 mEq/L (ref 3.5–5.1)
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
## 2012-05-31 NOTE — Assessment & Plan Note (Signed)
No complaints  Clinically stable and mood is good tsh today and update

## 2012-05-31 NOTE — Assessment & Plan Note (Signed)
bp in fair control at this time  No changes needed  Disc lifstyle change with low sodium diet and exercise  Lab today 

## 2012-05-31 NOTE — Patient Instructions (Addendum)
I'm glad you are doing well  Labs today  We will send a copy to Dr Imogene Burn  Follow up in 6 months for an annual exam (she will do labs the day of her exam)

## 2012-06-01 ENCOUNTER — Encounter: Payer: Self-pay | Admitting: Family Medicine

## 2012-06-15 ENCOUNTER — Other Ambulatory Visit: Payer: Self-pay | Admitting: Family Medicine

## 2012-06-16 ENCOUNTER — Other Ambulatory Visit: Payer: Self-pay | Admitting: Family Medicine

## 2012-06-17 ENCOUNTER — Other Ambulatory Visit: Payer: Self-pay | Admitting: *Deleted

## 2012-06-17 MED ORDER — LEVOTHYROXINE SODIUM 100 MCG PO TABS: 100.0000 ug | ORAL_TABLET | Freq: Every day | ORAL | Status: DC

## 2012-06-17 MED ORDER — AMLODIPINE BESY-BENAZEPRIL HCL 10-20 MG PO CAPS: 1.0000 | ORAL_CAPSULE | Freq: Every day | ORAL | Status: DC

## 2012-07-25 ENCOUNTER — Telehealth: Payer: Self-pay | Admitting: *Deleted

## 2012-07-25 NOTE — Telephone Encounter (Signed)
Left voicemail with pharmacy verifying that pt is on all 4 meds

## 2012-07-25 NOTE — Telephone Encounter (Signed)
Pharmacy sent a fax to verify whether pt was to be taking 4 bp meds (benzapril 20 mg qd, benicar 40 mg qd, amlodipine- benzapril 10/20 mg qd, and furosemide 40 mg qd) or not.  [facility] [place]

## 2012-07-25 NOTE — Telephone Encounter (Signed)
Yes- she is on all of those (her total dose of benazapril from the 2 sources together is 40 mg)

## 2012-08-09 ENCOUNTER — Ambulatory Visit: Payer: Self-pay | Admitting: Specialist

## 2012-10-18 ENCOUNTER — Ambulatory Visit (INDEPENDENT_AMBULATORY_CARE_PROVIDER_SITE_OTHER): Payer: Medicare Other

## 2012-10-18 DIAGNOSIS — Z23 Encounter for immunization: Secondary | ICD-10-CM

## 2012-11-15 ENCOUNTER — Other Ambulatory Visit: Payer: Self-pay | Admitting: Family Medicine

## 2012-11-15 MED ORDER — BENAZEPRIL HCL 20 MG PO TABS: 20.0000 mg | ORAL_TABLET | Freq: Every day | ORAL | Status: DC

## 2012-12-05 ENCOUNTER — Ambulatory Visit (INDEPENDENT_AMBULATORY_CARE_PROVIDER_SITE_OTHER): Payer: Medicare Other | Admitting: Family Medicine

## 2012-12-05 ENCOUNTER — Encounter: Payer: Self-pay | Admitting: Family Medicine

## 2012-12-05 VITALS — BP 126/80 | HR 90 | Temp 97.3°F | Ht 60.25 in | Wt 157.2 lb

## 2012-12-05 DIAGNOSIS — Z0000 Encounter for general adult medical examination without abnormal findings: Secondary | ICD-10-CM

## 2012-12-05 DIAGNOSIS — M899 Disorder of bone, unspecified: Secondary | ICD-10-CM

## 2012-12-05 DIAGNOSIS — E7800 Pure hypercholesterolemia, unspecified: Secondary | ICD-10-CM

## 2012-12-05 DIAGNOSIS — F17200 Nicotine dependence, unspecified, uncomplicated: Secondary | ICD-10-CM

## 2012-12-05 DIAGNOSIS — Z1211 Encounter for screening for malignant neoplasm of colon: Secondary | ICD-10-CM

## 2012-12-05 DIAGNOSIS — E039 Hypothyroidism, unspecified: Secondary | ICD-10-CM

## 2012-12-05 DIAGNOSIS — I10 Essential (primary) hypertension: Secondary | ICD-10-CM

## 2012-12-05 MED ORDER — AMLODIPINE BESY-BENAZEPRIL HCL 10-20 MG PO CAPS: 1.0000 | ORAL_CAPSULE | Freq: Every day | ORAL | Status: DC

## 2012-12-05 MED ORDER — FUROSEMIDE 40 MG PO TABS: 40.0000 mg | ORAL_TABLET | Freq: Every day | ORAL | Status: DC

## 2012-12-05 MED ORDER — LEVOTHYROXINE SODIUM 100 MCG PO TABS: 100.0000 ug | ORAL_TABLET | Freq: Every day | ORAL | Status: DC

## 2012-12-05 MED ORDER — OLMESARTAN MEDOXOMIL 40 MG PO TABS: 40.0000 mg | ORAL_TABLET | Freq: Every day | ORAL | Status: DC

## 2012-12-05 MED ORDER — POTASSIUM CHLORIDE ER 10 MEQ PO TBCR: 10.0000 meq | EXTENDED_RELEASE_TABLET | Freq: Three times a day (TID) | ORAL | Status: DC

## 2012-12-05 NOTE — Assessment & Plan Note (Signed)
BP: 126/80 mmHg   bp in fair control at this time  No changes needed  Disc lifstyle change with low sodium diet and exercise
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
## 2012-12-05 NOTE — Progress Notes (Signed)
Pre-visit discussion using our clinic review tool. No additional management support is needed unless otherwise documented below in the visit note.  

## 2012-12-05 NOTE — Patient Instructions (Addendum)
Labs today- I will forward to Dr Imogene Burn  Keep trying to quit smoking  Please do stool card for colon cancer screening  Follow up with Dr May or Dr Gertie Baron  about your dizziness and balance issues - also use walker when you are feeling unsteady  Talk to Dr Haskel Khan about getting a bone density test  Stay active  Make sure to schedule your mammogram  Follow up with me in about 6 months

## 2012-12-05 NOTE — Assessment & Plan Note (Signed)
inst pt to get dexa scheduled with her gyn in Jan (Dr Haskel Khan) No fractures Has fallen-disc fall risk in detail - enc her to f/u with her ENT doctor about dizziness and use a walker when needed Disc need for calcium/ vitamin D/ wt bearing exercise and bone density test every 2 y to monitor Disc safety/ fracture risk in detail

## 2012-12-05 NOTE — Assessment & Plan Note (Signed)
Reviewed health habits including diet and exercise and skin cancer prevention Also reviewed health mt list, fam hx and immunizations  See HPI Labs today

## 2012-12-05 NOTE — Assessment & Plan Note (Signed)
Disc in detail risks of smoking and possible outcomes including copd, vascular/ heart disease, cancer , respiratory and sinus infections  Pt voices understanding She is down to 2 cig per day and unsure if she can quit - despite her copd

## 2012-12-05 NOTE — Assessment & Plan Note (Signed)
D/w patient re:options for colon cancer screening, including IFOB vs. colonoscopy.  Risks and benefits of both were discussed and patient voiced understanding.  Pt elects for:IFOB card  

## 2012-12-05 NOTE — Assessment & Plan Note (Signed)
Lipid level today Has been controlled with low dose lipitor and diet  Disc goals for control as well as low sat fat diet

## 2012-12-05 NOTE — Assessment & Plan Note (Signed)
tsh today No clinical changes  Wt loss was intentional No change in exam

## 2012-12-06 ENCOUNTER — Other Ambulatory Visit: Payer: Self-pay | Admitting: Family Medicine

## 2012-12-06 LAB — CBC WITH DIFFERENTIAL/PLATELET
Basophils Absolute: 0.1 10*3/uL (ref 0.0–0.1)
Eosinophils Absolute: 0.1 10*3/uL (ref 0.0–0.7)
Lymphocytes Relative: 21.7 % (ref 12.0–46.0)
MCHC: 33.8 g/dL (ref 30.0–36.0)
Neutrophils Relative %: 70.3 % (ref 43.0–77.0)
RDW: 13.8 % (ref 11.5–14.6)

## 2012-12-06 LAB — LIPID PANEL
Cholesterol: 169 mg/dL (ref 0–200)
HDL: 66.5 mg/dL (ref >39.00)
LDL Cholesterol: 80 mg/dL (ref 0–99)
Total CHOL/HDL Ratio: 3
VLDL: 23 mg/dL (ref 0.0–40.0)

## 2012-12-06 LAB — COMPREHENSIVE METABOLIC PANEL
ALT: 12 U/L (ref 0–35)
AST: 16 U/L (ref 0–37)
Albumin: 4.3 g/dL (ref 3.5–5.2)
Alkaline Phosphatase: 97 U/L (ref 39–117)
BUN: 13 mg/dL (ref 6–23)
Chloride: 100 mEq/L (ref 96–112)
Potassium: 3.6 mEq/L (ref 3.5–5.1)
Sodium: 139 mEq/L (ref 135–145)
Total Bilirubin: 0.5 mg/dL (ref 0.3–1.2)
Total Protein: 7.3 g/dL (ref 6.0–8.3)

## 2012-12-06 LAB — VITAMIN D 25 HYDROXY (VIT D DEFICIENCY, FRACTURES): Vit D, 25-Hydroxy: 29 ng/mL — ABNORMAL LOW (ref 30–89)

## 2012-12-06 LAB — TSH: TSH: 2.01 u[IU]/mL (ref 0.35–5.50)

## 2012-12-08 ENCOUNTER — Other Ambulatory Visit: Payer: Self-pay | Admitting: Family Medicine

## 2012-12-09 ENCOUNTER — Other Ambulatory Visit: Payer: Self-pay | Admitting: *Deleted

## 2012-12-09 MED ORDER — ATORVASTATIN CALCIUM 10 MG PO TABS: ORAL_TABLET | ORAL | Status: DC

## 2012-12-11 ENCOUNTER — Other Ambulatory Visit: Payer: Self-pay | Admitting: Family Medicine

## 2013-02-02 ENCOUNTER — Other Ambulatory Visit: Payer: Self-pay | Admitting: *Deleted

## 2013-02-02 MED ORDER — BENAZEPRIL HCL 20 MG PO TABS: 20.0000 mg | ORAL_TABLET | Freq: Every day | ORAL | Status: DC

## 2013-06-09 ENCOUNTER — Ambulatory Visit (INDEPENDENT_AMBULATORY_CARE_PROVIDER_SITE_OTHER): Payer: Medicare Other | Admitting: Family Medicine

## 2013-06-09 ENCOUNTER — Ambulatory Visit: Payer: Self-pay | Admitting: Specialist

## 2013-06-09 ENCOUNTER — Encounter: Payer: Self-pay | Admitting: Family Medicine

## 2013-06-09 VITALS — BP 108/72 | HR 91 | Temp 98.1°F | Ht 60.25 in | Wt 160.0 lb

## 2013-06-09 DIAGNOSIS — I10 Essential (primary) hypertension: Secondary | ICD-10-CM

## 2013-06-09 DIAGNOSIS — F17200 Nicotine dependence, unspecified, uncomplicated: Secondary | ICD-10-CM

## 2013-06-09 DIAGNOSIS — E039 Hypothyroidism, unspecified: Secondary | ICD-10-CM

## 2013-06-09 MED ORDER — ATORVASTATIN CALCIUM 10 MG PO TABS: ORAL_TABLET | ORAL | Status: DC

## 2013-06-09 MED ORDER — BENAZEPRIL HCL 20 MG PO TABS: 20.0000 mg | ORAL_TABLET | Freq: Every day | ORAL | Status: DC

## 2013-06-09 NOTE — Assessment & Plan Note (Signed)
Disc in detail risks of smoking and possible outcomes including copd, vascular/ heart disease, cancer , respiratory and sinus infections  Pt voices understanding Pt is down to several cigarettes here and there -occ goes a week without smoking -but not ready to totally quit  She has f/u with her pulmonologist in June CT she had this am as well

## 2013-06-09 NOTE — Progress Notes (Signed)
Pre visit review using our clinic review tool, if applicable. No additional management support is needed unless otherwise documented below in the visit note. 
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
## 2013-06-09 NOTE — Assessment & Plan Note (Signed)
Hypothyroidism  Pt has no clinical changes No change in energy level/ hair or skin/ edema and no tremor Lab Results  Component Value Date   TSH 2.01 12/05/2012     Since she has no clincal changes we will re check tsh next time

## 2013-06-09 NOTE — Assessment & Plan Note (Signed)
bp in fair control at this time  BP Readings from Last 1 Encounters:  06/09/13 108/72   No changes needed Disc lifstyle change with low sodium diet and exercise   Pt's bp seems to be decreasing with time - hope to decrease med in the future    Chemistry      Component Value Date/Time   NA 139 12/05/2012 1515   K 3.6 12/05/2012 1515   CL 100 12/05/2012 1515   CO2 33* 12/05/2012 1515   BUN 13 12/05/2012 1515   CREATININE 0.8 12/05/2012 1515   CREATININE 0.85 05/23/2010 1713      Component Value Date/Time   CALCIUM 9.4 12/05/2012 1515   ALKPHOS 97 12/05/2012 1515   AST 16 12/05/2012 1515   ALT 12 12/05/2012 1515   BILITOT 0.5 12/05/2012 1515

## 2013-06-09 NOTE — Patient Instructions (Signed)
I'm glad you are doing well  Keep working on cutting back smoking  Also stay active and eat a healthy diet  Follow up in 6 months with labs prior for annual exam   When you are 64 you may qualify for another type of pneumonia vaccine called a prevnar  You can discuss this with Dr Vella Kohler

## 2013-06-13 ENCOUNTER — Telehealth: Payer: Self-pay | Admitting: Family Medicine

## 2013-06-13 NOTE — Telephone Encounter (Signed)
Relevant patient education mailed to patient.  

## 2013-06-13 NOTE — Telephone Encounter (Signed)
Relevant patient education assigned to patient using Emmi. ° °

## 2013-07-20 ENCOUNTER — Ambulatory Visit (INDEPENDENT_AMBULATORY_CARE_PROVIDER_SITE_OTHER): Payer: Medicare Other

## 2013-07-20 DIAGNOSIS — Z23 Encounter for immunization: Secondary | ICD-10-CM

## 2013-08-14 LAB — URINALYSIS, COMPLETE
Bilirubin,UR: NEGATIVE
Glucose,UR: NEGATIVE mg/dL (ref 0–75)
Ketone: NEGATIVE
NITRITE: NEGATIVE
Ph: 9 (ref 4.5–8.0)
RBC,UR: 714 /HPF (ref 0–5)
Specific Gravity: 1.012 (ref 1.003–1.030)

## 2013-08-14 LAB — TROPONIN I: Troponin-I: 0.15 ng/mL — ABNORMAL HIGH

## 2013-08-14 LAB — CBC WITH DIFFERENTIAL/PLATELET
Basophil #: 0 10*3/uL (ref 0.0–0.1)
Basophil %: 0.1 %
Eosinophil #: 0 10*3/uL (ref 0.0–0.7)
Eosinophil %: 0.1 %
HCT: 41.3 % (ref 35.0–47.0)
HGB: 13.6 g/dL (ref 12.0–16.0)
Lymphocyte #: 0.7 10*3/uL — ABNORMAL LOW (ref 1.0–3.6)
Lymphocyte %: 3.5 %
MCH: 31.4 pg (ref 26.0–34.0)
MCHC: 33 g/dL (ref 32.0–36.0)
MCV: 95 fL (ref 80–100)
MONOS PCT: 4 %
Monocyte #: 0.8 x10 3/mm (ref 0.2–0.9)
NEUTROS ABS: 17.7 10*3/uL — AB (ref 1.4–6.5)
NEUTROS PCT: 92.3 %
Platelet: 250 10*3/uL (ref 150–440)
RBC: 4.35 10*6/uL (ref 3.80–5.20)
RDW: 13.7 % (ref 11.5–14.5)
WBC: 19.2 10*3/uL — AB (ref 3.6–11.0)

## 2013-08-14 LAB — COMPREHENSIVE METABOLIC PANEL
ALBUMIN: 3.1 g/dL — AB (ref 3.4–5.0)
Alkaline Phosphatase: 83 U/L
Anion Gap: 11 (ref 7–16)
BUN: 47 mg/dL — ABNORMAL HIGH (ref 7–18)
Bilirubin,Total: 0.6 mg/dL (ref 0.2–1.0)
CHLORIDE: 94 mmol/L — AB (ref 98–107)
CREATININE: 4.35 mg/dL — AB (ref 0.60–1.30)
Calcium, Total: 9.3 mg/dL (ref 8.5–10.1)
Co2: 22 mmol/L (ref 21–32)
EGFR (African American): 12 — ABNORMAL LOW
EGFR (Non-African Amer.): 10 — ABNORMAL LOW
Glucose: 237 mg/dL — ABNORMAL HIGH (ref 65–99)
OSMOLALITY: 275 (ref 275–301)
Potassium: 5 mmol/L (ref 3.5–5.1)
SGOT(AST): 98 U/L — ABNORMAL HIGH (ref 15–37)
SGPT (ALT): 35 U/L
SODIUM: 127 mmol/L — AB (ref 136–145)
Total Protein: 7.5 g/dL (ref 6.4–8.2)

## 2013-08-14 LAB — CK TOTAL AND CKMB (NOT AT ARMC)
CK, Total: 4842 U/L — ABNORMAL HIGH
CK-MB: 15.6 ng/mL — AB (ref 0.5–3.6)

## 2013-08-15 ENCOUNTER — Inpatient Hospital Stay: Payer: Self-pay | Admitting: Internal Medicine

## 2013-08-15 LAB — URINALYSIS, COMPLETE
Bilirubin,UR: NEGATIVE
Glucose,UR: NEGATIVE mg/dL (ref 0–75)
Ketone: NEGATIVE
Nitrite: NEGATIVE
Ph: 9 (ref 4.5–8.0)
Protein: 500
Specific Gravity: 1.016 (ref 1.003–1.030)

## 2013-08-15 LAB — CK-MB
CK-MB: 25 ng/mL — ABNORMAL HIGH (ref 0.5–3.6)
CK-MB: 24.9 ng/mL — ABNORMAL HIGH (ref 0.5–3.6)

## 2013-08-15 LAB — TROPONIN I
TROPONIN-I: 0.16 ng/mL — AB
Troponin-I: 0.14 ng/mL — ABNORMAL HIGH

## 2013-08-15 LAB — PROTEIN / CREATININE RATIO, URINE
Creatinine, Urine: 84.6 mg/dL (ref 30.0–125.0)
PROTEIN, RANDOM URINE: 374 mg/dL — AB (ref 0–12)
Protein/Creat. Ratio: 4421 mg/gCREAT — ABNORMAL HIGH (ref 0–200)

## 2013-08-16 LAB — BASIC METABOLIC PANEL
Anion Gap: 11 (ref 7–16)
BUN: 47 mg/dL — ABNORMAL HIGH (ref 7–18)
CO2: 21 mmol/L (ref 21–32)
Calcium, Total: 8.4 mg/dL — ABNORMAL LOW (ref 8.5–10.1)
Chloride: 102 mmol/L (ref 98–107)
Creatinine: 1.94 mg/dL — ABNORMAL HIGH (ref 0.60–1.30)
EGFR (Non-African Amer.): 27 — ABNORMAL LOW
GFR CALC AF AMER: 31 — AB
GLUCOSE: 107 mg/dL — AB (ref 65–99)
Osmolality: 281 (ref 275–301)
Potassium: 3.8 mmol/L (ref 3.5–5.1)
SODIUM: 134 mmol/L — AB (ref 136–145)

## 2013-08-16 LAB — CBC WITH DIFFERENTIAL/PLATELET
Basophil #: 0 10*3/uL (ref 0.0–0.1)
Basophil %: 0.2 %
EOS ABS: 0.1 10*3/uL (ref 0.0–0.7)
EOS PCT: 1.1 %
HCT: 34 % — ABNORMAL LOW (ref 35.0–47.0)
HGB: 11.8 g/dL — AB (ref 12.0–16.0)
LYMPHS PCT: 2.2 %
Lymphocyte #: 0.2 10*3/uL — ABNORMAL LOW (ref 1.0–3.6)
MCH: 32.3 pg (ref 26.0–34.0)
MCHC: 34.6 g/dL (ref 32.0–36.0)
MCV: 94 fL (ref 80–100)
MONOS PCT: 5.6 %
Monocyte #: 0.5 x10 3/mm (ref 0.2–0.9)
Neutrophil #: 8.7 10*3/uL — ABNORMAL HIGH (ref 1.4–6.5)
Neutrophil %: 90.9 %
PLATELETS: 175 10*3/uL (ref 150–440)
RBC: 3.64 10*6/uL — ABNORMAL LOW (ref 3.80–5.20)
RDW: 13.9 % (ref 11.5–14.5)
WBC: 9.6 10*3/uL (ref 3.6–11.0)

## 2013-08-16 LAB — CK: CK, Total: 2261 U/L — ABNORMAL HIGH

## 2013-08-16 LAB — MAGNESIUM: MAGNESIUM: 1.8 mg/dL

## 2013-08-17 LAB — BASIC METABOLIC PANEL
ANION GAP: 10 (ref 7–16)
BUN: 24 mg/dL — ABNORMAL HIGH (ref 7–18)
CO2: 20 mmol/L — AB (ref 21–32)
Calcium, Total: 8.2 mg/dL — ABNORMAL LOW (ref 8.5–10.1)
Chloride: 108 mmol/L — ABNORMAL HIGH (ref 98–107)
Creatinine: 1.11 mg/dL (ref 0.60–1.30)
EGFR (Non-African Amer.): 52 — ABNORMAL LOW
Glucose: 57 mg/dL — ABNORMAL LOW (ref 65–99)
Osmolality: 277 (ref 275–301)
POTASSIUM: 3.5 mmol/L (ref 3.5–5.1)
Sodium: 138 mmol/L (ref 136–145)

## 2013-08-17 LAB — URINE CULTURE

## 2013-08-17 LAB — CK: CK, Total: 1430 U/L — ABNORMAL HIGH

## 2013-08-18 LAB — BASIC METABOLIC PANEL
ANION GAP: 9 (ref 7–16)
BUN: 16 mg/dL (ref 7–18)
Calcium, Total: 8.5 mg/dL (ref 8.5–10.1)
Chloride: 104 mmol/L (ref 98–107)
Co2: 22 mmol/L (ref 21–32)
Creatinine: 0.95 mg/dL (ref 0.60–1.30)
EGFR (African American): >60
EGFR (Non-African Amer.): >60
GLUCOSE: 92 mg/dL (ref 65–99)
OSMOLALITY: 271 (ref 275–301)
Potassium: 3.6 mmol/L (ref 3.5–5.1)
Sodium: 135 mmol/L — ABNORMAL LOW (ref 136–145)

## 2013-08-18 LAB — CULTURE, BLOOD (SINGLE)

## 2013-08-19 LAB — BASIC METABOLIC PANEL
Anion Gap: 8 (ref 7–16)
BUN: 17 mg/dL (ref 7–18)
CALCIUM: 8 mg/dL — AB (ref 8.5–10.1)
Chloride: 105 mmol/L (ref 98–107)
Co2: 22 mmol/L (ref 21–32)
Creatinine: 1.01 mg/dL (ref 0.60–1.30)
GFR CALC NON AF AMER: 59 — AB
GLUCOSE: 93 mg/dL (ref 65–99)
Osmolality: 271 (ref 275–301)
Potassium: 3.2 mmol/L — ABNORMAL LOW (ref 3.5–5.1)
SODIUM: 135 mmol/L — AB (ref 136–145)

## 2013-08-19 LAB — CLOSTRIDIUM DIFFICILE(ARMC)

## 2013-08-19 LAB — CK: CK, Total: 170 U/L

## 2013-08-20 LAB — BASIC METABOLIC PANEL
Anion Gap: 8 (ref 7–16)
BUN: 15 mg/dL (ref 7–18)
CALCIUM: 8 mg/dL — AB (ref 8.5–10.1)
CO2: 21 mmol/L (ref 21–32)
Chloride: 104 mmol/L (ref 98–107)
Creatinine: 1.21 mg/dL (ref 0.60–1.30)
EGFR (African American): 55 — ABNORMAL LOW
GFR CALC NON AF AMER: 47 — AB
Glucose: 122 mg/dL — ABNORMAL HIGH (ref 65–99)
Osmolality: 269 (ref 275–301)
Potassium: 3.3 mmol/L — ABNORMAL LOW (ref 3.5–5.1)
Sodium: 133 mmol/L — ABNORMAL LOW (ref 136–145)

## 2013-08-20 LAB — PLATELET COUNT: PLATELETS: 195 10*3/uL (ref 150–440)

## 2013-08-21 LAB — BASIC METABOLIC PANEL
ANION GAP: 8 (ref 7–16)
BUN: 12 mg/dL (ref 7–18)
CHLORIDE: 104 mmol/L (ref 98–107)
CO2: 26 mmol/L (ref 21–32)
Calcium, Total: 7.8 mg/dL — ABNORMAL LOW (ref 8.5–10.1)
Creatinine: 0.91 mg/dL (ref 0.60–1.30)
Glucose: 94 mg/dL (ref 65–99)
OSMOLALITY: 275 (ref 275–301)
Potassium: 3.7 mmol/L (ref 3.5–5.1)
SODIUM: 138 mmol/L (ref 136–145)

## 2013-08-28 ENCOUNTER — Ambulatory Visit: Payer: Medicare Other | Admitting: Family Medicine

## 2013-08-29 DIAGNOSIS — N390 Urinary tract infection, site not specified: Secondary | ICD-10-CM

## 2013-08-29 DIAGNOSIS — F319 Bipolar disorder, unspecified: Secondary | ICD-10-CM

## 2013-08-29 DIAGNOSIS — J189 Pneumonia, unspecified organism: Secondary | ICD-10-CM

## 2013-08-29 DIAGNOSIS — A498 Other bacterial infections of unspecified site: Secondary | ICD-10-CM

## 2013-08-30 ENCOUNTER — Ambulatory Visit (INDEPENDENT_AMBULATORY_CARE_PROVIDER_SITE_OTHER): Payer: Medicare Other | Admitting: Family Medicine

## 2013-08-30 ENCOUNTER — Encounter: Payer: Self-pay | Admitting: Family Medicine

## 2013-08-30 VITALS — BP 128/84 | HR 101 | Temp 97.7°F | Ht 60.25 in | Wt 154.8 lb

## 2013-08-30 DIAGNOSIS — Z87448 Personal history of other diseases of urinary system: Secondary | ICD-10-CM | POA: Insufficient documentation

## 2013-08-30 DIAGNOSIS — B9620 Unspecified Escherichia coli [E. coli] as the cause of diseases classified elsewhere: Secondary | ICD-10-CM | POA: Insufficient documentation

## 2013-08-30 DIAGNOSIS — N390 Urinary tract infection, site not specified: Secondary | ICD-10-CM

## 2013-08-30 DIAGNOSIS — F17200 Nicotine dependence, unspecified, uncomplicated: Secondary | ICD-10-CM

## 2013-08-30 DIAGNOSIS — I10 Essential (primary) hypertension: Secondary | ICD-10-CM

## 2013-08-30 DIAGNOSIS — A498 Other bacterial infections of unspecified site: Secondary | ICD-10-CM

## 2013-08-30 LAB — BASIC METABOLIC PANEL
BUN: 6 mg/dL (ref 6–23)
CHLORIDE: 98 meq/L (ref 96–112)
CO2: 32 meq/L (ref 19–32)
Calcium: 9 mg/dL (ref 8.4–10.5)
Creatinine, Ser: 0.8 mg/dL (ref 0.4–1.2)
GFR: 73.53 mL/min (ref >60.00)
Glucose, Bld: 96 mg/dL (ref 70–99)
Potassium: 3.8 mEq/L (ref 3.5–5.1)
SODIUM: 137 meq/L (ref 135–145)

## 2013-08-30 NOTE — Assessment & Plan Note (Signed)
Currently off benicar and lasix after renal failure in hosp Now imp  Bmp today Some pedal edema BP: 128/84 mmHg  - is ok today

## 2013-08-30 NOTE — Patient Instructions (Signed)
Lab today  Glad you are feeling better  If cough worsens or any fever let me know  Continue home care  Keep working on quitting smoking   Will update you re: medicines when labs return   See Dr Jacqlyn Larsen as scheduled

## 2013-08-30 NOTE — Progress Notes (Signed)
Pre visit review using our clinic review tool, if applicable. No additional management support is needed unless otherwise documented below in the visit note. 

## 2013-08-30 NOTE — Assessment & Plan Note (Signed)
From e coli uti in pt with solitary R kidney (L nephrectomy for renal cancer in the past) D/c cr was .91 Re check bmp today Will re start lasix if labs are ok  For indwelling cath removal at Dr Cope's office tues Hx of urinary retention - will be watched closely

## 2013-08-30 NOTE — Progress Notes (Signed)
Subjective:    Patient ID: Martha Patton, female    DOB: 1950/08/15, 63 y.o.   MRN: [id number]  HPI Here for f/u of hosp at [facility] 7/27 to 8/3 hosp for e coli uti and sepsis  Kidney started to fail due to the infection  Cr went up above 4 , at d/c .91   Blood cx and urine cx - both showed e coli  Is done with ceftin from 8/3-8/7   Atelectasis R lung  CT - brain - no changes   (she fell out of bed)  CT of kidney showed reactive hypertrophy of R kidney (sp L nephrectomy) c diff test neg  Electrolyte abn did normalize   She did have a catheter in the hospital - lack of bladder control Hx of urinary retention  Did have diarrhea from abx   Seeing the urologist on tues   Dr Candiss Norse - nephrologist saw her in the hospital   Per pt - she feels "real good" now  Is tired - but getting up and doing more  Gets cold at times- without fever  She has been coughing more in the past few days  Loose phlegm- is clear in color  Pulse ox 85% after ambulation on 02  This am at home at rest it was 95%   Home health care is coming also - very helpful  Patient Active Problem List   Diagnosis Date Noted  . E-coli UTI 08/30/2013  . History of renal failure 08/30/2013  . Encounter for Medicare annual wellness exam 12/05/2012  . Right knee pain 05/11/2012  . Left shoulder pain 05/11/2012  . Right shoulder pain 05/11/2012  . Colon cancer screening 12/01/2011  . HTN (hypertension) 03/30/2011  . Obstructive sleep apnea on CPAP   . Localized swelling, mass and lump, neck 09/01/2010  . OSTEOPENIA 04/29/2009  . LIVER FUNCTION TESTS, ABNORMAL, HX OF 02/13/2009  . RHINITIS 12/12/2008  . HYPOKALEMIA 05/01/2008  . Smoker 02/16/2007  . CARCINOMA, KIDNEY 04/28/2006  . HYPOTHYROIDISM 04/28/2006  . HASHIMOTO'S THYROIDITIS 04/28/2006  . HYPERCHOLESTEROLEMIA 04/28/2006  . BIPOLAR AFFECTIVE DISORDER 04/28/2006  . ABUSIVE PERSONALITY 04/28/2006  . DEAFNESS 04/28/2006  . COPD 04/28/2006  . OSTEOARTHRITIS  04/28/2006  . [place] DISEASE 04/28/2006  . SLEEP APNEA 04/28/2006   Past Medical History  Diagnosis Date  . COPD (chronic obstructive pulmonary disease)   . Hypertension   . Thyroid disease   . Osteoarthritis   . Bipolar 1 disorder   . Cancer of kidney   . Urinary retention   . Deafness     cochlear implants bilat  . Hyperlipidemia   . Allergy   . Macular degeneration     left eye  . Osteopenia   . Obstructive sleep apnea on CPAP    Past Surgical History  Procedure Laterality Date  . Appendectomy    . Cholecystectomy    . Abdominal hysterectomy    . Bladder suspension      bladder tack  . Nephrectomy      left  . Rotator cuff repair    . Tonsillectomy    . External ear surgery      x3  . Tibia fracture surgery      as a child  . Liver biopsy  02/1998    cysts  . Leep  2002  . Septoplasty  06/2003  . Cervical disc surgery     History  Substance Use Topics  . Smoking status: Current Every Day  Smoker -- 0.25 packs/day for 40 years    Types: Cigarettes  . Smokeless tobacco: Not on file     Comment: has quit in the past on and off   . Alcohol Use: Yes     Comment: rare   No family history on file. Allergies  Allergen Reactions  . Ezetimibe     REACTION: elevated lfts  . Fluvastatin Sodium     REACTION: elevated LFT's  . Naproxen     REACTION: GI   Current Outpatient Prescriptions on File Prior to Visit  Medication Sig Dispense Refill  . albuterol (PROVENTIL,VENTOLIN) 90 MCG/ACT inhaler Inhale 2 puffs into the lungs every 6 (six) hours as needed. For shortness of breath      . amLODipine-benazepril (LOTREL) 10-20 MG per capsule Take 1 capsule by mouth daily.  90 capsule  3  . ARIPiprazole (ABILIFY) 20 MG tablet Take 20 mg by mouth at bedtime.      Marland Kitchen atorvastatin (LIPITOR) 10 MG tablet TAKE 1 TABLET (10 MG TOTAL) BY MOUTH DAILY.  30 tablet  11  . benazepril (LOTENSIN) 20 MG tablet Take 1 tablet (20 mg total) by mouth daily.  30 tablet  11  .  buPROPion (WELLBUTRIN XL) 300 MG 24 hr tablet Take 300 mg by mouth daily.       . busPIRone (BUSPAR) 15 MG tablet Take 15 mg by mouth 2 (two) times daily.        . clonazePAM (KLONOPIN) 0.5 MG tablet Take 0.5 mg by mouth 2 (two) times daily as needed. For nerves      . fluticasone (FLONASE) 50 MCG/ACT nasal spray 1 spray by Nasal route 2 (two) times daily.        . Fluticasone-Salmeterol (ADVAIR DISKUS) 250-50 MCG/DOSE AEPB Inhale 2 puffs into the lungs 2 (two) times daily.       . furosemide (LASIX) 40 MG tablet Take 1 tablet (40 mg total) by mouth daily.  90 tablet  3  . ibuprofen (ADVIL,MOTRIN) 200 MG tablet Take 200-400 mg by mouth every 6 (six) hours as needed. For pain      . levothyroxine (SYNTHROID, LEVOTHROID) 100 MCG tablet Take 1 tablet (100 mcg total) by mouth daily.  90 tablet  3  . Melatonin 3 MG TABS Take 1 tablet by mouth daily.      . Multiple Vitamins-Minerals (CENTRUM SILVER PO) Take 1 tablet by mouth daily.       Marland Kitchen olmesartan (BENICAR) 40 MG tablet Take 1 tablet (40 mg total) by mouth daily.  90 tablet  3  . oxcarbazepine (TRILEPTAL) 600 MG tablet Take 600 mg by mouth 2 (two) times daily.       . potassium chloride (KLOR-CON 10) 10 MEQ tablet Take 1 tablet (10 mEq total) by mouth 3 (three) times daily.  270 tablet  3  . Tamsulosin HCl (FLOMAX) 0.4 MG CAPS Take 0.4 mg by mouth daily.       . traZODone (DESYREL) 100 MG tablet Take 150 mg by mouth at bedtime.       . [DISCONTINUED] Calcium-Vitamin D (CALTRATE 600 PLUS-VIT D PO) Take by mouth daily.         No current facility-administered medications on file prior to visit.     Review of Systems    Review of Systems  Constitutional: Negative for fever, appetite change, and unexpected weight change. pos for fatigue that is improving  Eyes: Negative for pain and visual disturbance.  Respiratory: Negative for  wheeze  and shortness of breath.  pos for occ cough-clear phlegm, pos for empty 02 tank today Cardiovascular: Negative  for cp or palpitations    Gastrointestinal: Negative for nausea, diarrhea and constipation.  Genitourinary: Negative for urgency and frequency. pos for indwelling cath with clear yellow urine  Skin: Negative for pallor or rash   Neurological: Negative for weakness, light-headedness, numbness and headaches.  Hematological: Negative for adenopathy. Does not bruise/bleed easily.  Psychiatric/Behavioral: Negative for dysphoric mood. The patient is not nervous/anxious.  (mood is stable) neg for confusion or delerium     Objective:   Physical Exam  Constitutional: She appears well-developed and well-nourished. No distress.  overwt well appearing female sitting in chair with 02 tank that is empty  HENT:  Head: Normocephalic and atraumatic.  Mouth/Throat: Oropharynx is clear and moist.  Hearing aides   Eyes: Conjunctivae and EOM are normal. Pupils are equal, round, and reactive to light. Right eye exhibits no discharge. Left eye exhibits no discharge. No scleral icterus.  Neck: Normal range of motion. Neck supple. No JVD present. Carotid bruit is not present. No thyromegaly present.  Cardiovascular: Regular rhythm, normal heart sounds and intact distal pulses.  Exam reveals no gallop.   HR slows to low 90s at rest  Pulmonary/Chest: Effort normal and breath sounds normal. No respiratory distress. She has no wheezes. She has no rales.  Not sob sitting  Some on exertion -of note she was not aware 02 tank is empty  No wheeze occ upper airway sound cleared by cough  Diffusely distant bs   Abdominal: Soft. Bowel sounds are normal. She exhibits no distension and no mass. There is no tenderness.  Musculoskeletal: She exhibits no edema and no tenderness.  No cva tenderness   Lymphadenopathy:    She has no cervical adenopathy.  Neurological: She is alert. She has normal reflexes. No cranial nerve deficit. She exhibits normal muscle tone. Coordination normal.  Skin: Skin is warm and dry. No rash  noted. No erythema. No pallor.  Psychiatric: She has a normal mood and affect.  Mentally clear Answers questions appropriately          Assessment & Plan:   Problem List Items Addressed This Visit     Cardiovascular and Mediastinum   HTN (hypertension)     Currently off benicar and lasix after renal failure in hosp Now imp  Bmp today Some pedal edema BP: 128/84 mmHg  - is ok today      Genitourinary   E-coli UTI - Primary     With e coli sepsis /uti recent hosp at [facility] notes and studies in detail - still pending official d/c summary Rev cxr (atelectasis), CT head (after a fall in hosp) and CT kidney Seen by renal and cr returned to baseline at d/c (renal f/u was not recommended) Much imp after tx and she has finished ceftin outpt  Back to baseline -however still has indwelling cath- to be re checked and hopefully removed by Dr Jacqlyn Larsen on Tuesday Disc imp of hydration  Has homecare- helping with strength /independence and she is improving Daughter with her today - very helpful   Bmp today to verify resolution of her electrolyte abn - to re start reg meds if nl   Of note- pulse ox 85% today because her portable 02 tank was empty-not sob however and will test it when she gets home (was 95% this am on 02)     Relevant Orders  Basic metabolic panel     Other   Smoker     Smoking a few cig per day  Has used nictotine patch since hosp  Urged to keep trying to quit     History of renal failure     From e coli uti in pt with solitary R kidney (L nephrectomy for renal cancer in the past) D/c cr was .91 Re check bmp today Will re start lasix if labs are ok  For indwelling cath removal at Dr Cope's office tues Hx of urinary retention - will be watched closely    Relevant Orders      Basic metabolic panel

## 2013-08-30 NOTE — Assessment & Plan Note (Signed)
Smoking a few cig per day  Has used nictotine patch since hosp  Urged to keep trying to quit

## 2013-08-30 NOTE — Assessment & Plan Note (Signed)
With e coli sepsis /uti recent hosp at [place] notes and studies in detail - still pending official d/c summary Rev cxr (atelectasis), CT head (after a fall in hosp) and CT kidney Seen by renal and cr returned to baseline at d/c (renal f/u was not recommended) Much imp after tx and she has finished ceftin outpt  Back to baseline -however still has indwelling cath- to be re checked and hopefully removed by Dr Jacqlyn Larsen on Tuesday Disc imp of hydration  Has homecare- helping with strength /independence and she is improving Daughter with her today - very helpful   Bmp today to verify resolution of her electrolyte abn - to re start reg meds if nl   Of note- pulse ox 85% today because her portable 02 tank was empty-not sob however and will test it when she gets home (was 95% this am on 02)

## 2013-09-21 ENCOUNTER — Other Ambulatory Visit: Payer: Self-pay | Admitting: *Deleted

## 2013-09-21 MED ORDER — ATORVASTATIN CALCIUM 10 MG PO TABS: ORAL_TABLET | ORAL | Status: AC

## 2013-09-21 MED ORDER — BENAZEPRIL HCL 20 MG PO TABS: 20.0000 mg | ORAL_TABLET | Freq: Every day | ORAL | Status: DC

## 2013-09-21 NOTE — Telephone Encounter (Signed)
Received fax saying pt would like 90 day supplies of Rx instead of 30, done

## 2013-10-17 ENCOUNTER — Telehealth: Payer: Self-pay | Admitting: Family Medicine

## 2013-10-17 NOTE — Telephone Encounter (Signed)
Form in your inbox 

## 2013-10-17 NOTE — Telephone Encounter (Signed)
Spouse notified form ready for pick up 

## 2013-10-17 NOTE — Telephone Encounter (Signed)
Pts husband dropped off handicap placard request to be filled out. Will put in inbox.

## 2013-10-17 NOTE — Telephone Encounter (Signed)
Done and in IN box 

## 2013-10-25 ENCOUNTER — Ambulatory Visit (INDEPENDENT_AMBULATORY_CARE_PROVIDER_SITE_OTHER): Payer: Medicare Other

## 2013-10-25 DIAGNOSIS — Z23 Encounter for immunization: Secondary | ICD-10-CM

## 2013-10-30 LAB — PULMONARY FUNCTION TEST

## 2013-12-06 ENCOUNTER — Telehealth: Payer: Self-pay | Admitting: Family Medicine

## 2013-12-06 DIAGNOSIS — M8580 Other specified disorders of bone density and structure, unspecified site: Secondary | ICD-10-CM

## 2013-12-06 DIAGNOSIS — E039 Hypothyroidism, unspecified: Secondary | ICD-10-CM

## 2013-12-06 DIAGNOSIS — I10 Essential (primary) hypertension: Secondary | ICD-10-CM

## 2013-12-06 DIAGNOSIS — E7800 Pure hypercholesterolemia, unspecified: Secondary | ICD-10-CM

## 2013-12-06 NOTE — Telephone Encounter (Signed)
-----   Message from Ellamae Sia sent at 11/30/2013  4:40 PM EST ----- Regarding: Lab orders for Thursday, 11.19.15 Patient is scheduled for CPX labs, please order future labs, Thanks , Karna Christmas

## 2013-12-07 ENCOUNTER — Other Ambulatory Visit (INDEPENDENT_AMBULATORY_CARE_PROVIDER_SITE_OTHER): Payer: Medicare Other

## 2013-12-07 DIAGNOSIS — M8580 Other specified disorders of bone density and structure, unspecified site: Secondary | ICD-10-CM

## 2013-12-07 DIAGNOSIS — E7800 Pure hypercholesterolemia, unspecified: Secondary | ICD-10-CM

## 2013-12-07 DIAGNOSIS — I10 Essential (primary) hypertension: Secondary | ICD-10-CM

## 2013-12-07 DIAGNOSIS — E039 Hypothyroidism, unspecified: Secondary | ICD-10-CM

## 2013-12-07 LAB — CBC WITH DIFFERENTIAL/PLATELET
Basophils Absolute: 0 10*3/uL (ref 0.0–0.1)
Basophils Relative: 0.5 % (ref 0.0–3.0)
EOS ABS: 0.2 10*3/uL (ref 0.0–0.7)
Eosinophils Relative: 3.5 % (ref 0.0–5.0)
HCT: 35.8 % — ABNORMAL LOW (ref 36.0–46.0)
Hemoglobin: 11.9 g/dL — ABNORMAL LOW (ref 12.0–15.0)
LYMPHS ABS: 1 10*3/uL (ref 0.7–4.0)
Lymphocytes Relative: 17.9 % (ref 12.0–46.0)
MCHC: 33.3 g/dL (ref 30.0–36.0)
MCV: 89.3 fl (ref 78.0–100.0)
Monocytes Absolute: 0.5 10*3/uL (ref 0.1–1.0)
Monocytes Relative: 9.2 % (ref 3.0–12.0)
NEUTROS ABS: 3.9 10*3/uL (ref 1.4–7.7)
Neutrophils Relative %: 68.9 % (ref 43.0–77.0)
PLATELETS: 323 10*3/uL (ref 150.0–400.0)
RBC: 4.01 Mil/uL (ref 3.87–5.11)
RDW: 14.3 % (ref 11.5–15.5)
WBC: 5.7 10*3/uL (ref 4.0–10.5)

## 2013-12-07 LAB — COMPREHENSIVE METABOLIC PANEL
ALBUMIN: 3.7 g/dL (ref 3.5–5.2)
ALT: 12 U/L (ref 0–35)
AST: 28 U/L (ref 0–37)
Alkaline Phosphatase: 79 U/L (ref 39–117)
BUN: 8 mg/dL (ref 6–23)
CO2: 32 meq/L (ref 19–32)
CREATININE: 0.8 mg/dL (ref 0.4–1.2)
Calcium: 9.2 mg/dL (ref 8.4–10.5)
Chloride: 97 mEq/L (ref 96–112)
GFR: 78.92 mL/min (ref >60.00)
Glucose, Bld: 95 mg/dL (ref 70–99)
Potassium: 3.3 mEq/L — ABNORMAL LOW (ref 3.5–5.1)
SODIUM: 136 meq/L (ref 135–145)
TOTAL PROTEIN: 7.2 g/dL (ref 6.0–8.3)
Total Bilirubin: 0.5 mg/dL (ref 0.2–1.2)

## 2013-12-07 LAB — LIPID PANEL
Cholesterol: 161 mg/dL (ref 0–200)
HDL: 64.6 mg/dL (ref >39.00)
LDL Cholesterol: 81 mg/dL (ref 0–99)
NONHDL: 96.4
Total CHOL/HDL Ratio: 2
Triglycerides: 75 mg/dL (ref 0.0–149.0)
VLDL: 15 mg/dL (ref 0.0–40.0)

## 2013-12-07 LAB — VITAMIN D 25 HYDROXY (VIT D DEFICIENCY, FRACTURES): VITD: 28.26 ng/mL — AB (ref 30.00–100.00)

## 2013-12-07 LAB — TSH: TSH: 4.27 u[IU]/mL (ref 0.35–4.50)

## 2013-12-11 ENCOUNTER — Encounter: Payer: Self-pay | Admitting: Family Medicine

## 2013-12-11 ENCOUNTER — Ambulatory Visit (INDEPENDENT_AMBULATORY_CARE_PROVIDER_SITE_OTHER): Payer: Medicare Other | Admitting: Family Medicine

## 2013-12-11 VITALS — BP 132/88 | HR 84 | Temp 98.3°F | Ht 59.25 in | Wt 143.5 lb

## 2013-12-11 DIAGNOSIS — E039 Hypothyroidism, unspecified: Secondary | ICD-10-CM

## 2013-12-11 DIAGNOSIS — I10 Essential (primary) hypertension: Secondary | ICD-10-CM

## 2013-12-11 DIAGNOSIS — Z720 Tobacco use: Secondary | ICD-10-CM

## 2013-12-11 DIAGNOSIS — Z1211 Encounter for screening for malignant neoplasm of colon: Secondary | ICD-10-CM

## 2013-12-11 DIAGNOSIS — F17200 Nicotine dependence, unspecified, uncomplicated: Secondary | ICD-10-CM

## 2013-12-11 DIAGNOSIS — M8580 Other specified disorders of bone density and structure, unspecified site: Secondary | ICD-10-CM

## 2013-12-11 DIAGNOSIS — Z0000 Encounter for general adult medical examination without abnormal findings: Secondary | ICD-10-CM

## 2013-12-11 DIAGNOSIS — K432 Incisional hernia without obstruction or gangrene: Secondary | ICD-10-CM

## 2013-12-11 MED ORDER — POTASSIUM CHLORIDE ER 10 MEQ PO TBCR: 10.0000 meq | EXTENDED_RELEASE_TABLET | Freq: Three times a day (TID) | ORAL | Status: DC

## 2013-12-11 MED ORDER — FUROSEMIDE 40 MG PO TABS: 40.0000 mg | ORAL_TABLET | Freq: Every day | ORAL | Status: DC

## 2013-12-11 NOTE — Patient Instructions (Signed)
See Dr Lucianne Lei dalen- get your mammogram  You need a bone density test every 2 years  Try to get 1200-1500 mg of calcium per day with at least 800 iu of vitamin D - for bone health  Add another 1000 iu of vitaman D -this is for bone health Stay off benicar Resume lasix and potassium

## 2013-12-11 NOTE — Progress Notes (Signed)
Subjective:    Patient ID: Martha Patton, female    DOB: 21-Jun-1950, 64 y.o.   MRN: [id number]  HPI Here for annual medicare wellness visit and also acute and chronic medical problems  I have personally reviewed the Medicare Annual Wellness questionnaire and have noted 1. The patient's medical and social history 2. Their use of alcohol, tobacco or illicit drugs 3. Their current medications and supplements 4. The patient's functional ability including ADL's, fall risks, home safety risks and hearing or visual             impairment. 5. Diet and physical activities 6. Evidence for depression or mood disorders  The patients weight, height, BMI have been recorded in the chart and visual acuity is per eye clinic.  I have made referrals, counseling and provided education to the patient based review of the above and I have provided the pt with a written personalized care plan for preventive services.  Doing ok overall  Thinks she may have a hernia - just above her groin - not painful / on R side  May get bigger when she strains  Avoids heavy lifting   COPD - stable - saw Dr Vella Kohler in Oct  Pulse ox is 93%  Smokes 1/2 ppd - went back to it   See scanned forms.  Routine anticipatory guidance given to patient.  See health maintenance. Colon cancer screening-declines colonosc (copd)- will do stool card  Breast cancer screening 11/14 with gyn (Dr Lucianne Lei dalen-has appt Dec 1st)  Self breast exam no lumps  Flu vaccine 10/15 Tetanus vaccine 7/06 Pneumovax utd on pneumovax 23 and then had prevnar 7/15  Zoster vaccine 11/13   Advance directive= has a living will and adv directive  Cognitive function addressed- see scanned forms- and if abnormal then additional documentation follows. Fairly decent memory (occ struggles with names)   PMH and [place] reviewed  Meds, vitals, and allergies reviewed.   ROS: See HPI.  Otherwise negative.    bp is stable today  No cp or palpitations or headaches or  edema  No side effects to medicines  BP Readings from Last 3 Encounters:  12/11/13 132/88  08/30/13 128/84  06/09/13 108/72    She is not taking benicar/ furosimide or [place] con - based on discharge from the hospital  Perhaps ? More swelling in ankles -will check     Chemistry      Component Value Date/Time   NA 136 12/07/2013 0855   K 3.3* 12/07/2013 0855   CL 97 12/07/2013 0855   CO2 32 12/07/2013 0855   BUN 8 12/07/2013 0855   CREATININE 0.8 12/07/2013 0855   CREATININE 0.85 05/23/2010 1713      Component Value Date/Time   CALCIUM 9.2 12/07/2013 0855   ALKPHOS 79 12/07/2013 0855   AST 28 12/07/2013 0855   ALT 12 12/07/2013 0855   BILITOT 0.5 12/07/2013 0855      Wt is down 9 lb  bmi is 28  She lost wt while sick and it goes up and down   Continues to see Dr Bridgett Larsson for her mood disorder  Worse with grey /rainy weather  Doing fair  She declines counseling at this time   Hypothyroidism  Pt has no clinical changes No change in energy level/ hair or skin/ edema and no tremor Lab Results  Component Value Date   TSH 4.27 12/07/2013     Vit D 28  Osteopenia  Had dexa 1 y ago  with Dr Lucianne Lei dalen-he is watching that   Patient Active Problem List   Diagnosis Date Noted  . E-coli UTI 08/30/2013  . History of renal failure 08/30/2013  . Encounter for Medicare annual wellness exam 12/05/2012  . Right knee pain 05/11/2012  . Left shoulder pain 05/11/2012  . Right shoulder pain 05/11/2012  . Colon cancer screening 12/01/2011  . HTN (hypertension) 03/30/2011  . Obstructive sleep apnea on CPAP   . Localized swelling, mass and lump, neck 09/01/2010  . Osteopenia 04/29/2009  . LIVER FUNCTION TESTS, ABNORMAL, HX OF 02/13/2009  . RHINITIS 12/12/2008  . HYPOKALEMIA 05/01/2008  . Smoker 02/16/2007  . CARCINOMA, KIDNEY 04/28/2006  . Hypothyroidism 04/28/2006  . HASHIMOTO'S THYROIDITIS 04/28/2006  . HYPERCHOLESTEROLEMIA 04/28/2006  . BIPOLAR AFFECTIVE DISORDER 04/28/2006  .  ABUSIVE PERSONALITY 04/28/2006  . DEAFNESS 04/28/2006  . COPD 04/28/2006  . OSTEOARTHRITIS 04/28/2006  . [place] DISEASE 04/28/2006  . SLEEP APNEA 04/28/2006   Past Medical History  Diagnosis Date  . COPD (chronic obstructive pulmonary disease)   . Hypertension   . Thyroid disease   . Osteoarthritis   . Bipolar 1 disorder   . Cancer of kidney   . Urinary retention   . Deafness     cochlear implants bilat  . Hyperlipidemia   . Allergy   . Macular degeneration     left eye  . Osteopenia   . Obstructive sleep apnea on CPAP    Past Surgical History  Procedure Laterality Date  . Appendectomy    . Cholecystectomy    . Abdominal hysterectomy    . Bladder suspension      bladder tack  . Nephrectomy      left  . Rotator cuff repair    . Tonsillectomy    . External ear surgery      x3  . Tibia fracture surgery      as a child  . Liver biopsy  02/1998    cysts  . Leep  2002  . Septoplasty  06/2003  . Cervical disc surgery     History  Substance Use Topics  . Smoking status: Current Every Day Smoker -- 0.50 packs/day for 40 years    Types: Cigarettes  . Smokeless tobacco: Not on file     Comment: has quit in the past on and off   . Alcohol Use: 0.0 oz/week    0 Not specified per week     Comment: rare   No family history on file. Allergies  Allergen Reactions  . Ezetimibe     REACTION: elevated lfts  . Fluvastatin Sodium     REACTION: elevated LFT's  . Naproxen     REACTION: GI   Current Outpatient Prescriptions on File Prior to Visit  Medication Sig Dispense Refill  . albuterol (PROVENTIL,VENTOLIN) 90 MCG/ACT inhaler Inhale 2 puffs into the lungs every 6 (six) hours as needed. For shortness of breath    . amLODipine-benazepril (LOTREL) 10-20 MG per capsule Take 1 capsule by mouth daily. 90 capsule 3  . ARIPiprazole (ABILIFY) 20 MG tablet Take 20 mg by mouth at bedtime.    Marland Kitchen atorvastatin (LIPITOR) 10 MG tablet TAKE 1 TABLET (10 MG TOTAL) BY MOUTH  DAILY. 90 tablet 1  . benazepril (LOTENSIN) 20 MG tablet Take 1 tablet (20 mg total) by mouth daily. 90 tablet 1  . buPROPion (WELLBUTRIN XL) 300 MG 24 hr tablet Take 300 mg by mouth daily.     . busPIRone (BUSPAR)  15 MG tablet Take 15 mg by mouth 2 (two) times daily.      . clonazePAM (KLONOPIN) 0.5 MG tablet Take 0.5 mg by mouth 2 (two) times daily as needed. For nerves    . fluticasone (FLONASE) 50 MCG/ACT nasal spray 1 spray by Nasal route 2 (two) times daily.      . Fluticasone-Salmeterol (ADVAIR DISKUS) 250-50 MCG/DOSE AEPB Inhale 2 puffs into the lungs 2 (two) times daily.     . furosemide (LASIX) 40 MG tablet Take 1 tablet (40 mg total) by mouth daily. (Patient not taking: Reported on 12/11/2013) 90 tablet 3  . ibuprofen (ADVIL,MOTRIN) 200 MG tablet Take 200-400 mg by mouth every 6 (six) hours as needed. For pain    . levothyroxine (SYNTHROID, LEVOTHROID) 100 MCG tablet Take 1 tablet (100 mcg total) by mouth daily. 90 tablet 3  . Melatonin 3 MG TABS Takes 3-10 mg per night    . Multiple Vitamins-Minerals (CENTRUM SILVER PO) Take 1 tablet by mouth daily.     Marland Kitchen olmesartan (BENICAR) 40 MG tablet Take 1 tablet (40 mg total) by mouth daily. (Patient not taking: Reported on 12/11/2013) 90 tablet 3  . oxcarbazepine (TRILEPTAL) 600 MG tablet Take 600 mg by mouth 2 (two) times daily.     . potassium chloride (KLOR-CON 10) 10 MEQ tablet Take 1 tablet (10 mEq total) by mouth 3 (three) times daily. (Patient not taking: Reported on 12/11/2013) 270 tablet 3  . Tamsulosin HCl (FLOMAX) 0.4 MG CAPS Take 0.4 mg by mouth daily.     . traZODone (DESYREL) 100 MG tablet Take 150 mg by mouth at bedtime.     . [DISCONTINUED] Calcium-Vitamin D (CALTRATE 600 PLUS-VIT D PO) Take by mouth daily.       No current facility-administered medications on file prior to visit.     Review of Systems Review of Systems  Constitutional: Negative for fever, appetite change, fatigue and unexpected weight change.  Eyes:  Negative for pain and visual disturbance.  Respiratory: Negative for cough and pos for baseline shortness of breath.   Cardiovascular: Negative for cp or palpitations    Gastrointestinal: Negative for nausea, diarrhea and constipation.  Genitourinary: Negative for urgency and frequency.  Skin: Negative for pallor or rash   Neurological: Negative for weakness, light-headedness, numbness and headaches.  Hematological: Negative for adenopathy. Does not bruise/bleed easily.  Psychiatric/Behavioral: Negative for dysphoric mood. The patient is not nervous/anxious.         Objective:   Physical Exam  Constitutional: She appears well-developed and well-nourished. No distress.  overwt and well app  HENT:  Head: Normocephalic and atraumatic.  Right Ear: External ear normal.  Left Ear: External ear normal.  Nose: Nose normal.  Mouth/Throat: Oropharynx is clear and moist.  Hard of hearing   Eyes: Conjunctivae and EOM are normal. Pupils are equal, round, and reactive to light. Right eye exhibits no discharge. Left eye exhibits no discharge. No scleral icterus.  Neck: Normal range of motion. Neck supple. No JVD present. No thyromegaly present.  Cardiovascular: Normal rate, regular rhythm, normal heart sounds and intact distal pulses.  Exam reveals no gallop.   Pulmonary/Chest: Effort normal and breath sounds normal. No respiratory distress. She has no wheezes. She has no rales.  Diffusely distant bs  No wheeze today Wearing 02 by [place]  Abdominal: Soft. Bowel sounds are normal. She exhibits no distension and no mass. There is no tenderness.  Musculoskeletal: She exhibits no edema or tenderness.  Lymphadenopathy:  She has no cervical adenopathy.  Neurological: She is alert. She has normal reflexes. No cranial nerve deficit. She exhibits normal muscle tone. Coordination normal.  Skin: Skin is warm and dry. No rash noted. No erythema. No pallor.  Psychiatric: She has a normal mood and affect.           Assessment & Plan:   Problem List Items Addressed This Visit      Cardiovascular and Mediastinum   HTN (hypertension)    bp in fair control at this time  BP Readings from Last 1 Encounters:  12/11/13 132/88   No changes needed Disc lifstyle change with low sodium diet and exercise  Off lasix and benicar bp is ok  Will resume lasix for edema however and continue to follow  Labs reviewed     Relevant Medications      furosemide (LASIX) tablet     Endocrine   Hypothyroidism    Hypothyroidism  Pt has no clinical changes No change in energy level/ hair or skin/ edema and no tremor Lab Results  Component Value Date   TSH 4.27 12/07/2013          Musculoskeletal and Integument   Osteopenia    Disc need for calcium/ vitamin D/ wt bearing exercise and bone density test every 2 y to monitor Disc safety/ fracture risk in detail  Pt will plan her next dexa with Dr Vernie Ammons Did enc her to quit smoking       Other   Colon cancer screening    IFOB card given     Relevant Orders      Fecal occult blood, imunochemical   Encounter for Medicare annual wellness exam - Primary    Reviewed health habits including diet and exercise and skin cancer prevention Reviewed appropriate screening tests for age  Also reviewed health mt list, fam hx and immunization status , as well as social and family history   See HPI Pt will see gyn upcoming  Labs reviewed      Incisional hernia, without obstruction or gangrene    Small and reducible in R side of abd along midline scar/near umbilicus Disc avoidance of straining Will watch for growth or  Symptoms  Improves with wt loss     Smoker    Pt quits on and off Copd-sees pulmonary Disc in detail risks of smoking and possible outcomes including copd, vascular/ heart disease, cancer , respiratory and sinus infections  Pt voices understanding

## 2013-12-11 NOTE — Progress Notes (Signed)
Pre visit review using our clinic review tool, if applicable. No additional management support is needed unless otherwise documented below in the visit note. 

## 2013-12-12 ENCOUNTER — Telehealth: Payer: Self-pay | Admitting: Family Medicine

## 2013-12-12 NOTE — Telephone Encounter (Signed)
emmi emailed °

## 2013-12-15 ENCOUNTER — Other Ambulatory Visit (INDEPENDENT_AMBULATORY_CARE_PROVIDER_SITE_OTHER): Payer: Medicare Other

## 2013-12-15 DIAGNOSIS — K432 Incisional hernia without obstruction or gangrene: Secondary | ICD-10-CM | POA: Insufficient documentation

## 2013-12-15 DIAGNOSIS — Z1211 Encounter for screening for malignant neoplasm of colon: Secondary | ICD-10-CM

## 2013-12-15 LAB — FECAL OCCULT BLOOD, IMMUNOCHEMICAL: FECAL OCCULT BLD: POSITIVE — AB

## 2013-12-15 NOTE — Assessment & Plan Note (Signed)
Hypothyroidism  Pt has no clinical changes No change in energy level/ hair or skin/ edema and no tremor Lab Results  Component Value Date   TSH 4.27 12/07/2013

## 2013-12-15 NOTE — Assessment & Plan Note (Signed)
Small and reducible in R side of abd along midline scar/near umbilicus Disc avoidance of straining Will watch for growth or  Symptoms  Improves with wt loss

## 2013-12-15 NOTE — Assessment & Plan Note (Signed)
Reviewed health habits including diet and exercise and skin cancer prevention Reviewed appropriate screening tests for age  Also reviewed health mt list, fam hx and immunization status , as well as social and family history   See HPI Pt will see gyn upcoming  Labs reviewed

## 2013-12-15 NOTE — Assessment & Plan Note (Signed)
Pt quits on and off Copd-sees pulmonary Disc in detail risks of smoking and possible outcomes including copd, vascular/ heart disease, cancer , respiratory and sinus infections  Pt voices understanding

## 2013-12-15 NOTE — Assessment & Plan Note (Signed)
Disc need for calcium/ vitamin D/ wt bearing exercise and bone density test every 2 y to monitor Disc safety/ fracture risk in detail  Pt will plan her next dexa with Dr Vernie Ammons Did enc her to quit smoking

## 2013-12-15 NOTE — Assessment & Plan Note (Signed)
bp in fair control at this time  BP Readings from Last 1 Encounters:  12/11/13 132/88   No changes needed Disc lifstyle change with low sodium diet and exercise  Off lasix and benicar bp is ok  Will resume lasix for edema however and continue to follow  Labs reviewed

## 2013-12-15 NOTE — Assessment & Plan Note (Signed)
IFOB card given

## 2013-12-18 ENCOUNTER — Telehealth: Payer: Self-pay | Admitting: Family Medicine

## 2013-12-18 DIAGNOSIS — R195 Other fecal abnormalities: Secondary | ICD-10-CM | POA: Insufficient documentation

## 2013-12-18 NOTE — Telephone Encounter (Signed)
-----   Message from Tammi Sou, [place] sent at 12/18/2013  5:08 PM EST ----- Pt's husband notified of lab results/ Ifob and agrees with referral to GI, I advise him Marion/Linda will call him to scheduled appt., please put in referral

## 2013-12-19 ENCOUNTER — Encounter: Payer: Self-pay | Admitting: Family Medicine

## 2014-01-08 ENCOUNTER — Other Ambulatory Visit: Payer: Self-pay | Admitting: Family Medicine

## 2014-02-10 ENCOUNTER — Encounter (HOSPITAL_COMMUNITY): Payer: Self-pay | Admitting: Vascular Surgery

## 2014-02-10 ENCOUNTER — Emergency Department (HOSPITAL_COMMUNITY): Payer: Medicare Other

## 2014-02-10 ENCOUNTER — Inpatient Hospital Stay (HOSPITAL_COMMUNITY)
Admission: EM | Admit: 2014-02-10 | Discharge: 2014-02-13 | DRG: 872 | Disposition: A | Discharge status: Home / Self Care | Payer: Medicare Other | Attending: Internal Medicine | Admitting: Internal Medicine

## 2014-02-10 DIAGNOSIS — E079 Disorder of thyroid, unspecified: Secondary | ICD-10-CM | POA: Diagnosis present

## 2014-02-10 DIAGNOSIS — F319 Bipolar disorder, unspecified: Secondary | ICD-10-CM | POA: Diagnosis present

## 2014-02-10 DIAGNOSIS — J9610 Chronic respiratory failure, unspecified whether with hypoxia or hypercapnia: Secondary | ICD-10-CM | POA: Diagnosis present

## 2014-02-10 DIAGNOSIS — H9190 Unspecified hearing loss, unspecified ear: Secondary | ICD-10-CM | POA: Diagnosis present

## 2014-02-10 DIAGNOSIS — J449 Chronic obstructive pulmonary disease, unspecified: Secondary | ICD-10-CM | POA: Diagnosis present

## 2014-02-10 DIAGNOSIS — B964 Proteus (mirabilis) (morganii) as the cause of diseases classified elsewhere: Secondary | ICD-10-CM | POA: Diagnosis present

## 2014-02-10 DIAGNOSIS — N179 Acute kidney failure, unspecified: Secondary | ICD-10-CM | POA: Diagnosis present

## 2014-02-10 DIAGNOSIS — I10 Essential (primary) hypertension: Secondary | ICD-10-CM | POA: Diagnosis present

## 2014-02-10 DIAGNOSIS — R509 Fever, unspecified: Secondary | ICD-10-CM | POA: Diagnosis present

## 2014-02-10 DIAGNOSIS — E876 Hypokalemia: Secondary | ICD-10-CM | POA: Diagnosis present

## 2014-02-10 DIAGNOSIS — Z9981 Dependence on supplemental oxygen: Secondary | ICD-10-CM

## 2014-02-10 DIAGNOSIS — A419 Sepsis, unspecified organism: Secondary | ICD-10-CM | POA: Diagnosis present

## 2014-02-10 DIAGNOSIS — E785 Hyperlipidemia, unspecified: Secondary | ICD-10-CM | POA: Diagnosis present

## 2014-02-10 DIAGNOSIS — M1990 Unspecified osteoarthritis, unspecified site: Secondary | ICD-10-CM | POA: Diagnosis present

## 2014-02-10 DIAGNOSIS — F17210 Nicotine dependence, cigarettes, uncomplicated: Secondary | ICD-10-CM | POA: Diagnosis present

## 2014-02-10 DIAGNOSIS — Z87891 Personal history of nicotine dependence: Secondary | ICD-10-CM

## 2014-02-10 DIAGNOSIS — N390 Urinary tract infection, site not specified: Secondary | ICD-10-CM | POA: Diagnosis present

## 2014-02-10 HISTORY — DX: Unspecified kidney failure: N19

## 2014-02-10 HISTORY — DX: Urinary tract infection, site not specified: N39.0

## 2014-02-10 HISTORY — DX: Sepsis, unspecified organism: A41.9

## 2014-02-10 HISTORY — DX: Unspecified Escherichia coli (E. coli) as the cause of diseases classified elsewhere: B96.20

## 2014-02-10 LAB — COMPREHENSIVE METABOLIC PANEL
ALK PHOS: 78 U/L (ref 39–117)
ALT: 17 U/L (ref 0–35)
ANION GAP: 7 (ref 5–15)
AST: 33 U/L (ref 0–37)
Albumin: 3 g/dL — ABNORMAL LOW (ref 3.5–5.2)
BILIRUBIN TOTAL: 0.4 mg/dL (ref 0.3–1.2)
BUN: 23 mg/dL (ref 6–23)
CALCIUM: 9.3 mg/dL (ref 8.4–10.5)
CHLORIDE: 93 mmol/L — AB (ref 96–112)
CO2: 32 mmol/L (ref 19–32)
CREATININE: 1.23 mg/dL — AB (ref 0.50–1.10)
GFR calc Af Amer: 53 mL/min — ABNORMAL LOW (ref >90)
GFR calc non Af Amer: 45 mL/min — ABNORMAL LOW (ref >90)
GLUCOSE: 161 mg/dL — AB (ref 70–99)
Potassium: 3 mmol/L — ABNORMAL LOW (ref 3.5–5.1)
Sodium: 132 mmol/L — ABNORMAL LOW (ref 135–145)
TOTAL PROTEIN: 6.4 g/dL (ref 6.0–8.3)

## 2014-02-10 LAB — CBC WITH DIFFERENTIAL/PLATELET
BASOS PCT: 0 % (ref 0–1)
Basophils Absolute: 0 10*3/uL (ref 0.0–0.1)
Eosinophils Absolute: 0 10*3/uL (ref 0.0–0.7)
Eosinophils Relative: 0 % (ref 0–5)
HEMATOCRIT: 31.2 % — AB (ref 36.0–46.0)
Hemoglobin: 10.6 g/dL — ABNORMAL LOW (ref 12.0–15.0)
Lymphocytes Relative: 3 % — ABNORMAL LOW (ref 12–46)
Lymphs Abs: 0.5 10*3/uL — ABNORMAL LOW (ref 0.7–4.0)
MCH: 29.8 pg (ref 26.0–34.0)
MCHC: 34 g/dL (ref 30.0–36.0)
MCV: 87.6 fL (ref 78.0–100.0)
Monocytes Absolute: 1.1 10*3/uL — ABNORMAL HIGH (ref 0.1–1.0)
Monocytes Relative: 8 % (ref 3–12)
Neutro Abs: 12.5 10*3/uL — ABNORMAL HIGH (ref 1.7–7.7)
Neutrophils Relative %: 89 % — ABNORMAL HIGH (ref 43–77)
Platelets: 248 10*3/uL (ref 150–400)
RBC: 3.56 MIL/uL — ABNORMAL LOW (ref 3.87–5.11)
RDW: 14 % (ref 11.5–15.5)
WBC: 14.2 10*3/uL — ABNORMAL HIGH (ref 4.0–10.5)

## 2014-02-10 LAB — URINE MICROSCOPIC-ADD ON

## 2014-02-10 LAB — URINALYSIS, ROUTINE W REFLEX MICROSCOPIC
Bilirubin Urine: NEGATIVE
Glucose, UA: NEGATIVE mg/dL
Ketones, ur: NEGATIVE mg/dL
Nitrite: NEGATIVE
Protein, ur: 100 mg/dL — AB
Specific Gravity, Urine: 1.013 (ref 1.005–1.030)
UROBILINOGEN UA: 0.2 mg/dL (ref 0.0–1.0)
pH: 5.5 (ref 5.0–8.0)

## 2014-02-10 LAB — LACTIC ACID, PLASMA: LACTIC ACID, VENOUS: 0.9 mmol/L (ref 0.5–2.0)

## 2014-02-10 MED ORDER — SODIUM CHLORIDE 0.9 % IV BOLUS (SEPSIS)
1000.0000 mL | Freq: Once | INTRAVENOUS | Status: AC
  Administered 2014-02-10: 1000 mL via INTRAVENOUS

## 2014-02-10 MED ORDER — HEPARIN SODIUM (PORCINE) 5000 UNIT/ML IJ SOLN
5000.0000 [IU] | Freq: Three times a day (TID) | INTRAMUSCULAR | Status: DC
  Administered 2014-02-10 – 2014-02-12 (×6): 5000 [IU] via SUBCUTANEOUS
  Filled 2014-02-10 (×10): qty 1

## 2014-02-10 MED ORDER — TIOTROPIUM BROMIDE MONOHYDRATE 18 MCG IN CAPS
18.0000 ug | ORAL_CAPSULE | Freq: Every day | RESPIRATORY_TRACT | Status: DC
  Administered 2014-02-11 – 2014-02-13 (×3): 18 ug via RESPIRATORY_TRACT
  Filled 2014-02-10: qty 5

## 2014-02-10 MED ORDER — ALBUTEROL SULFATE (2.5 MG/3ML) 0.083% IN NEBU
2.5000 mg | INHALATION_SOLUTION | Freq: Four times a day (QID) | RESPIRATORY_TRACT | Status: DC | PRN
  Administered 2014-02-12: 2.5 mg via RESPIRATORY_TRACT
  Filled 2014-02-10: qty 3

## 2014-02-10 MED ORDER — DEXTROSE 5 % IV SOLN
1.0000 g | Freq: Once | INTRAVENOUS | Status: AC
  Administered 2014-02-10: 1 g via INTRAVENOUS
  Filled 2014-02-10: qty 10

## 2014-02-10 MED ORDER — BUSPIRONE HCL 15 MG PO TABS
15.0000 mg | ORAL_TABLET | Freq: Two times a day (BID) | ORAL | Status: DC
  Administered 2014-02-10 – 2014-02-13 (×6): 15 mg via ORAL
  Filled 2014-02-10 (×8): qty 1

## 2014-02-10 MED ORDER — BUPROPION HCL ER (XL) 300 MG PO TB24
300.0000 mg | ORAL_TABLET | Freq: Every day | ORAL | Status: DC
  Administered 2014-02-10 – 2014-02-13 (×4): 300 mg via ORAL
  Filled 2014-02-10 (×4): qty 1

## 2014-02-10 MED ORDER — DEXTROSE 5 % IV SOLN: 1.0000 g | INTRAVENOUS | Status: DC

## 2014-02-10 MED ORDER — ACETAMINOPHEN 325 MG PO TABS
650.0000 mg | ORAL_TABLET | Freq: Four times a day (QID) | ORAL | Status: DC | PRN
  Administered 2014-02-10 – 2014-02-12 (×3): 650 mg via ORAL
  Filled 2014-02-10 (×3): qty 2

## 2014-02-10 MED ORDER — AMLODIPINE BESYLATE 10 MG PO TABS
10.0000 mg | ORAL_TABLET | Freq: Every day | ORAL | Status: DC
  Administered 2014-02-11 – 2014-02-12 (×2): 10 mg via ORAL
  Filled 2014-02-10 (×3): qty 1

## 2014-02-10 MED ORDER — TAMSULOSIN HCL 0.4 MG PO CAPS
0.4000 mg | ORAL_CAPSULE | Freq: Every day | ORAL | Status: DC
  Administered 2014-02-11 – 2014-02-13 (×3): 0.4 mg via ORAL
  Filled 2014-02-10 (×3): qty 1

## 2014-02-10 MED ORDER — SODIUM CHLORIDE 0.9 % IV SOLN
INTRAVENOUS | Status: DC
  Administered 2014-02-10 – 2014-02-11 (×2): via INTRAVENOUS

## 2014-02-10 MED ORDER — LEVOTHYROXINE SODIUM 100 MCG PO TABS
100.0000 ug | ORAL_TABLET | Freq: Every day | ORAL | Status: DC
Start: 2014-02-11 — End: 2014-02-13
  Administered 2014-02-11 – 2014-02-13 (×3): 100 ug via ORAL
  Filled 2014-02-10 (×4): qty 1

## 2014-02-10 MED ORDER — MOMETASONE FURO-FORMOTEROL FUM 100-5 MCG/ACT IN AERO
2.0000 | INHALATION_SPRAY | Freq: Two times a day (BID) | RESPIRATORY_TRACT | Status: DC
  Administered 2014-02-11 – 2014-02-13 (×5): 2 via RESPIRATORY_TRACT
  Filled 2014-02-10: qty 8.8

## 2014-02-10 MED ORDER — CEFTRIAXONE SODIUM IN DEXTROSE 20 MG/ML IV SOLN
1.0000 g | INTRAVENOUS | Status: DC
  Administered 2014-02-11 – 2014-02-12 (×2): 1 g via INTRAVENOUS
  Filled 2014-02-10 (×3): qty 50

## 2014-02-10 MED ORDER — ALBUTEROL 90 MCG/ACT IN AERS: 2.0000 | INHALATION_SPRAY | Freq: Four times a day (QID) | RESPIRATORY_TRACT | Status: DC | PRN

## 2014-02-10 MED ORDER — ARIPIPRAZOLE 10 MG PO TABS
20.0000 mg | ORAL_TABLET | Freq: Every day | ORAL | Status: DC
Start: 2014-02-10 — End: 2014-02-13
  Administered 2014-02-10 – 2014-02-12 (×3): 20 mg via ORAL
  Filled 2014-02-10 (×4): qty 2

## 2014-02-10 MED ORDER — TAMSULOSIN HCL 0.4 MG PO CAPS: 0.4000 mg | ORAL_CAPSULE | Freq: Every day | ORAL | Status: DC

## 2014-02-10 MED ORDER — ATORVASTATIN CALCIUM 10 MG PO TABS
10.0000 mg | ORAL_TABLET | Freq: Every day | ORAL | Status: DC
  Administered 2014-02-11 – 2014-02-12 (×2): 10 mg via ORAL
  Filled 2014-02-10 (×3): qty 1

## 2014-02-10 MED ORDER — OXCARBAZEPINE 300 MG PO TABS
600.0000 mg | ORAL_TABLET | Freq: Two times a day (BID) | ORAL | Status: DC
  Administered 2014-02-10 – 2014-02-13 (×6): 600 mg via ORAL
  Filled 2014-02-10 (×7): qty 2

## 2014-02-10 MED ORDER — FLUTICASONE PROPIONATE 50 MCG/ACT NA SUSP
1.0000 | Freq: Two times a day (BID) | NASAL | Status: DC
  Administered 2014-02-11 – 2014-02-13 (×5): 1 via NASAL
  Filled 2014-02-10: qty 16

## 2014-02-10 MED ORDER — TRAZODONE HCL 150 MG PO TABS
150.0000 mg | ORAL_TABLET | Freq: Every day | ORAL | Status: DC
  Administered 2014-02-10 – 2014-02-12 (×3): 150 mg via ORAL
  Filled 2014-02-10 (×4): qty 1

## 2014-02-10 MED ORDER — CLONAZEPAM 0.5 MG PO TABS
0.5000 mg | ORAL_TABLET | Freq: Two times a day (BID) | ORAL | Status: DC | PRN
  Administered 2014-02-11: 0.5 mg via ORAL
  Filled 2014-02-10: qty 1

## 2014-02-10 NOTE — H&P (Signed)
[facility] History and Physical  Martha Patton UEA:[id number] DOB: 03-02-1950 DOA: 02/10/2014  Referring physician: EDP PCP: Loura Pardon, MD   Chief Complaint: Fever   HPI: Martha Patton is a 64 y.o. female who presents to the ED with c/o fever, generalized weakness, urinary frequency.  Symptoms have been ongoing for the past day or so.  Last time she had symptoms they developed into UTI with systemic sepsis, AKI, and were associated with a hospital stay at [facility] last summer.  As a result she didn't wait this time and presented to the ED.  Review of Systems: Systems reviewed.  As above, otherwise negative  Past Medical History  Diagnosis Date  . COPD (chronic obstructive pulmonary disease)   . Hypertension   . Thyroid disease   . Osteoarthritis   . Bipolar 1 disorder   . Cancer of kidney   . Urinary retention   . Deafness     cochlear implants bilat  . Hyperlipidemia   . Allergy   . Macular degeneration     left eye  . Osteopenia   . Obstructive sleep apnea on CPAP   . E. coli UTI (urinary tract infection)   . Sepsis   . Renal failure    Past Surgical History  Procedure Laterality Date  . Appendectomy    . Cholecystectomy    . Abdominal hysterectomy    . Bladder suspension      bladder tack  . Nephrectomy      left  . Rotator cuff repair    . Tonsillectomy    . External ear surgery      x3  . Tibia fracture surgery      as a child  . Liver biopsy  02/1998    cysts  . Leep  2002  . Septoplasty  06/2003  . Cervical disc surgery     Social History:  reports that she has been smoking Cigarettes.  She has a 20 pack-year smoking history. She does not have any smokeless tobacco history on file. She reports that she drinks alcohol. She reports that she does not use illicit drugs.  Allergies  Allergen Reactions  . Ezetimibe     REACTION: elevated lfts  . Fluvastatin Sodium     REACTION: elevated LFT's  . Naproxen     REACTION: GI    History reviewed.  No pertinent family history.   Prior to Admission medications   Medication Sig Start Date End Date Taking? Authorizing Provider  Aclidinium Bromide (TUDORZA PRESSAIR) 400 MCG/ACT AEPB Inhale 400 mcg into the lungs every 12 (twelve) hours.   Yes Historical Provider, MD  albuterol (PROVENTIL,VENTOLIN) 90 MCG/ACT inhaler Inhale 2 puffs into the lungs every 6 (six) hours as needed. For shortness of breath   Yes Historical Provider, MD  amLODipine-benazepril (LOTREL) 10-20 MG per capsule TAKE 1 CAPSULE BY MOUTH DAILY. 01/08/14  Yes Abner Greenspan, MD  ARIPiprazole (ABILIFY) 20 MG tablet Take 20 mg by mouth at bedtime.   Yes Historical Provider, MD  atorvastatin (LIPITOR) 10 MG tablet TAKE 1 TABLET (10 MG TOTAL) BY MOUTH DAILY. 09/21/13  Yes Abner Greenspan, MD  benazepril (LOTENSIN) 20 MG tablet Take 1 tablet (20 mg total) by mouth daily. 09/21/13  Yes Abner Greenspan, MD  buPROPion (WELLBUTRIN XL) 300 MG 24 hr tablet Take 300 mg by mouth daily.    Yes Historical Provider, MD  busPIRone (BUSPAR) 15 MG tablet Take 15 mg by mouth 2 (two) times  daily.     Yes Historical Provider, MD  clonazePAM (KLONOPIN) 0.5 MG tablet Take 0.5 mg by mouth 2 (two) times daily as needed. For nerves   Yes Historical Provider, MD  fluticasone (FLONASE) 50 MCG/ACT nasal spray 1 spray by Nasal route 2 (two) times daily.     Yes Historical Provider, MD  Fluticasone-Salmeterol (ADVAIR DISKUS) 250-50 MCG/DOSE AEPB Inhale 2 puffs into the lungs 2 (two) times daily.    Yes Historical Provider, MD  furosemide (LASIX) 40 MG tablet Take 1 tablet (40 mg total) by mouth daily. 12/11/13  Yes Abner Greenspan, MD  ibuprofen (ADVIL,MOTRIN) 200 MG tablet Take 200-400 mg by mouth every 6 (six) hours as needed. For pain   Yes Historical Provider, MD  levothyroxine (SYNTHROID, LEVOTHROID) 100 MCG tablet TAKE 1 TABLET (100 MCG TOTAL) BY MOUTH DAILY. 01/08/14  Yes Abner Greenspan, MD  Melatonin 3 MG TABS Takes 3-10 mg per night   Yes Historical Provider, MD   Multiple Vitamins-Minerals (CENTRUM SILVER PO) Take 1 tablet by mouth daily.    Yes Historical Provider, MD  oxcarbazepine (TRILEPTAL) 600 MG tablet Take 600 mg by mouth 2 (two) times daily.    Yes Historical Provider, MD  potassium chloride (KLOR-CON 10) 10 MEQ tablet Take 1 tablet (10 mEq total) by mouth 3 (three) times daily. 12/11/13  Yes Abner Greenspan, MD  Tamsulosin HCl (FLOMAX) 0.4 MG CAPS Take 0.4 mg by mouth daily.    Yes Historical Provider, MD  traZODone (DESYREL) 100 MG tablet Take 150 mg by mouth at bedtime.    Yes Historical Provider, MD   Physical Exam: Filed Vitals:   02/10/14 2015  BP: 111/60  Pulse: 92  Temp:   Resp: 19    BP 111/60 mmHg  Pulse 92  Temp(Src) 98.4 F (36.9 C) (Oral)  Resp 19  SpO2 96%  LMP 01/20/2000  General Appearance:    Alert, oriented, no distress, appears stated age  Head:    Normocephalic, atraumatic  Eyes:    PERRL, EOMI, sclera non-icteric        Nose:   Nares without drainage or epistaxis. Mucosa, turbinates normal  Throat:   Moist mucous membranes. Oropharynx without erythema or exudate.  Neck:   Supple. No carotid bruits.  No thyromegaly.  No lymphadenopathy.   Back:     No CVA tenderness, no spinal tenderness  Lungs:     Clear to auscultation bilaterally, without wheezes, rhonchi or rales  Chest wall:    No tenderness to palpitation  Heart:    Regular rate and rhythm without murmurs, gallops, rubs  Abdomen:     Soft, non-tender, nondistended, normal bowel sounds, no organomegaly  Genitalia:    deferred  Rectal:    deferred  Extremities:   No clubbing, cyanosis or edema.  Pulses:   2+ and symmetric all extremities  Skin:   Skin color, texture, turgor normal, no rashes or lesions  Lymph nodes:   Cervical, supraclavicular, and axillary nodes normal  Neurologic:   CNII-XII intact. Normal strength, sensation and reflexes      throughout    Labs on Admission:  Basic Metabolic Panel:  Recent Labs Lab 02/10/14 1842  NA 132*   K 3.0*  CL 93*  CO2 32  GLUCOSE 161*  BUN 23  CREATININE 1.23*  CALCIUM 9.3   Liver Function Tests:  Recent Labs Lab 02/10/14 1842  AST 33  ALT 17  ALKPHOS 78  BILITOT 0.4  PROT 6.4  ALBUMIN 3.0*   No results for input(s): LIPASE, AMYLASE in the last 168 hours. No results for input(s): AMMONIA in the last 168 hours. CBC:  Recent Labs Lab 02/10/14 1842  WBC 14.2*  NEUTROABS 12.5*  HGB 10.6*  HCT 31.2*  MCV 87.6  PLT 248   Cardiac Enzymes: No results for input(s): CKTOTAL, CKMB, CKMBINDEX, TROPONINI in the last 168 hours.  BNP (last 3 results) No results for input(s): PROBNP in the last 8760 hours. CBG: No results for input(s): GLUCAP in the last 168 hours.  Radiological Exams on Admission: Dg Chest 2 View  02/10/2014   CLINICAL DATA:  Fever, COPD  EXAM: CHEST  2 VIEW  COMPARISON:  03/30/2011  FINDINGS: Cardiomediastinal silhouette is stable. Mild hyperinflation. No acute infiltrate or pleural effusion. No pulmonary edema. Mild degenerative changes thoracic spine and bilateral shoulders.  IMPRESSION: No active disease. Mild hyperinflation. Degenerative changes thoracic spine and bilateral shoulders.   Electronically Signed   By: Lahoma Crocker M.D.   On: 02/10/2014 19:04    EKG: Independently reviewed.  Assessment/Plan Principal Problem:   Sepsis secondary to UTI Active Problems:   AKI (acute kidney injury)   1. Sepsis secondary to UTI - UA suggestive of UTI, patient also has leukocytosis of 14k, tachycardia of 114 that has resolved after IVF, temperature of 100.2. 1. Tylenol for fever 2. IVF at 125 cc/hr, hold lasix 3. Repeat CBC in AM to trend WBC 4. Bladder scan, put foley in if patient has urinary retention. 2. AKI - creatinine up to 1.23 from 0.8 baseline.  Hydrating with IVF, holding ACEi, holding lasix, holding NSAIDs, repeat BMP in AM. 3. HTN - continue norvasc, holding ACEi, holding lasix   Code Status: Full Code  Family Communication: Family at  bedside Disposition Plan: Admit to inpatient   Time spent: 60 min  GARDNER, JARED M. [facility] Pager [phone]  If 7AM-7PM, please contact the day team taking care of the patient [email or web address] Password [email or web address] 02/10/2014, 8:22 PM

## 2014-02-10 NOTE — ED Provider Notes (Signed)
CSN: [id number]     Arrival date & time 02/10/14  1754 History   First MD Initiated Contact with Patient 02/10/14 1800     Chief Complaint  Patient presents with  . Extremity Weakness     (Consider location/radiation/quality/duration/timing/severity/associated sxs/prior Treatment) Patient is a 64 y.o. female presenting with weakness. The history is provided by the patient. No language interpreter was used.  Weakness This is a new problem. The current episode started yesterday. The problem occurs constantly. The problem has been unchanged. Associated symptoms include a fever and weakness (generalized). Pertinent negatives include no abdominal pain, chills, coughing, diaphoresis, fatigue, myalgias, nausea, numbness, rash, swollen glands, vertigo or visual change. Nothing aggravates the symptoms. She has tried nothing for the symptoms.    Past Medical History  Diagnosis Date  . COPD (chronic obstructive pulmonary disease)   . Hypertension   . Thyroid disease   . Osteoarthritis   . Bipolar 1 disorder   . Cancer of kidney   . Urinary retention   . Deafness     cochlear implants bilat  . Hyperlipidemia   . Allergy   . Macular degeneration     left eye  . Osteopenia   . Obstructive sleep apnea on CPAP   . E. coli UTI (urinary tract infection)   . Sepsis   . Renal failure    Past Surgical History  Procedure Laterality Date  . Appendectomy    . Cholecystectomy    . Abdominal hysterectomy    . Bladder suspension      bladder tack  . Nephrectomy      left  . Rotator cuff repair    . Tonsillectomy    . External ear surgery      x3  . Tibia fracture surgery      as a child  . Liver biopsy  02/1998    cysts  . Leep  2002  . Septoplasty  06/2003  . Cervical disc surgery     History reviewed. No pertinent family history. History  Substance Use Topics  . Smoking status: Current Every Day Smoker -- 0.50 packs/day for 40 years    Types: Cigarettes  . Smokeless tobacco: Not  on file     Comment: has quit in the past on and off   . Alcohol Use: 0.0 oz/week    0 Not specified per week     Comment: rare   OB History    No data available     Review of Systems  Constitutional: Positive for fever. Negative for chills, diaphoresis and fatigue.  Respiratory: Negative for cough.   Gastrointestinal: Negative for nausea and abdominal pain.  Genitourinary: Positive for frequency. Negative for dysuria and urgency.  Musculoskeletal: Negative for myalgias and back pain.  Skin: Negative for rash.  Neurological: Positive for weakness (generalized). Negative for vertigo and numbness.  All other systems reviewed and are negative.     Allergies  Ezetimibe; Fluvastatin sodium; and Naproxen  Home Medications   Prior to Admission medications   Medication Sig Start Date End Date Taking? Authorizing Provider  Aclidinium Bromide (TUDORZA PRESSAIR) 400 MCG/ACT AEPB Inhale 400 mcg into the lungs every 12 (twelve) hours.   Yes Historical Provider, MD  albuterol (PROVENTIL,VENTOLIN) 90 MCG/ACT inhaler Inhale 2 puffs into the lungs every 6 (six) hours as needed. For shortness of breath   Yes Historical Provider, MD  amLODipine-benazepril (LOTREL) 10-20 MG per capsule TAKE 1 CAPSULE BY MOUTH DAILY. 01/08/14  Yes [employer],  MD  ARIPiprazole (ABILIFY) 20 MG tablet Take 20 mg by mouth at bedtime.   Yes Historical Provider, MD  atorvastatin (LIPITOR) 10 MG tablet TAKE 1 TABLET (10 MG TOTAL) BY MOUTH DAILY. 09/21/13  Yes Abner Greenspan, MD  benazepril (LOTENSIN) 20 MG tablet Take 1 tablet (20 mg total) by mouth daily. 09/21/13  Yes Abner Greenspan, MD  buPROPion (WELLBUTRIN XL) 300 MG 24 hr tablet Take 300 mg by mouth daily.    Yes Historical Provider, MD  busPIRone (BUSPAR) 15 MG tablet Take 15 mg by mouth 2 (two) times daily.     Yes Historical Provider, MD  clonazePAM (KLONOPIN) 0.5 MG tablet Take 0.5 mg by mouth 2 (two) times daily as needed. For nerves   Yes Historical Provider, MD   fluticasone (FLONASE) 50 MCG/ACT nasal spray 1 spray by Nasal route 2 (two) times daily.     Yes Historical Provider, MD  Fluticasone-Salmeterol (ADVAIR DISKUS) 250-50 MCG/DOSE AEPB Inhale 2 puffs into the lungs 2 (two) times daily.    Yes Historical Provider, MD  furosemide (LASIX) 40 MG tablet Take 1 tablet (40 mg total) by mouth daily. 12/11/13  Yes Abner Greenspan, MD  ibuprofen (ADVIL,MOTRIN) 200 MG tablet Take 200-400 mg by mouth every 6 (six) hours as needed. For pain   Yes Historical Provider, MD  levothyroxine (SYNTHROID, LEVOTHROID) 100 MCG tablet TAKE 1 TABLET (100 MCG TOTAL) BY MOUTH DAILY. 01/08/14  Yes Abner Greenspan, MD  Melatonin 3 MG TABS Takes 3-10 mg per night   Yes Historical Provider, MD  Multiple Vitamins-Minerals (CENTRUM SILVER PO) Take 1 tablet by mouth daily.    Yes Historical Provider, MD  oxcarbazepine (TRILEPTAL) 600 MG tablet Take 600 mg by mouth 2 (two) times daily.    Yes Historical Provider, MD  potassium chloride (KLOR-CON 10) 10 MEQ tablet Take 1 tablet (10 mEq total) by mouth 3 (three) times daily. 12/11/13  Yes Abner Greenspan, MD  Tamsulosin HCl (FLOMAX) 0.4 MG CAPS Take 0.4 mg by mouth daily.    Yes Historical Provider, MD  traZODone (DESYREL) 100 MG tablet Take 150 mg by mouth at bedtime.    Yes Historical Provider, MD   BP 113/72 mmHg  Pulse 93  Temp(Src) 98.4 F (36.9 C) (Oral)  Resp 26  SpO2 97%  LMP 01/20/2000 Physical Exam  Constitutional: She is oriented to person, place, and time. She appears well-developed and well-nourished. No distress.  HENT:  Head: Normocephalic and atraumatic.  Eyes: Pupils are equal, round, and reactive to light.  Neck: Normal range of motion.  Cardiovascular: Normal rate, regular rhythm, normal heart sounds and intact distal pulses.   Pulmonary/Chest: Effort normal. No respiratory distress. She has no wheezes. She exhibits no tenderness.  Abdominal: Soft. Bowel sounds are normal. She exhibits no distension. There is no  tenderness. There is no rebound and no guarding.  Neurological: She is alert and oriented to person, place, and time. She has normal strength. No cranial nerve deficit or sensory deficit. She exhibits normal muscle tone. Coordination normal.  Reflex Scores:      Patellar reflexes are 2+ on the right side and 2+ on the left side. Strength 5/5 bilateral upper and lower extremities.  Sensation intact x4 extremities to light touch.  CN II-XII intact.    Skin: Skin is warm and dry.  Nursing note and vitals reviewed.   ED Course  Procedures (including critical care time) Labs Review Labs Reviewed  CBC WITH DIFFERENTIAL/PLATELET -  Abnormal; Notable for the following:    WBC 14.2 (*)    RBC 3.56 (*)    Hemoglobin 10.6 (*)    HCT 31.2 (*)    Neutrophils Relative % 89 (*)    Neutro Abs 12.5 (*)    Lymphocytes Relative 3 (*)    Lymphs Abs 0.5 (*)    Monocytes Absolute 1.1 (*)    All other components within normal limits  COMPREHENSIVE METABOLIC PANEL - Abnormal; Notable for the following:    Sodium 132 (*)    Potassium 3.0 (*)    Chloride 93 (*)    Glucose, Bld 161 (*)    Creatinine, Ser 1.23 (*)    Albumin 3.0 (*)    GFR calc non Af Amer 45 (*)    GFR calc Af Amer 53 (*)    All other components within normal limits  URINALYSIS, ROUTINE W REFLEX MICROSCOPIC - Abnormal; Notable for the following:    APPearance CLOUDY (*)    Hgb urine dipstick MODERATE (*)    Protein, ur 100 (*)    Leukocytes, UA MODERATE (*)    All other components within normal limits  URINE MICROSCOPIC-ADD ON - Abnormal; Notable for the following:    Bacteria, UA FEW (*)    All other components within normal limits  URINE CULTURE  CULTURE, BLOOD (ROUTINE X 2)  CULTURE, BLOOD (ROUTINE X 2)  LACTIC ACID, PLASMA    Imaging Review Dg Chest 2 View  02/10/2014   CLINICAL DATA:  Fever, COPD  EXAM: CHEST  2 VIEW  COMPARISON:  03/30/2011  FINDINGS: Cardiomediastinal silhouette is stable. Mild hyperinflation. No acute  infiltrate or pleural effusion. No pulmonary edema. Mild degenerative changes thoracic spine and bilateral shoulders.  IMPRESSION: No active disease. Mild hyperinflation. Degenerative changes thoracic spine and bilateral shoulders.   Electronically Signed   By: Lahoma Crocker M.D.   On: 02/10/2014 19:04     EKG Interpretation   Date/Time:  Saturday February 10 2014 17:59:27 EST Ventricular Rate:  115 PR Interval:  157 QRS Duration: 134 QT Interval:  315 QTC Calculation: 436 R Axis:   -119 Text Interpretation:  Sinus tachycardia Probable left atrial enlargement  Right bundle branch block similar in appearance to ecg on 26-March-2011 No  significant change since last tracing Confirmed by HARRISON  MD, FORREST  ([id number]) on 02/10/2014 6:04:48 PM      MDM   Final diagnoses:  Fever  Sepsis secondary to UTI  AKI (acute kidney injury)    Patient is a 64 year old Caucasian female with pertinent past medical history of sepsis from a urinary tract infection who comes to the emergency department today with chills, generalized body aches, and leg weakness similar to her previous urinary tract infections for the past day. Physical exam as above. Upon arrival patient is tachycardic and tachypneic concerning for sepsis. As a result a sepsis workup was obtained to evaluate for a urinary tract infection, pneumonia, or bacteremia. Patient does not have any focal neurologic deficits as a result doubt a CVA. Initial workup included a UA, CBC, CMP, lactic acid, chest x-ray, and an EKG. Chest x-ray was unremarkable with no consolidations as a result I doubt a pneumonia. Patient does have COPD and typically wears 3 L of oxygen. She is satting above 90% on her baseline home O2. Lactic acid is 0.9. CMP with a creatinine of 1.23 up from the patient's baseline concerning for an AKI.  CBC had an elevated white count of 14.2 otherwise  unremarkable. UA demonstrated bacteria and 21-50 WBCs concerning for a urinary tract  infection. As a result the patient was treated with Rocephin and a code sepsis was called. Patient was felt to require admission to the hospital for sepsis from a urinary tract infection. Patient was treated with 2 L of normal saline in the Emergency Department with improvement in her tachycardia from the 110s down to the 90s. The patient is stable for the floor at this time. Patient is admitted to the hospitalist service in a good condition. Labs and imaging reviewed by myself and considered in medical decision-making. Imaging was interpreted radiology. Care was discussed with my attending Dr. Aline Brochure.      Katheren Shams, MD 02/10/14 [id number]  Pamella Pert, MD 02/10/14 2102

## 2014-02-10 NOTE — ED Notes (Signed)
Pt reports to the ED for eval of bilateral leg weakness, dizziness, and a fall that occurred today. Reports increased urinary frequency. Reports she was down for approx 25 minutes. She denies any head injury or LOC. She is tachycardic at 110s. Denies any pain. SCCA negative. Pt has hx of COPD and wears 3 L via nasal cannula at all times. Pt 100.2 oral temperature. Denies any CP or SOB. Pt A&Ox4, resp e/u, and skin warm and dry.

## 2014-02-10 NOTE — ED Notes (Signed)
Dr. Gardner at bedside 

## 2014-02-11 DIAGNOSIS — J9610 Chronic respiratory failure, unspecified whether with hypoxia or hypercapnia: Secondary | ICD-10-CM | POA: Diagnosis present

## 2014-02-11 DIAGNOSIS — N390 Urinary tract infection, site not specified: Secondary | ICD-10-CM | POA: Diagnosis present

## 2014-02-11 DIAGNOSIS — J9611 Chronic respiratory failure with hypoxia: Secondary | ICD-10-CM

## 2014-02-11 DIAGNOSIS — E876 Hypokalemia: Secondary | ICD-10-CM | POA: Diagnosis present

## 2014-02-11 LAB — CBC
HEMATOCRIT: 27.8 % — AB (ref 36.0–46.0)
Hemoglobin: 9.3 g/dL — ABNORMAL LOW (ref 12.0–15.0)
MCH: 29.6 pg (ref 26.0–34.0)
MCHC: 33.5 g/dL (ref 30.0–36.0)
MCV: 88.5 fL (ref 78.0–100.0)
Platelets: 193 10*3/uL (ref 150–400)
RBC: 3.14 MIL/uL — ABNORMAL LOW (ref 3.87–5.11)
RDW: 14.1 % (ref 11.5–15.5)
WBC: 10.5 10*3/uL (ref 4.0–10.5)

## 2014-02-11 LAB — BASIC METABOLIC PANEL
Anion gap: 10 (ref 5–15)
BUN: 17 mg/dL (ref 6–23)
CALCIUM: 8.2 mg/dL — AB (ref 8.4–10.5)
CO2: 25 mmol/L (ref 19–32)
CREATININE: 1.05 mg/dL (ref 0.50–1.10)
Chloride: 97 mmol/L (ref 96–112)
GFR, EST AFRICAN AMERICAN: 64 mL/min — AB (ref >90)
GFR, EST NON AFRICAN AMERICAN: 55 mL/min — AB (ref >90)
Glucose, Bld: 104 mg/dL — ABNORMAL HIGH (ref 70–99)
Potassium: 2.8 mmol/L — ABNORMAL LOW (ref 3.5–5.1)
SODIUM: 132 mmol/L — AB (ref 135–145)

## 2014-02-11 MED ORDER — POTASSIUM CHLORIDE CRYS ER 20 MEQ PO TBCR
60.0000 meq | EXTENDED_RELEASE_TABLET | Freq: Four times a day (QID) | ORAL | Status: AC
  Administered 2014-02-11 (×2): 60 meq via ORAL
  Filled 2014-02-11 (×2): qty 3

## 2014-02-11 NOTE — Progress Notes (Signed)
[facility] PROGRESS NOTE   Martha Patton OCA:[id number] DOB: 26-Oct-1950 DOA: 02/10/2014 PCP: Roxy Manns, MD  HPI/Subjective: Seen with husband at bedside, feels much better than yesterday.  Assessment/Plan: Principal Problem:   Sepsis secondary to UTI Active Problems:   AKI (acute kidney injury)   Chronic respiratory failure   Hypokalemia   UTI (lower urinary tract infection)    Sepsis Patient met sepsis criteria with respiratory rate of 28, heart rate of 114 and WBCs of 14.2 and presence of infection. Sepsis likely secondary to UTI. Treated with IV fluids, broad-spectrum antibiotics. Await urine and blood cultures.  UTI Urinalysis consistent with UTI, patient started on Rocephin. We'll adjust antibiotics according to the culture results.  Acute kidney injury Patient presented with creatinine of 1.23, baseline creatinine is 0.8. This is improving already with IV fluids hydration.  Hypokalemia Presented with potassium of 3.0, patient is on Lasix at home. Potassium today is 2.8. Replete with oral supplements.  Chronic respiratory failure Patient is on 2 L of oxygen at home chronically secondary to COPD.  Code Status: Full Code Family Communication: Plan discussed with the patient. Disposition Plan: Remains inpatient Diet: Diet Heart  Consultants:  None  Procedures:  None  Antibiotics:  Rocephin   Objective: Filed Vitals:   02/11/14 0529  BP: 91/60  Pulse: 92  Temp: 98.1 F (36.7 C)  Resp: 18    Intake/Output Summary (Last 24 hours) at 02/11/14 1239 Last data filed at 02/11/14 3543  Gross per 24 hour  Intake    480 ml  Output      0 ml  Net    480 ml   Filed Weights   02/10/14 2100  Weight: 67.132 kg (148 lb)    Exam: General: Alert and awake, oriented x3, not in any acute distress. HEENT: anicteric sclera, pupils reactive to light and accommodation, EOMI CVS: S1-S2 clear, no murmur rubs or gallops Chest: clear to auscultation  bilaterally, no wheezing, rales or rhonchi Abdomen: soft nontender, nondistended, normal bowel sounds, no organomegaly Extremities: no cyanosis, clubbing or edema noted bilaterally Neuro: Cranial nerves II-XII intact, no focal neurological deficits  Data Reviewed: Basic Metabolic Panel:  Recent Labs Lab 02/10/14 1842 02/11/14 0515  NA 132* 132*  K 3.0* 2.8*  CL 93* 97  CO2 32 25  GLUCOSE 161* 104*  BUN 23 17  CREATININE 1.23* 1.05  CALCIUM 9.3 8.2*   Liver Function Tests:  Recent Labs Lab 02/10/14 1842  AST 33  ALT 17  ALKPHOS 78  BILITOT 0.4  PROT 6.4  ALBUMIN 3.0*   No results for input(s): LIPASE, AMYLASE in the last 168 hours. No results for input(s): AMMONIA in the last 168 hours. CBC:  Recent Labs Lab 02/10/14 1842 02/11/14 0515  WBC 14.2* 10.5  NEUTROABS 12.5*  --   HGB 10.6* 9.3*  HCT 31.2* 27.8*  MCV 87.6 88.5  PLT 248 193   Cardiac Enzymes: No results for input(s): CKTOTAL, CKMB, CKMBINDEX, TROPONINI in the last 168 hours. BNP (last 3 results) No results for input(s): PROBNP in the last 8760 hours. CBG: No results for input(s): GLUCAP in the last 168 hours.  Micro No results found for this or any previous visit (from the past 240 hour(s)).   Studies: Dg Chest 2 View  02/10/2014   CLINICAL DATA:  Fever, COPD  EXAM: CHEST  2 VIEW  COMPARISON:  03/30/2011  FINDINGS: Cardiomediastinal silhouette is stable. Mild hyperinflation. No acute infiltrate or pleural effusion. No pulmonary edema.  Mild degenerative changes thoracic spine and bilateral shoulders.  IMPRESSION: No active disease. Mild hyperinflation. Degenerative changes thoracic spine and bilateral shoulders.   Electronically Signed   By: Lahoma Crocker M.D.   On: 02/10/2014 19:04    Scheduled Meds: . amLODipine  10 mg Oral Daily  . ARIPiprazole  20 mg Oral QHS  . atorvastatin  10 mg Oral q1800  . buPROPion  300 mg Oral Daily  . busPIRone  15 mg Oral BID  . cefTRIAXone (ROCEPHIN)  IV  1 g  Intravenous Q24H  . fluticasone  1 spray Each Nare BID  . heparin  5,000 Units Subcutaneous 3 times per day  . levothyroxine  100 mcg Oral QAC breakfast  . mometasone-formoterol  2 puff Inhalation BID  . oxcarbazepine  600 mg Oral BID  . potassium chloride  60 mEq Oral Q6H  . tamsulosin  0.4 mg Oral Daily  . tiotropium  18 mcg Inhalation Daily  . traZODone  150 mg Oral QHS   Continuous Infusions: . sodium chloride 125 mL/hr at 02/11/14 5051       Time spent: 35 minutes    [facility] A  [facility] Pager [phone] If 7PM-7AM, please contact night-coverage at [email or web address], password [facility] 02/11/2014, 12:39 PM  LOS: 1 day

## 2014-02-11 NOTE — Progress Notes (Signed)
Utilization review completed.  

## 2014-02-12 LAB — GLUCOSE, CAPILLARY: GLUCOSE-CAPILLARY: 103 mg/dL — AB (ref 70–99)

## 2014-02-12 LAB — BASIC METABOLIC PANEL
Anion gap: 7 (ref 5–15)
BUN: 10 mg/dL (ref 6–23)
CALCIUM: 8.5 mg/dL (ref 8.4–10.5)
CO2: 27 mmol/L (ref 19–32)
Chloride: 102 mmol/L (ref 96–112)
Creatinine, Ser: 0.92 mg/dL (ref 0.50–1.10)
GFR calc Af Amer: 75 mL/min — ABNORMAL LOW (ref >90)
GFR, EST NON AFRICAN AMERICAN: 64 mL/min — AB (ref >90)
Glucose, Bld: 120 mg/dL — ABNORMAL HIGH (ref 70–99)
POTASSIUM: 3.6 mmol/L (ref 3.5–5.1)
Sodium: 136 mmol/L (ref 135–145)

## 2014-02-12 MED ORDER — CETYLPYRIDINIUM CHLORIDE 0.05 % MT LIQD
7.0000 mL | Freq: Two times a day (BID) | OROMUCOSAL | Status: DC
  Administered 2014-02-12 (×2): 7 mL via OROMUCOSAL

## 2014-02-12 NOTE — Progress Notes (Signed)
Pt trying to get out of bed. Having increased dyspnea. Nasal cannula was off. Sats 91% when O2 replaced at 2L, Resp therapy called and Nebulizer treatment given. Lung sounds are wet with increased congestion and wheezing. Pt coughing nonproductively. T Callahan notified and NS reduced. Pt is voiding often and is mostly incontinent. Vtial signs stable. Sats 93% on 3L. Pulse 117. Temp 100.3

## 2014-02-12 NOTE — Progress Notes (Signed)
Daughter taught wet to dry dsg change verbilized understanding

## 2014-02-12 NOTE — Progress Notes (Signed)
[facility] PROGRESS NOTE   SABAH ZUCCO IWL:[id number] DOB: 22-Apr-1950 DOA: 02/10/2014 PCP: Roxy Manns, MD  HPI/Subjective: Reportedly he was short of breath last night. Patient has chronic respiratory failure supposed to be in 2-3 L of oxygen at home.  Assessment/Plan: Principal Problem:   Sepsis secondary to UTI Active Problems:   AKI (acute kidney injury)   Chronic respiratory failure   Hypokalemia   UTI (lower urinary tract infection)    Sepsis Patient met sepsis criteria with respiratory rate of 28, heart rate of 114 and WBCs of 14.2 and presence of infection. Sepsis likely secondary to UTI. Treated with IV fluids, broad-spectrum antibiotics. Await urine and blood cultures.  UTI Urinalysis consistent with UTI, patient started on Rocephin. We'll adjust antibiotics according to the culture results. Cultures showing Proteus mirabilis so far.  Acute kidney injury Patient presented with creatinine of 1.23, baseline creatinine is 0.8. IV fluids discontinued and his creatinine is back to normal.  Hypokalemia Presented with potassium of 3.0, patient is on Lasix at home. Potassium today is 2.8. Replete with oral supplements.  Chronic respiratory failure Patient is on 2-3 L of oxygen at home chronically secondary to COPD.  Code Status: Full Code Family Communication: Plan discussed with the patient. Disposition Plan: Remains inpatient Diet: Diet Heart  Consultants:  None  Procedures:  None  Antibiotics:  Rocephin   Objective: Filed Vitals:   02/12/14 1430  BP: 150/76  Pulse: 83  Temp: 98.3 F (36.8 C)  Resp: 20    Intake/Output Summary (Last 24 hours) at 02/12/14 1552 Last data filed at 02/12/14 0800  Gross per 24 hour  Intake   1080 ml  Output      0 ml  Net   1080 ml   Filed Weights   02/10/14 2100  Weight: 67.132 kg (148 lb)    Exam: General: Alert and awake, oriented x3, not in any acute distress. HEENT: anicteric sclera,  pupils reactive to light and accommodation, EOMI CVS: S1-S2 clear, no murmur rubs or gallops Chest: clear to auscultation bilaterally, no wheezing, rales or rhonchi Abdomen: soft nontender, nondistended, normal bowel sounds, no organomegaly Extremities: no cyanosis, clubbing or edema noted bilaterally Neuro: Cranial nerves II-XII intact, no focal neurological deficits  Data Reviewed: Basic Metabolic Panel:  Recent Labs Lab 02/10/14 1842 02/11/14 0515  NA 132* 132*  K 3.0* 2.8*  CL 93* 97  CO2 32 25  GLUCOSE 161* 104*  BUN 23 17  CREATININE 1.23* 1.05  CALCIUM 9.3 8.2*   Liver Function Tests:  Recent Labs Lab 02/10/14 1842  AST 33  ALT 17  ALKPHOS 78  BILITOT 0.4  PROT 6.4  ALBUMIN 3.0*   No results for input(s): LIPASE, AMYLASE in the last 168 hours. No results for input(s): AMMONIA in the last 168 hours. CBC:  Recent Labs Lab 02/10/14 1842 02/11/14 0515  WBC 14.2* 10.5  NEUTROABS 12.5*  --   HGB 10.6* 9.3*  HCT 31.2* 27.8*  MCV 87.6 88.5  PLT 248 193   Cardiac Enzymes: No results for input(s): CKTOTAL, CKMB, CKMBINDEX, TROPONINI in the last 168 hours. BNP (last 3 results) No results for input(s): PROBNP in the last 8760 hours. CBG: No results for input(s): GLUCAP in the last 168 hours.  Micro Recent Results (from the past 240 hour(s))  Urine culture     Status: None (Preliminary result)   Collection Time: 02/10/14  6:44 PM  Result Value Ref Range Status   Specimen Description URINE, CLEAN  CATCH  Final   Special Requests NONE  Final   Colony Count   Final    >=100,000 COLONIES/ML Performed at [facility]    Culture   Final    PROTEUS MIRABILIS Performed at [facility]    Report Status PENDING  Incomplete  Blood culture (routine x 2)     Status: None (Preliminary result)   Collection Time: 02/10/14  7:05 PM  Result Value Ref Range Status   Specimen Description BLOOD RIGHT ARM  Final   Special Requests BOTTLES DRAWN AEROBIC  AND ANAEROBIC 10CC EA  Final   Culture   Final           BLOOD CULTURE RECEIVED NO GROWTH TO DATE CULTURE WILL BE HELD FOR 5 DAYS BEFORE ISSUING A FINAL NEGATIVE REPORT Performed at [facility]    Report Status PENDING  Incomplete  Blood culture (routine x 2)     Status: None (Preliminary result)   Collection Time: 02/10/14  7:15 PM  Result Value Ref Range Status   Specimen Description BLOOD RIGHT HAND  Final   Special Requests BOTTLES DRAWN AEROBIC ONLY 10CC  Final   Culture   Final           BLOOD CULTURE RECEIVED NO GROWTH TO DATE CULTURE WILL BE HELD FOR 5 DAYS BEFORE ISSUING A FINAL NEGATIVE REPORT Performed at [facility]    Report Status PENDING  Incomplete     Studies: Dg Chest 2 View  02/10/2014   CLINICAL DATA:  Fever, COPD  EXAM: CHEST  2 VIEW  COMPARISON:  03/30/2011  FINDINGS: Cardiomediastinal silhouette is stable. Mild hyperinflation. No acute infiltrate or pleural effusion. No pulmonary edema. Mild degenerative changes thoracic spine and bilateral shoulders.  IMPRESSION: No active disease. Mild hyperinflation. Degenerative changes thoracic spine and bilateral shoulders.   Electronically Signed   By: Lahoma Crocker M.D.   On: 02/10/2014 19:04    Scheduled Meds: . amLODipine  10 mg Oral Daily  . antiseptic oral rinse  7 mL Mouth Rinse BID  . ARIPiprazole  20 mg Oral QHS  . atorvastatin  10 mg Oral q1800  . buPROPion  300 mg Oral Daily  . busPIRone  15 mg Oral BID  . cefTRIAXone (ROCEPHIN)  IV  1 g Intravenous Q24H  . fluticasone  1 spray Each Nare BID  . heparin  5,000 Units Subcutaneous 3 times per day  . levothyroxine  100 mcg Oral QAC breakfast  . mometasone-formoterol  2 puff Inhalation BID  . oxcarbazepine  600 mg Oral BID  . tamsulosin  0.4 mg Oral Daily  . tiotropium  18 mcg Inhalation Daily  . traZODone  150 mg Oral QHS   Continuous Infusions:       Time spent: 35 minutes    [facility] A  [facility] Pager [phone] If  7PM-7AM, please contact night-coverage at [email or web address], password [facility] 02/12/2014, 3:52 PM  LOS: 2 days

## 2014-02-13 LAB — BASIC METABOLIC PANEL
Anion gap: 8 (ref 5–15)
BUN: 7 mg/dL (ref 6–23)
CO2: 25 mmol/L (ref 19–32)
Calcium: 8.7 mg/dL (ref 8.4–10.5)
Chloride: 104 mmol/L (ref 96–112)
Creatinine, Ser: 0.78 mg/dL (ref 0.50–1.10)
GFR calc Af Amer: >90 mL/min (ref >90)
GFR calc non Af Amer: 87 mL/min — ABNORMAL LOW (ref >90)
GLUCOSE: 110 mg/dL — AB (ref 70–99)
POTASSIUM: 3.8 mmol/L (ref 3.5–5.1)
Sodium: 137 mmol/L (ref 135–145)

## 2014-02-13 LAB — URINE CULTURE: Colony Count: >=100000

## 2014-02-13 LAB — GLUCOSE, CAPILLARY: Glucose-Capillary: 96 mg/dL (ref 70–99)

## 2014-02-13 MED ORDER — CEPHALEXIN 500 MG PO CAPS: 500.0000 mg | ORAL_CAPSULE | Freq: Three times a day (TID) | ORAL | Status: DC

## 2014-02-13 NOTE — Discharge Summary (Signed)
Physician Discharge Summary  DAILEE Patton OEH:[id number] DOB: 11-19-1950 DOA: 02/10/2014  PCP: Martha Pardon, MD  Admit date: 02/10/2014 Discharge date: 02/13/2014  Time spent: 40 minutes  Recommendations for Outpatient Follow-up:  1. Follow-up with primary care physician within one week.  Discharge Diagnoses:  Principal Problem:   Sepsis secondary to UTI Active Problems:   AKI (acute kidney injury)   Chronic respiratory failure   Hypokalemia   UTI (lower urinary tract infection)   Discharge Condition: Stable  Diet recommendation: Heart healthy  Filed Weights   02/10/14 2100  Weight: 67.132 kg (148 lb)    History of present illness:  Martha Patton is a 64 y.o. female who presents to the ED with c/o fever, generalized weakness, urinary frequency. Symptoms have been ongoing for the past day or so. Last time she had symptoms they developed into UTI with systemic sepsis, AKI, and were associated with a [facility] stay at [facility] last summer. As a result she didn't wait this time and presented to the ED.  Patton Course:   Sepsis Patient met sepsis criteria with respiratory rate of 28, heart rate of 114 and WBCs of 14.2 and presence of infection. Sepsis likely secondary to UTI. Treated with IV fluids, broad-spectrum antibiotics. Blood culture showed Proteus.  Proteus mirabilis UTI Urinalysis consistent with UTI, patient started on Rocephin. We'll adjust antibiotics according to the culture results. Blood culture showed pansensitive Proteus mirabilis, patient discharged home on cephalexin for 5 more days.  Acute kidney injury Patient presented with creatinine of 1.23, baseline creatinine is 0.8. IV fluids discontinued and his creatinine is back to normal.  Hypokalemia Presented with potassium of 3.0, patient is on Lasix at home. Potassium today is 2.8. Replete with oral supplements.  Chronic respiratory failure Patient is on 2-3 L of oxygen at home chronically secondary to  COPD.   Procedures:  None  Consultations:  None  Discharge Exam: Filed Vitals:   02/13/14 0515  BP: 170/91  Pulse: 90  Temp: 98.4 F (36.9 C)  Resp: 20   General: Alert and awake, oriented x3, not in any acute distress. HEENT: anicteric sclera, pupils reactive to light and accommodation, EOMI CVS: S1-S2 clear, no murmur rubs or gallops Chest: clear to auscultation bilaterally, no wheezing, rales or rhonchi Abdomen: soft nontender, nondistended, normal bowel sounds, no organomegaly Extremities: no cyanosis, clubbing or edema noted bilaterally Neuro: Cranial nerves II-XII intact, no focal neurological deficits   Discharge Instructions   Discharge Instructions    Diet - low sodium heart healthy    Complete by:  As directed      Increase activity slowly    Complete by:  As directed           Current Discharge Medication List    START taking these medications   Details  cephALEXin (KEFLEX) 500 MG capsule Take 1 capsule (500 mg total) by mouth 3 (three) times daily. Qty: 15 capsule, Refills: 0      CONTINUE these medications which have NOT CHANGED   Details  Aclidinium Bromide (TUDORZA PRESSAIR) 400 MCG/ACT AEPB Inhale 400 mcg into the lungs every 12 (twelve) hours.    albuterol (PROVENTIL,VENTOLIN) 90 MCG/ACT inhaler Inhale 2 puffs into the lungs every 6 (six) hours as needed. For shortness of breath    amLODipine-benazepril (LOTREL) 10-20 MG per capsule TAKE 1 CAPSULE BY MOUTH DAILY. Qty: 90 capsule, Refills: 1    ARIPiprazole (ABILIFY) 20 MG tablet Take 20 mg by mouth at bedtime.  atorvastatin (LIPITOR) 10 MG tablet TAKE 1 TABLET (10 MG TOTAL) BY MOUTH DAILY. Qty: 90 tablet, Refills: 1    benazepril (LOTENSIN) 20 MG tablet Take 1 tablet (20 mg total) by mouth daily. Qty: 90 tablet, Refills: 1    buPROPion (WELLBUTRIN XL) 300 MG 24 hr tablet Take 300 mg by mouth daily.     busPIRone (BUSPAR) 15 MG tablet Take 15 mg by mouth 2 (two) times daily.       clonazePAM (KLONOPIN) 0.5 MG tablet Take 0.5 mg by mouth 2 (two) times daily as needed. For nerves    fluticasone (FLONASE) 50 MCG/ACT nasal spray 1 spray by Nasal route 2 (two) times daily.      Fluticasone-Salmeterol (ADVAIR DISKUS) 250-50 MCG/DOSE AEPB Inhale 2 puffs into the lungs 2 (two) times daily.     furosemide (LASIX) 40 MG tablet Take 1 tablet (40 mg total) by mouth daily. Qty: 90 tablet, Refills: 3    ibuprofen (ADVIL,MOTRIN) 200 MG tablet Take 200-400 mg by mouth every 6 (six) hours as needed. For pain    levothyroxine (SYNTHROID, LEVOTHROID) 100 MCG tablet TAKE 1 TABLET (100 MCG TOTAL) BY MOUTH DAILY. Qty: 90 tablet, Refills: 1    Melatonin 3 MG TABS Takes 3-10 mg per night    Multiple Vitamins-Minerals (CENTRUM SILVER PO) Take 1 tablet by mouth daily.     oxcarbazepine (TRILEPTAL) 600 MG tablet Take 600 mg by mouth 2 (two) times daily.     potassium chloride (KLOR-CON 10) 10 MEQ tablet Take 1 tablet (10 mEq total) by mouth 3 (three) times daily. Qty: 270 tablet, Refills: 3    Tamsulosin HCl (FLOMAX) 0.4 MG CAPS Take 0.4 mg by mouth daily.     traZODone (DESYREL) 100 MG tablet Take 150 mg by mouth at bedtime.        Allergies  Allergen Reactions  . Ezetimibe     REACTION: elevated lfts  . Fluvastatin Sodium     REACTION: elevated LFT's  . Naproxen     REACTION: GI   Follow-up Information    Follow up with Martha Pardon, MD In 1 week.   Specialties:  Family Medicine, Radiology   Contact information:   [address] [place] [address] [phone]        The results of significant diagnostics from this hospitalization (including imaging, microbiology, ancillary and laboratory) are listed below for reference.    Significant Diagnostic Studies: Dg Chest 2 View  02/10/2014   CLINICAL DATA:  Fever, COPD  EXAM: CHEST  2 VIEW  COMPARISON:  03/30/2011  FINDINGS: Cardiomediastinal silhouette is stable. Mild hyperinflation.  No acute infiltrate or pleural effusion. No pulmonary edema. Mild degenerative changes thoracic spine and bilateral shoulders.  IMPRESSION: No active disease. Mild hyperinflation. Degenerative changes thoracic spine and bilateral shoulders.   Electronically Signed   By: Lahoma Crocker M.D.   On: 02/10/2014 19:04    Microbiology: Recent Results (from the past 240 hour(s))  Urine culture     Status: None   Collection Time: 02/10/14  6:44 PM  Result Value Ref Range Status   Specimen Description URINE, CLEAN CATCH  Final   Special Requests NONE  Final   Colony Count   Final    >=100,000 COLONIES/ML Performed at [place] Performed at [facility]    Report Status 02/13/2014 FINAL  Final   Organism ID,  Bacteria PROTEUS MIRABILIS  Final      Susceptibility   Proteus mirabilis - MIC*    AMPICILLIN <=2 SENSITIVE Sensitive     CEFAZOLIN <=4 SENSITIVE Sensitive     CEFTRIAXONE <=1 SENSITIVE Sensitive     CIPROFLOXACIN <=0.25 SENSITIVE Sensitive     GENTAMICIN <=1 SENSITIVE Sensitive     LEVOFLOXACIN <=0.12 SENSITIVE Sensitive     NITROFURANTOIN 128 RESISTANT Resistant     TOBRAMYCIN <=1 SENSITIVE Sensitive     TRIMETH/SULFA <=20 SENSITIVE Sensitive     PIP/TAZO <=4 SENSITIVE Sensitive     * PROTEUS MIRABILIS  Blood culture (routine x 2)     Status: None (Preliminary result)   Collection Time: 02/10/14  7:05 PM  Result Value Ref Range Status   Specimen Description BLOOD RIGHT ARM  Final   Special Requests BOTTLES DRAWN AEROBIC AND ANAEROBIC 10CC EA  Final   Culture   Final           BLOOD CULTURE RECEIVED NO GROWTH TO DATE CULTURE WILL BE HELD FOR 5 DAYS BEFORE ISSUING A FINAL NEGATIVE REPORT Performed at [facility]    Report Status PENDING  Incomplete  Blood culture (routine x 2)     Status: None (Preliminary result)   Collection Time: 02/10/14  7:15 PM  Result Value Ref Range Status   Specimen Description BLOOD  RIGHT HAND  Final   Special Requests BOTTLES DRAWN AEROBIC ONLY 10CC  Final   Culture   Final           BLOOD CULTURE RECEIVED NO GROWTH TO DATE CULTURE WILL BE HELD FOR 5 DAYS BEFORE ISSUING A FINAL NEGATIVE REPORT Performed at [facility]    Report Status PENDING  Incomplete     Labs: Basic Metabolic Panel:  Recent Labs Lab 02/10/14 1842 02/11/14 0515 02/12/14 1657 02/13/14 0456  NA 132* 132* 136 137  K 3.0* 2.8* 3.6 3.8  CL 93* 97 102 104  CO2 32 $Remo'25 27 25  'jhikD$ GLUCOSE 161* 104* 120* 110*  BUN $Re'23 17 10 7  'OOA$ CREATININE 1.23* 1.05 0.92 0.78  CALCIUM 9.3 8.2* 8.5 8.7   Liver Function Tests:  Recent Labs Lab 02/10/14 1842  AST 33  ALT 17  ALKPHOS 78  BILITOT 0.4  PROT 6.4  ALBUMIN 3.0*   No results for input(s): LIPASE, AMYLASE in the last 168 hours. No results for input(s): AMMONIA in the last 168 hours. CBC:  Recent Labs Lab 02/10/14 1842 02/11/14 0515  WBC 14.2* 10.5  NEUTROABS 12.5*  --   HGB 10.6* 9.3*  HCT 31.2* 27.8*  MCV 87.6 88.5  PLT 248 193   Cardiac Enzymes: No results for input(s): CKTOTAL, CKMB, CKMBINDEX, TROPONINI in the last 168 hours. BNP: BNP (last 3 results) No results for input(s): PROBNP in the last 8760 hours. CBG:  Recent Labs Lab 02/12/14 2144 02/13/14 0633  GLUCAP 103* 96       Signed:  Drue Camera A  [facility] 02/13/2014, 10:33 AM

## 2014-02-17 LAB — CULTURE, BLOOD (ROUTINE X 2)
CULTURE: NO GROWTH
Culture: NO GROWTH

## 2014-02-21 ENCOUNTER — Encounter: Payer: Self-pay | Admitting: Family Medicine

## 2014-02-21 ENCOUNTER — Ambulatory Visit (INDEPENDENT_AMBULATORY_CARE_PROVIDER_SITE_OTHER): Payer: Medicare Other | Admitting: Family Medicine

## 2014-02-21 VITALS — BP 126/72 | HR 88 | Temp 98.8°F | Ht 60.5 in | Wt 151.8 lb

## 2014-02-21 DIAGNOSIS — N390 Urinary tract infection, site not specified: Secondary | ICD-10-CM

## 2014-02-21 LAB — POCT URINALYSIS DIPSTICK
Bilirubin, UA: NEGATIVE
Blood, UA: 10
Glucose, UA: NEGATIVE
KETONES UA: NEGATIVE
Leukocytes, UA: NEGATIVE
NITRITE UA: NEGATIVE
Protein, UA: NEGATIVE
SPEC GRAV UA: 1.015
UROBILINOGEN UA: 0.2
pH, UA: 6

## 2014-02-21 NOTE — Progress Notes (Signed)
Subjective:    Patient ID: Martha Patton, female    DOB: 03/04/1950, 64 y.o.   MRN: [id number]  HPI Here for hosp 1/23 to 1/26  Had uti with sepsis -not as bad as previous  Wbc spiked to 14.2 -nl at d/c  Cr 1.23 - nl by the time she went home  K got low 2.8 - nl at discharge   Proteus in urine  Rocephin - then d/c to cephalexin     Chemistry      Component Value Date/Time   NA 137 02/13/2014 0456   K 3.8 02/13/2014 0456   CL 104 02/13/2014 0456   CO2 25 02/13/2014 0456   BUN 7 02/13/2014 0456   CREATININE 0.78 02/13/2014 0456   CREATININE 0.85 05/23/2010 1713      Component Value Date/Time   CALCIUM 8.7 02/13/2014 0456   ALKPHOS 78 02/10/2014 1842   AST 33 02/10/2014 1842   ALT 17 02/10/2014 1842   BILITOT 0.4 02/10/2014 1842      Blood cultures were normal   Lab Results  Component Value Date   WBC 10.5 02/11/2014   HGB 9.3* 02/11/2014   HCT 27.8* 02/11/2014   MCV 88.5 02/11/2014   PLT 193 02/11/2014    Overall she is feeling fine  Back is still bothering her some - bilateral No urinary symptoms Is still quite tired  Is drinking water   Patient Active Problem List   Diagnosis Date Noted  . Chronic respiratory failure 02/11/2014  . Hypokalemia 02/11/2014  . UTI (lower urinary tract infection) 02/11/2014  . Sepsis secondary to UTI 02/10/2014  . AKI (acute kidney injury) 02/10/2014  . Occult blood positive stool 12/18/2013  . Incisional hernia, without obstruction or gangrene 12/15/2013  . E-coli UTI 08/30/2013  . History of renal failure 08/30/2013  . Encounter for Medicare annual wellness exam 12/05/2012  . Right knee pain 05/11/2012  . Left shoulder pain 05/11/2012  . Right shoulder pain 05/11/2012  . Colon cancer screening 12/01/2011  . HTN (hypertension) 03/30/2011  . Obstructive sleep apnea on CPAP   . Localized swelling, mass and lump, neck 09/01/2010  . Osteopenia 04/29/2009  . LIVER FUNCTION TESTS, ABNORMAL, HX OF 02/13/2009  . RHINITIS  12/12/2008  . HYPOKALEMIA 05/01/2008  . Smoker 02/16/2007  . CARCINOMA, KIDNEY 04/28/2006  . Hypothyroidism 04/28/2006  . HASHIMOTO'S THYROIDITIS 04/28/2006  . HYPERCHOLESTEROLEMIA 04/28/2006  . BIPOLAR AFFECTIVE DISORDER 04/28/2006  . ABUSIVE PERSONALITY 04/28/2006  . DEAFNESS 04/28/2006  . COPD 04/28/2006  . OSTEOARTHRITIS 04/28/2006  . [place] DISEASE 04/28/2006  . SLEEP APNEA 04/28/2006   Past Medical History  Diagnosis Date  . COPD (chronic obstructive pulmonary disease)   . Hypertension   . Thyroid disease   . Osteoarthritis   . Bipolar 1 disorder   . Cancer of kidney   . Urinary retention   . Deafness     cochlear implants bilat  . Hyperlipidemia   . Allergy   . Macular degeneration     left eye  . Osteopenia   . Obstructive sleep apnea on CPAP   . E. coli UTI (urinary tract infection)   . Sepsis   . Renal failure    Past Surgical History  Procedure Laterality Date  . Appendectomy    . Cholecystectomy    . Abdominal hysterectomy    . Bladder suspension      bladder tack  . Nephrectomy      left  . Rotator cuff  repair    . Tonsillectomy    . External ear surgery      x3  . Tibia fracture surgery      as a child  . Liver biopsy  02/1998    cysts  . Leep  2002  . Septoplasty  06/2003  . Cervical disc surgery     History  Substance Use Topics  . Smoking status: Current Some Day Smoker -- 0.50 packs/day for 40 years    Types: Cigarettes  . Smokeless tobacco: Not on file     Comment: has quit in the past on and off   . Alcohol Use: 0.0 oz/week    0 Not specified per week     Comment: rare   No family history on file. Allergies  Allergen Reactions  . Ezetimibe     REACTION: elevated lfts  . Fluvastatin Sodium     REACTION: elevated LFT's  . Naproxen     REACTION: GI   Current Outpatient Prescriptions on File Prior to Visit  Medication Sig Dispense Refill  . Aclidinium Bromide (TUDORZA PRESSAIR) 400 MCG/ACT AEPB Inhale 400 mcg  into the lungs every 12 (twelve) hours.    Marland Kitchen albuterol (PROVENTIL,VENTOLIN) 90 MCG/ACT inhaler Inhale 2 puffs into the lungs every 6 (six) hours as needed. For shortness of breath    . amLODipine-benazepril (LOTREL) 10-20 MG per capsule TAKE 1 CAPSULE BY MOUTH DAILY. 90 capsule 1  . ARIPiprazole (ABILIFY) 20 MG tablet Take 20 mg by mouth at bedtime.    Marland Kitchen atorvastatin (LIPITOR) 10 MG tablet TAKE 1 TABLET (10 MG TOTAL) BY MOUTH DAILY. 90 tablet 1  . benazepril (LOTENSIN) 20 MG tablet Take 1 tablet (20 mg total) by mouth daily. 90 tablet 1  . buPROPion (WELLBUTRIN XL) 300 MG 24 hr tablet Take 300 mg by mouth daily.     . busPIRone (BUSPAR) 15 MG tablet Take 15 mg by mouth 2 (two) times daily.      . cephALEXin (KEFLEX) 500 MG capsule Take 1 capsule (500 mg total) by mouth 3 (three) times daily. 15 capsule 0  . clonazePAM (KLONOPIN) 0.5 MG tablet Take 0.5 mg by mouth 2 (two) times daily as needed. For nerves    . fluticasone (FLONASE) 50 MCG/ACT nasal spray 1 spray by Nasal route 2 (two) times daily.      . Fluticasone-Salmeterol (ADVAIR DISKUS) 250-50 MCG/DOSE AEPB Inhale 2 puffs into the lungs 2 (two) times daily.     . furosemide (LASIX) 40 MG tablet Take 1 tablet (40 mg total) by mouth daily. 90 tablet 3  . ibuprofen (ADVIL,MOTRIN) 200 MG tablet Take 200-400 mg by mouth every 6 (six) hours as needed. For pain    . levothyroxine (SYNTHROID, LEVOTHROID) 100 MCG tablet TAKE 1 TABLET (100 MCG TOTAL) BY MOUTH DAILY. 90 tablet 1  . Melatonin 3 MG TABS Takes 3-10 mg per night    . Multiple Vitamins-Minerals (CENTRUM SILVER PO) Take 1 tablet by mouth daily.     Marland Kitchen oxcarbazepine (TRILEPTAL) 600 MG tablet Take 600 mg by mouth 2 (two) times daily.     . potassium chloride (KLOR-CON 10) 10 MEQ tablet Take 1 tablet (10 mEq total) by mouth 3 (three) times daily. 270 tablet 3  . Tamsulosin HCl (FLOMAX) 0.4 MG CAPS Take 0.4 mg by mouth daily.     . traZODone (DESYREL) 100 MG tablet Take 150 mg by mouth at  bedtime.     . [DISCONTINUED] Calcium-Vitamin D (CALTRATE 600  PLUS-VIT D PO) Take by mouth daily.       No current facility-administered medications on file prior to visit.         Review of Systems Review of Systems  Constitutional: Negative for fever, appetite change, and unexpected weight change. pos for fatigue and some generalized weakness -that is improving  Eyes: Negative for pain and visual disturbance.  Respiratory: Negative for cough and shortness of breath.   Cardiovascular: Negative for cp or palpitations    Gastrointestinal: Negative for nausea, diarrhea and constipation.  Genitourinary: Negative for urgency and frequency. neg for dysuria and hematuria  Skin: Negative for pallor or rash   Neurological: Negative for weakness, light-headedness, numbness and headaches.  Hematological: Negative for adenopathy. Does not bruise/bleed easily.  Psychiatric/Behavioral: Negative for dysphoric mood. The patient is not nervous/anxious.         Objective:   Physical Exam  Constitutional: She appears well-developed and well-nourished. No distress.  overwt and frail appearing   HENT:  Head: Normocephalic and atraumatic.  Mouth/Throat: Oropharynx is clear and moist.  HOH  Eyes: Conjunctivae and EOM are normal. Pupils are equal, round, and reactive to light. No scleral icterus.  Neck: Normal range of motion. Neck supple. No JVD present. Carotid bruit is not present. No thyromegaly present.  Cardiovascular: Normal rate, regular rhythm, normal heart sounds and intact distal pulses.  Exam reveals no gallop.   Pulmonary/Chest: Effort normal and breath sounds normal. No respiratory distress. She has no wheezes. She exhibits no tenderness.  Abdominal: Soft. Bowel sounds are normal. She exhibits no distension, no abdominal bruit and no mass. There is no tenderness. There is no rebound and no guarding.  No suprapubic tenderness or fullness   No cva tenderness   Musculoskeletal: Normal  range of motion. She exhibits no edema or tenderness.  Lymphadenopathy:    She has no cervical adenopathy.  Neurological: She is alert. She has normal reflexes. No cranial nerve deficit. She exhibits normal muscle tone. Coordination normal.  Skin: Skin is warm and dry. No rash noted. No erythema. No pallor.  Psychiatric: She has a normal mood and affect.          Assessment & Plan:   Problem List Items Addressed This Visit      Genitourinary   UTI (lower urinary tract infection) - Primary    S/p hospitalization- labeled as sepsis clinically but blood cx came back neg  Rev hosp records and studies in detail with pt and family today Proteus uti tx with cephalosporin  Clinically resolved-pt is getting her energy back  Disc ways to avoid uti Needs to inc fluids  Can cut her lasix dose if edema is not too bad ua is clear today Will update if symptoms return      Relevant Orders   POCT urinalysis dipstick (Completed)

## 2014-02-21 NOTE — Progress Notes (Signed)
Pre visit review using our clinic review tool, if applicable. No additional management support is needed unless otherwise documented below in the visit note. 

## 2014-02-21 NOTE — Patient Instructions (Signed)
I'm glad you are feeling better  Your exam is re assuring  Please leave a urine specimen on the way out  Your labs were back to normal when you were discharged from the hospital   Keep me posted if symptoms return  See Dr Vella Kohler as planned

## 2014-02-22 ENCOUNTER — Encounter: Payer: Self-pay | Admitting: Family Medicine

## 2014-02-22 NOTE — Assessment & Plan Note (Signed)
S/p hospitalization- labeled as sepsis clinically but blood cx came back neg  Rev hosp records and studies in detail with pt and family today Proteus uti tx with cephalosporin  Clinically resolved-pt is getting her energy back  Disc ways to avoid uti Needs to inc fluids  Can cut her lasix dose if edema is not too bad ua is clear today Will update if symptoms return

## 2014-03-08 ENCOUNTER — Emergency Department (HOSPITAL_COMMUNITY): Payer: Medicare Other

## 2014-03-08 ENCOUNTER — Inpatient Hospital Stay (HOSPITAL_COMMUNITY)
Admission: EM | Admit: 2014-03-08 | Discharge: 2014-03-12 | DRG: 871 | Disposition: A | Discharge status: Home Health | Payer: Medicare Other | Attending: Internal Medicine | Admitting: Internal Medicine

## 2014-03-08 ENCOUNTER — Encounter (HOSPITAL_COMMUNITY): Payer: Self-pay | Admitting: Emergency Medicine

## 2014-03-08 DIAGNOSIS — G4733 Obstructive sleep apnea (adult) (pediatric): Secondary | ICD-10-CM | POA: Diagnosis present

## 2014-03-08 DIAGNOSIS — J441 Chronic obstructive pulmonary disease with (acute) exacerbation: Secondary | ICD-10-CM | POA: Diagnosis present

## 2014-03-08 DIAGNOSIS — Z888 Allergy status to other drugs, medicaments and biological substances status: Secondary | ICD-10-CM | POA: Diagnosis not present

## 2014-03-08 DIAGNOSIS — Z79899 Other long term (current) drug therapy: Secondary | ICD-10-CM

## 2014-03-08 DIAGNOSIS — F319 Bipolar disorder, unspecified: Secondary | ICD-10-CM | POA: Diagnosis present

## 2014-03-08 DIAGNOSIS — J9621 Acute and chronic respiratory failure with hypoxia: Secondary | ICD-10-CM | POA: Diagnosis present

## 2014-03-08 DIAGNOSIS — J449 Chronic obstructive pulmonary disease, unspecified: Secondary | ICD-10-CM | POA: Diagnosis present

## 2014-03-08 DIAGNOSIS — Z515 Encounter for palliative care: Secondary | ICD-10-CM

## 2014-03-08 DIAGNOSIS — E860 Dehydration: Secondary | ICD-10-CM | POA: Diagnosis present

## 2014-03-08 DIAGNOSIS — N179 Acute kidney failure, unspecified: Secondary | ICD-10-CM | POA: Diagnosis present

## 2014-03-08 DIAGNOSIS — N390 Urinary tract infection, site not specified: Secondary | ICD-10-CM | POA: Diagnosis present

## 2014-03-08 DIAGNOSIS — Z9109 Other allergy status, other than to drugs and biological substances: Secondary | ICD-10-CM | POA: Diagnosis not present

## 2014-03-08 DIAGNOSIS — A4151 Sepsis due to Escherichia coli [E. coli]: Secondary | ICD-10-CM | POA: Diagnosis not present

## 2014-03-08 DIAGNOSIS — D649 Anemia, unspecified: Secondary | ICD-10-CM | POA: Diagnosis present

## 2014-03-08 DIAGNOSIS — Z66 Do not resuscitate: Secondary | ICD-10-CM | POA: Diagnosis present

## 2014-03-08 DIAGNOSIS — F17210 Nicotine dependence, cigarettes, uncomplicated: Secondary | ICD-10-CM | POA: Diagnosis present

## 2014-03-08 DIAGNOSIS — J189 Pneumonia, unspecified organism: Secondary | ICD-10-CM | POA: Diagnosis present

## 2014-03-08 DIAGNOSIS — E785 Hyperlipidemia, unspecified: Secondary | ICD-10-CM | POA: Diagnosis present

## 2014-03-08 DIAGNOSIS — Z791 Long term (current) use of non-steroidal anti-inflammatories (NSAID): Secondary | ICD-10-CM

## 2014-03-08 DIAGNOSIS — E039 Hypothyroidism, unspecified: Secondary | ICD-10-CM | POA: Diagnosis present

## 2014-03-08 DIAGNOSIS — A419 Sepsis, unspecified organism: Secondary | ICD-10-CM | POA: Diagnosis present

## 2014-03-08 DIAGNOSIS — R4182 Altered mental status, unspecified: Secondary | ICD-10-CM | POA: Diagnosis not present

## 2014-03-08 DIAGNOSIS — Z87440 Personal history of urinary (tract) infections: Secondary | ICD-10-CM | POA: Diagnosis not present

## 2014-03-08 DIAGNOSIS — M1990 Unspecified osteoarthritis, unspecified site: Secondary | ICD-10-CM | POA: Diagnosis present

## 2014-03-08 DIAGNOSIS — R0602 Shortness of breath: Secondary | ICD-10-CM

## 2014-03-08 DIAGNOSIS — Y95 Nosocomial condition: Secondary | ICD-10-CM | POA: Diagnosis present

## 2014-03-08 DIAGNOSIS — H9190 Unspecified hearing loss, unspecified ear: Secondary | ICD-10-CM | POA: Diagnosis present

## 2014-03-08 DIAGNOSIS — F0390 Unspecified dementia without behavioral disturbance: Secondary | ICD-10-CM | POA: Diagnosis present

## 2014-03-08 DIAGNOSIS — N19 Unspecified kidney failure: Secondary | ICD-10-CM

## 2014-03-08 DIAGNOSIS — R6520 Severe sepsis without septic shock: Secondary | ICD-10-CM | POA: Diagnosis present

## 2014-03-08 DIAGNOSIS — G9340 Encephalopathy, unspecified: Secondary | ICD-10-CM | POA: Diagnosis present

## 2014-03-08 DIAGNOSIS — I10 Essential (primary) hypertension: Secondary | ICD-10-CM | POA: Diagnosis present

## 2014-03-08 DIAGNOSIS — R531 Weakness: Secondary | ICD-10-CM

## 2014-03-08 DIAGNOSIS — IMO0001 Reserved for inherently not codable concepts without codable children: Secondary | ICD-10-CM | POA: Insufficient documentation

## 2014-03-08 LAB — COMPREHENSIVE METABOLIC PANEL
ALT: 15 U/L (ref 0–35)
AST: 29 U/L (ref 0–37)
Albumin: 2.9 g/dL — ABNORMAL LOW (ref 3.5–5.2)
Alkaline Phosphatase: 76 U/L (ref 39–117)
Anion gap: 11 (ref 5–15)
BUN: 36 mg/dL — ABNORMAL HIGH (ref 6–23)
CO2: 24 mmol/L (ref 19–32)
Calcium: 8.7 mg/dL (ref 8.4–10.5)
Chloride: 98 mmol/L (ref 96–112)
Creatinine, Ser: 3.07 mg/dL — ABNORMAL HIGH (ref 0.50–1.10)
GFR calc Af Amer: 17 mL/min — ABNORMAL LOW (ref >90)
GFR, EST NON AFRICAN AMERICAN: 15 mL/min — AB (ref >90)
Glucose, Bld: 127 mg/dL — ABNORMAL HIGH (ref 70–99)
Potassium: 4.4 mmol/L (ref 3.5–5.1)
SODIUM: 133 mmol/L — AB (ref 135–145)
TOTAL PROTEIN: 6.4 g/dL (ref 6.0–8.3)
Total Bilirubin: 0.6 mg/dL (ref 0.3–1.2)

## 2014-03-08 LAB — CBC WITH DIFFERENTIAL/PLATELET
BASOS PCT: 0 % (ref 0–1)
Basophils Absolute: 0 10*3/uL (ref 0.0–0.1)
Eosinophils Absolute: 0.1 10*3/uL (ref 0.0–0.7)
Eosinophils Relative: 0 % (ref 0–5)
HEMATOCRIT: 28.9 % — AB (ref 36.0–46.0)
Hemoglobin: 9.9 g/dL — ABNORMAL LOW (ref 12.0–15.0)
Lymphocytes Relative: 5 % — ABNORMAL LOW (ref 12–46)
Lymphs Abs: 0.6 10*3/uL — ABNORMAL LOW (ref 0.7–4.0)
MCH: 29.8 pg (ref 26.0–34.0)
MCHC: 34.3 g/dL (ref 30.0–36.0)
MCV: 87 fL (ref 78.0–100.0)
MONOS PCT: 5 % (ref 3–12)
Monocytes Absolute: 0.6 10*3/uL (ref 0.1–1.0)
Neutro Abs: 10.5 10*3/uL — ABNORMAL HIGH (ref 1.7–7.7)
Neutrophils Relative %: 90 % — ABNORMAL HIGH (ref 43–77)
Platelets: 359 10*3/uL (ref 150–400)
RBC: 3.32 MIL/uL — ABNORMAL LOW (ref 3.87–5.11)
RDW: 14 % (ref 11.5–15.5)
WBC: 11.7 10*3/uL — ABNORMAL HIGH (ref 4.0–10.5)

## 2014-03-08 LAB — URINE MICROSCOPIC-ADD ON

## 2014-03-08 LAB — URINALYSIS, ROUTINE W REFLEX MICROSCOPIC
Bilirubin Urine: NEGATIVE
Glucose, UA: NEGATIVE mg/dL
Ketones, ur: NEGATIVE mg/dL
Nitrite: POSITIVE — AB
PROTEIN: 30 mg/dL — AB
SPECIFIC GRAVITY, URINE: 1.012 (ref 1.005–1.030)
UROBILINOGEN UA: 0.2 mg/dL (ref 0.0–1.0)
pH: 6 (ref 5.0–8.0)

## 2014-03-08 LAB — LACTIC ACID, PLASMA: Lactic Acid, Venous: 1 mmol/L (ref 0.5–2.0)

## 2014-03-08 LAB — I-STAT CG4 LACTIC ACID, ED
Lactic Acid, Venous: 0.74 mmol/L (ref 0.5–2.0)
Lactic Acid, Venous: 1.28 mmol/L (ref 0.5–2.0)

## 2014-03-08 LAB — STREP PNEUMONIAE URINARY ANTIGEN: STREP PNEUMO URINARY ANTIGEN: NEGATIVE

## 2014-03-08 LAB — BRAIN NATRIURETIC PEPTIDE: B Natriuretic Peptide: 67.6 pg/mL (ref 0.0–100.0)

## 2014-03-08 LAB — MRSA PCR SCREENING: MRSA by PCR: NEGATIVE

## 2014-03-08 MED ORDER — HEPARIN SODIUM (PORCINE) 5000 UNIT/ML IJ SOLN
5000.0000 [IU] | Freq: Three times a day (TID) | INTRAMUSCULAR | Status: DC
  Administered 2014-03-08 – 2014-03-12 (×13): 5000 [IU] via SUBCUTANEOUS
  Filled 2014-03-08 (×15): qty 1

## 2014-03-08 MED ORDER — VANCOMYCIN HCL IN DEXTROSE 1-5 GM/200ML-% IV SOLN
1000.0000 mg | Freq: Once | INTRAVENOUS | Status: AC
  Administered 2014-03-08: 1000 mg via INTRAVENOUS
  Filled 2014-03-08: qty 200

## 2014-03-08 MED ORDER — BUSPIRONE HCL 15 MG PO TABS
15.0000 mg | ORAL_TABLET | Freq: Two times a day (BID) | ORAL | Status: DC
  Administered 2014-03-08 – 2014-03-12 (×9): 15 mg via ORAL
  Filled 2014-03-08 (×5): qty 1
  Filled 2014-03-08: qty 2
  Filled 2014-03-08 (×4): qty 1

## 2014-03-08 MED ORDER — OXCARBAZEPINE 300 MG PO TABS
600.0000 mg | ORAL_TABLET | Freq: Two times a day (BID) | ORAL | Status: DC
  Administered 2014-03-08 – 2014-03-12 (×9): 600 mg via ORAL
  Filled 2014-03-08 (×14): qty 2

## 2014-03-08 MED ORDER — SODIUM CHLORIDE 0.9 % IV BOLUS (SEPSIS)
500.0000 mL | INTRAVENOUS | Status: AC
  Administered 2014-03-08: 500 mL via INTRAVENOUS

## 2014-03-08 MED ORDER — CETYLPYRIDINIUM CHLORIDE 0.05 % MT LIQD
7.0000 mL | Freq: Two times a day (BID) | OROMUCOSAL | Status: DC
  Administered 2014-03-08: 7 mL via OROMUCOSAL

## 2014-03-08 MED ORDER — LEVOTHYROXINE SODIUM 100 MCG IV SOLR
50.0000 ug | Freq: Every day | INTRAVENOUS | Status: DC
  Administered 2014-03-08: 50 ug via INTRAVENOUS
  Filled 2014-03-08 (×2): qty 5

## 2014-03-08 MED ORDER — CLONAZEPAM 0.5 MG PO TABS
0.5000 mg | ORAL_TABLET | Freq: Two times a day (BID) | ORAL | Status: DC | PRN
  Administered 2014-03-08 – 2014-03-09 (×2): 0.5 mg via ORAL
  Filled 2014-03-08 (×2): qty 1

## 2014-03-08 MED ORDER — ALBUTEROL SULFATE (2.5 MG/3ML) 0.083% IN NEBU: 3.0000 mL | INHALATION_SOLUTION | Freq: Four times a day (QID) | RESPIRATORY_TRACT | Status: DC | PRN

## 2014-03-08 MED ORDER — TAMSULOSIN HCL 0.4 MG PO CAPS
0.4000 mg | ORAL_CAPSULE | Freq: Every day | ORAL | Status: DC
  Administered 2014-03-08 – 2014-03-12 (×5): 0.4 mg via ORAL
  Filled 2014-03-08 (×5): qty 1

## 2014-03-08 MED ORDER — VANCOMYCIN HCL IN DEXTROSE 1-5 GM/200ML-% IV SOLN
1000.0000 mg | INTRAVENOUS | Status: DC
  Administered 2014-03-10: 1000 mg via INTRAVENOUS
  Filled 2014-03-08: qty 200

## 2014-03-08 MED ORDER — ACETAMINOPHEN 10 MG/ML IV SOLN
1000.0000 mg | Freq: Once | INTRAVENOUS | Status: AC
  Administered 2014-03-08: 1000 mg via INTRAVENOUS
  Filled 2014-03-08: qty 100

## 2014-03-08 MED ORDER — DEXTROSE 5 % IV SOLN
1.0000 g | Freq: Once | INTRAVENOUS | Status: AC
  Administered 2014-03-08: 1 g via INTRAVENOUS
  Filled 2014-03-08: qty 1

## 2014-03-08 MED ORDER — IPRATROPIUM-ALBUTEROL 0.5-2.5 (3) MG/3ML IN SOLN
3.0000 mL | Freq: Four times a day (QID) | RESPIRATORY_TRACT | Status: DC
  Administered 2014-03-08 – 2014-03-09 (×5): 3 mL via RESPIRATORY_TRACT
  Filled 2014-03-08 (×4): qty 3

## 2014-03-08 MED ORDER — TRAZODONE HCL 150 MG PO TABS
150.0000 mg | ORAL_TABLET | Freq: Every day | ORAL | Status: DC
  Administered 2014-03-08 – 2014-03-11 (×4): 150 mg via ORAL
  Filled 2014-03-08 (×5): qty 1

## 2014-03-08 MED ORDER — ACETAMINOPHEN 325 MG PO TABS
650.0000 mg | ORAL_TABLET | Freq: Four times a day (QID) | ORAL | Status: DC | PRN
  Administered 2014-03-09 – 2014-03-12 (×3): 650 mg via ORAL
  Filled 2014-03-08 (×3): qty 2

## 2014-03-08 MED ORDER — ARIPIPRAZOLE 10 MG PO TABS
20.0000 mg | ORAL_TABLET | Freq: Every day | ORAL | Status: DC
  Administered 2014-03-08 – 2014-03-11 (×4): 20 mg via ORAL
  Filled 2014-03-08 (×5): qty 2

## 2014-03-08 MED ORDER — ACETAMINOPHEN 650 MG RE SUPP: 650.0000 mg | Freq: Four times a day (QID) | RECTAL | Status: DC | PRN

## 2014-03-08 MED ORDER — ALBUTEROL (5 MG/ML) CONTINUOUS INHALATION SOLN
10.0000 mg/h | INHALATION_SOLUTION | RESPIRATORY_TRACT | Status: AC
  Administered 2014-03-08: 10 mg/h via RESPIRATORY_TRACT
  Filled 2014-03-08: qty 20

## 2014-03-08 MED ORDER — METHYLPREDNISOLONE SODIUM SUCC 125 MG IJ SOLR
60.0000 mg | Freq: Two times a day (BID) | INTRAMUSCULAR | Status: DC
  Administered 2014-03-08 – 2014-03-09 (×3): 60 mg via INTRAVENOUS
  Filled 2014-03-08: qty 0.96
  Filled 2014-03-08 (×3): qty 2
  Filled 2014-03-08: qty 0.96

## 2014-03-08 MED ORDER — DEXTROSE 5 % IV SOLN
500.0000 mg | INTRAVENOUS | Status: DC
  Administered 2014-03-09 – 2014-03-11 (×3): 500 mg via INTRAVENOUS
  Filled 2014-03-08 (×4): qty 0.5

## 2014-03-08 MED ORDER — FLUTICASONE PROPIONATE 50 MCG/ACT NA SUSP
1.0000 | Freq: Two times a day (BID) | NASAL | Status: DC
  Administered 2014-03-08 – 2014-03-12 (×9): 1 via NASAL
  Filled 2014-03-08 (×2): qty 16

## 2014-03-08 MED ORDER — SODIUM CHLORIDE 0.9 % IJ SOLN
3.0000 mL | Freq: Two times a day (BID) | INTRAMUSCULAR | Status: DC
Start: 2014-03-08 — End: 2014-03-12
  Administered 2014-03-08 – 2014-03-11 (×8): 3 mL via INTRAVENOUS
  Filled 2014-03-08: qty 3

## 2014-03-08 MED ORDER — SODIUM CHLORIDE 0.9 % IV BOLUS (SEPSIS)
1000.0000 mL | INTRAVENOUS | Status: AC
  Administered 2014-03-08: 1000 mL via INTRAVENOUS

## 2014-03-08 MED ORDER — ACETAMINOPHEN 650 MG RE SUPP
650.0000 mg | RECTAL | Status: DC | PRN
  Administered 2014-03-08: 650 mg via RECTAL
  Filled 2014-03-08: qty 1

## 2014-03-08 MED ORDER — BUPROPION HCL ER (XL) 300 MG PO TB24
300.0000 mg | ORAL_TABLET | Freq: Every day | ORAL | Status: DC
  Administered 2014-03-08 – 2014-03-12 (×5): 300 mg via ORAL
  Filled 2014-03-08 (×5): qty 1

## 2014-03-08 MED ORDER — MOMETASONE FURO-FORMOTEROL FUM 100-5 MCG/ACT IN AERO
2.0000 | INHALATION_SPRAY | Freq: Two times a day (BID) | RESPIRATORY_TRACT | Status: DC
  Administered 2014-03-08 – 2014-03-12 (×8): 2 via RESPIRATORY_TRACT
  Filled 2014-03-08 (×2): qty 8.8

## 2014-03-08 MED ORDER — SODIUM CHLORIDE 0.9 % IV SOLN
INTRAVENOUS | Status: DC
  Administered 2014-03-08 – 2014-03-09 (×2): via INTRAVENOUS

## 2014-03-08 MED ORDER — ATORVASTATIN CALCIUM 10 MG PO TABS
10.0000 mg | ORAL_TABLET | Freq: Every day | ORAL | Status: DC
  Administered 2014-03-08 – 2014-03-11 (×4): 10 mg via ORAL
  Filled 2014-03-08 (×6): qty 1

## 2014-03-08 NOTE — ED Notes (Signed)
538ml infused one liter to go

## 2014-03-08 NOTE — ED Notes (Signed)
Attempted to call report second attempt

## 2014-03-08 NOTE — ED Notes (Signed)
CODE SEPSIS ACTIVATED @0305 .

## 2014-03-08 NOTE — ED Notes (Signed)
The pt is more alert on the bi-pap. Foley placed temp fioley.  Tylenol supp will not stay in.reinserted x 3.  Iv tylenol ordered.  Daughters at the bedside now

## 2014-03-08 NOTE — H&P (Signed)
[facility] History and Physical  Martha Patton AST:[id number] DOB: 1950/11/07 DOA: 03/08/2014  Referring physician: EDP PCP: Loura Pardon, MD   Chief Complaint: AMS   HPI: Martha Patton is a 64 y.o. female brought in by ambulance to the ED with AMS.  Husband called EMS when he awoke from sleep to patients labored breathing.  Patient is unable to provide any history.  Husband states that patient had been more wheezy over the past few days but that was it.  Work up in the ED reveals patient to be profoundly septic.  Review of Systems: unable to perform due to AMS.  Past Medical History  Diagnosis Date  . COPD (chronic obstructive pulmonary disease)   . Hypertension   . Thyroid disease   . Osteoarthritis   . Bipolar 1 disorder   . Cancer of kidney   . Urinary retention   . Deafness     cochlear implants bilat  . Hyperlipidemia   . Allergy   . Macular degeneration     left eye  . Osteopenia   . Obstructive sleep apnea on CPAP   . E. coli UTI (urinary tract infection)   . Sepsis   . Renal failure    Past Surgical History  Procedure Laterality Date  . Appendectomy    . Cholecystectomy    . Abdominal hysterectomy    . Bladder suspension      bladder tack  . Nephrectomy      left  . Rotator cuff repair    . Tonsillectomy    . External ear surgery      x3  . Tibia fracture surgery      as a child  . Liver biopsy  02/1998    cysts  . Leep  2002  . Septoplasty  06/2003  . Cervical disc surgery     Social History:  reports that she has been smoking Cigarettes.  She has a 20 pack-year smoking history. She does not have any smokeless tobacco history on file. She reports that she drinks alcohol. She reports that she does not use illicit drugs.  Allergies  Allergen Reactions  . Spearmint Oil Hives  . Ezetimibe     REACTION: elevated lfts  . Fluvastatin Sodium     REACTION: elevated LFT's  . Naproxen     REACTION: GI  . Tape     Other reaction(s): UNKNOWN     History reviewed. No pertinent family history.   Prior to Admission medications   Medication Sig Start Date End Date Taking? Authorizing Provider  Aclidinium Bromide (TUDORZA PRESSAIR) 400 MCG/ACT AEPB Inhale 400 mcg into the lungs every 12 (twelve) hours.   Yes Historical Provider, MD  albuterol (PROVENTIL,VENTOLIN) 90 MCG/ACT inhaler Inhale 2 puffs into the lungs every 6 (six) hours as needed. For shortness of breath   Yes Historical Provider, MD  amLODipine-benazepril (LOTREL) 10-20 MG per capsule TAKE 1 CAPSULE BY MOUTH DAILY. 01/08/14  Yes Abner Greenspan, MD  ARIPiprazole (ABILIFY) 20 MG tablet Take 20 mg by mouth at bedtime.   Yes Historical Provider, MD  atorvastatin (LIPITOR) 10 MG tablet TAKE 1 TABLET (10 MG TOTAL) BY MOUTH DAILY. 09/21/13  Yes Abner Greenspan, MD  benazepril (LOTENSIN) 20 MG tablet Take 1 tablet (20 mg total) by mouth daily. 09/21/13  Yes Abner Greenspan, MD  buPROPion (WELLBUTRIN XL) 300 MG 24 hr tablet Take 300 mg by mouth daily.    Yes Historical Provider, MD  busPIRone (  BUSPAR) 15 MG tablet Take 15 mg by mouth 2 (two) times daily.     Yes Historical Provider, MD  clonazePAM (KLONOPIN) 0.5 MG tablet Take 0.5 mg by mouth 2 (two) times daily as needed. For nerves   Yes Historical Provider, MD  fluticasone (FLONASE) 50 MCG/ACT nasal spray 1 spray by Nasal route 2 (two) times daily.     Yes Historical Provider, MD  Fluticasone-Salmeterol (ADVAIR DISKUS) 250-50 MCG/DOSE AEPB Inhale 2 puffs into the lungs 2 (two) times daily.    Yes Historical Provider, MD  furosemide (LASIX) 40 MG tablet Take 1 tablet (40 mg total) by mouth daily. 12/11/13  Yes Abner Greenspan, MD  ibuprofen (ADVIL,MOTRIN) 200 MG tablet Take 200-400 mg by mouth every 6 (six) hours as needed. For pain   Yes Historical Provider, MD  levothyroxine (SYNTHROID, LEVOTHROID) 100 MCG tablet TAKE 1 TABLET (100 MCG TOTAL) BY MOUTH DAILY. 01/08/14  Yes Abner Greenspan, MD  Melatonin 3 MG TABS Takes 3-10 mg per night   Yes  Historical Provider, MD  Multiple Vitamins-Minerals (CENTRUM SILVER PO) Take 1 tablet by mouth daily.    Yes Historical Provider, MD  oxcarbazepine (TRILEPTAL) 600 MG tablet Take 600 mg by mouth 2 (two) times daily.    Yes Historical Provider, MD  potassium chloride (KLOR-CON 10) 10 MEQ tablet Take 1 tablet (10 mEq total) by mouth 3 (three) times daily. 12/11/13  Yes Abner Greenspan, MD  Tamsulosin HCl (FLOMAX) 0.4 MG CAPS Take 0.4 mg by mouth daily.    Yes Historical Provider, MD  traZODone (DESYREL) 100 MG tablet Take 150 mg by mouth at bedtime.    Yes Historical Provider, MD   Physical Exam: Filed Vitals:   03/08/14 0639  BP:   Pulse:   Temp: 102.9 F (39.4 C)  Resp:     BP 148/84 mmHg  Pulse 136  Temp(Src) 102.9 F (39.4 C) (Rectal)  Resp 29  Ht 5' 0.5" (1.537 m)  Wt 68.493 kg (151 lb)  BMI 28.99 kg/m2  SpO2 100%  LMP 01/20/2000  General Appearance:    In respiratory distress, toxic appearing, tachypnic, initially lethargic, after being on BIPAP she wakes up but is delirious trying to take out her foley appears stated age  Head:    Normocephalic, atraumatic  Eyes:    PERRL, EOMI, sclera non-icteric        Nose:   Nares without drainage or epistaxis. Mucosa, turbinates normal  Throat:   Moist mucous membranes. Oropharynx without erythema or exudate.  Neck:   Supple. No carotid bruits.  No thyromegaly.  No lymphadenopathy.   Back:     No CVA tenderness, no spinal tenderness  Lungs:     Wheezy, diminished over L base  Chest wall:    No tenderness to palpitation  Heart:    Tachycardic  Abdomen:     Soft, non-tender, nondistended, normal bowel sounds, no organomegaly  Genitalia:    deferred  Rectal:    deferred  Extremities:   No clubbing, cyanosis or edema.  Pulses:   2+ and symmetric all extremities  Skin:   Skin color, texture, turgor normal, no rashes or lesions  Lymph nodes:   Cervical, supraclavicular, and axillary nodes normal  Neurologic:   CNII-XII intact. Normal  strength, sensation and reflexes      throughout    Labs on Admission:  Basic Metabolic Panel:  Recent Labs Lab 03/08/14 0301  NA 133*  K 4.4  CL 98  CO2 24  GLUCOSE 127*  BUN 36*  CREATININE 3.07*  CALCIUM 8.7   Liver Function Tests:  Recent Labs Lab 03/08/14 0301  AST 29  ALT 15  ALKPHOS 76  BILITOT 0.6  PROT 6.4  ALBUMIN 2.9*   No results for input(s): LIPASE, AMYLASE in the last 168 hours. No results for input(s): AMMONIA in the last 168 hours. CBC:  Recent Labs Lab 03/08/14 0301  WBC 11.7*  NEUTROABS 10.5*  HGB 9.9*  HCT 28.9*  MCV 87.0  PLT 359   Cardiac Enzymes: No results for input(s): CKTOTAL, CKMB, CKMBINDEX, TROPONINI in the last 168 hours.  BNP (last 3 results) No results for input(s): PROBNP in the last 8760 hours. CBG: No results for input(s): GLUCAP in the last 168 hours.  Radiological Exams on Admission: Dg Chest Port 1 View  03/08/2014   CLINICAL DATA:  Dyspnea  EXAM: PORTABLE CHEST - 1 VIEW  COMPARISON:  02/10/2014  FINDINGS: There is left base consolidation, new from 02/10/2014. This could represent pneumonia. The right lung is clear. No large effusions are evident. Pulmonary vasculature is normal. Heart size is unchanged.  IMPRESSION: New left base consolidation. This could represent pneumonia. Follow-up radiography recommended to confirm complete clearing after treatment.   Electronically Signed   By: Andreas Newport M.D.   On: 03/08/2014 03:38    EKG: Independently reviewed.  Assessment/Plan Principal Problem:   Severe sepsis with acute organ dysfunction Active Problems:   UTI (lower urinary tract infection)   HCAP (healthcare-associated pneumonia)   Acute kidney failure   Acute on chronic respiratory failure with hypoxia   COPD (chronic obstructive pulmonary disease)   1. Severe sepsis with acute organ dysfunction - due to UTI and HCAP 1. Cefepime and vanc per pharm 2. PNA pathway 3. Blood, urine, sputum cultures  pending 4. Patient is critically Ill, prognosis is guarded at best, palliative care consult ordered for symptom management, but most likely patient will either turn around and get better or pass on shortly.  5. IV tylenol for fever as she expelled the suppository 6. May end up requiring 1:1 nursing initially given delirium  7. NPO, holding PO meds, synthroid converted to IV 2. COPD - there is an element of exacerbation to this secondary to the PNA 1. given wheezing will also put patient on solumedrol IV 2. neb treatments per adult wheeze protocol 3. AKF -  1. Due to sepsis 2. Holding ACEI and diuretics 3. IVF, got bolus in ED now at 125 cc/hr 4. Strict intake and output    Code Status: DNR/DNI - this is confirmed with the family members including husband and children who are at bedside.  The patient also has a living will at bedside.  Family Communication: Family at bedside, husband initially, 2 daughters come after he calls them with the bad news of her very guarded prognosis Disposition Plan: Admit to SDU   Time spent: 70 min  GARDNER, JARED M. [facility] Pager [phone]  If 7AM-7PM, please contact the day team taking care of the patient [email or web address] Password [facility] 03/08/2014, 6:54 AM

## 2014-03-08 NOTE — ED Provider Notes (Signed)
CSN: [id number]     Arrival date & time 03/08/14  0249 History  This chart was scribed for Martha Acosta, MD by Delphia Grates, ED Scribe. This patient was seen in room B15C/B15C and the patient's care was started at 2:54 AM.    Chief Complaint  Patient presents with  . Altered Mental Status   LEVEL 5 CAVEAT: ALTERED MENTAL STATUS  The history is provided by the spouse and the EMS personnel. No language interpreter was used.     HPI Comments: Martha Patton is a 64 y.o. female brought in by ambulance, who presents to the Emergency Department for AMS. Per EMS, patient is from home states husband called them out due to labored breathing.  EMS reports patient has strong UTI smell, is tachycardic and tachypneic. Per husband, patient felt hot and notes altered mental status with changes in breathing status, and difficulty sitting up in bed. Patient had slight cough earlier in the evening. He also reports history of  2 prior admissions in the last year for severe infections as well as history of left sided nephrectomy because of kidney cancer. Denies nausea, vomiting, or diarrhea.  Past Medical History  Diagnosis Date  . COPD (chronic obstructive pulmonary disease)   . Hypertension   . Thyroid disease   . Osteoarthritis   . Bipolar 1 disorder   . Cancer of kidney   . Urinary retention   . Deafness     cochlear implants bilat  . Hyperlipidemia   . Allergy   . Macular degeneration     left eye  . Osteopenia   . Obstructive sleep apnea on CPAP   . E. coli UTI (urinary tract infection)   . Sepsis   . Renal failure    Past Surgical History  Procedure Laterality Date  . Appendectomy    . Cholecystectomy    . Abdominal hysterectomy    . Bladder suspension      bladder tack  . Nephrectomy      left  . Rotator cuff repair    . Tonsillectomy    . External ear surgery      x3  . Tibia fracture surgery      as a child  . Liver biopsy  02/1998    cysts  . Leep  2002  .  Septoplasty  06/2003  . Cervical disc surgery     History reviewed. No pertinent family history. History  Substance Use Topics  . Smoking status: Current Some Day Smoker -- 0.50 packs/day for 40 years    Types: Cigarettes  . Smokeless tobacco: Not on file     Comment: has quit in the past on and off   . Alcohol Use: 0.0 oz/week    0 Standard drinks or equivalent per week     Comment: rare   OB History    No data available     Review of Systems  Unable to perform ROS: Mental status change      Allergies  Spearmint oil; Ezetimibe; Fluvastatin sodium; Naproxen; and Tape  Home Medications   Prior to Admission medications   Medication Sig Start Date End Date Taking? Authorizing Provider  Aclidinium Bromide (TUDORZA PRESSAIR) 400 MCG/ACT AEPB Inhale 400 mcg into the lungs every 12 (twelve) hours.   Yes Historical Provider, MD  albuterol (PROVENTIL,VENTOLIN) 90 MCG/ACT inhaler Inhale 2 puffs into the lungs every 6 (six) hours as needed. For shortness of breath   Yes Historical Provider, MD  amLODipine-benazepril (  LOTREL) 10-20 MG per capsule TAKE 1 CAPSULE BY MOUTH DAILY. 01/08/14  Yes Abner Greenspan, MD  ARIPiprazole (ABILIFY) 20 MG tablet Take 20 mg by mouth at bedtime.   Yes Historical Provider, MD  atorvastatin (LIPITOR) 10 MG tablet TAKE 1 TABLET (10 MG TOTAL) BY MOUTH DAILY. 09/21/13  Yes Abner Greenspan, MD  benazepril (LOTENSIN) 20 MG tablet Take 1 tablet (20 mg total) by mouth daily. 09/21/13  Yes Abner Greenspan, MD  buPROPion (WELLBUTRIN XL) 300 MG 24 hr tablet Take 300 mg by mouth daily.    Yes Historical Provider, MD  busPIRone (BUSPAR) 15 MG tablet Take 15 mg by mouth 2 (two) times daily.     Yes Historical Provider, MD  cephALEXin (KEFLEX) 500 MG capsule Take 1 capsule (500 mg total) by mouth 3 (three) times daily. Patient not taking: Reported on 03/08/2014 02/13/14   Verlee Monte, MD  clonazePAM (KLONOPIN) 0.5 MG tablet Take 0.5 mg by mouth 2 (two) times daily as needed. For  nerves   Yes Historical Provider, MD  fluticasone (FLONASE) 50 MCG/ACT nasal spray 1 spray by Nasal route 2 (two) times daily.     Yes Historical Provider, MD  Fluticasone-Salmeterol (ADVAIR DISKUS) 250-50 MCG/DOSE AEPB Inhale 2 puffs into the lungs 2 (two) times daily.    Yes Historical Provider, MD  furosemide (LASIX) 40 MG tablet Take 1 tablet (40 mg total) by mouth daily. 12/11/13  Yes Abner Greenspan, MD  ibuprofen (ADVIL,MOTRIN) 200 MG tablet Take 200-400 mg by mouth every 6 (six) hours as needed. For pain   Yes Historical Provider, MD  levothyroxine (SYNTHROID, LEVOTHROID) 100 MCG tablet TAKE 1 TABLET (100 MCG TOTAL) BY MOUTH DAILY. 01/08/14  Yes Abner Greenspan, MD  Melatonin 3 MG TABS Takes 3-10 mg per night   Yes Historical Provider, MD  Multiple Vitamins-Minerals (CENTRUM SILVER PO) Take 1 tablet by mouth daily.    Yes Historical Provider, MD  oxcarbazepine (TRILEPTAL) 600 MG tablet Take 600 mg by mouth 2 (two) times daily.    Yes Historical Provider, MD  potassium chloride (KLOR-CON 10) 10 MEQ tablet Take 1 tablet (10 mEq total) by mouth 3 (three) times daily. 12/11/13  Yes Abner Greenspan, MD  Tamsulosin HCl (FLOMAX) 0.4 MG CAPS Take 0.4 mg by mouth daily.    Yes Historical Provider, MD  traZODone (DESYREL) 100 MG tablet Take 150 mg by mouth at bedtime.    Yes Historical Provider, MD   BP 161/118 mmHg  Pulse 115  Temp(Src) 98.6 F (37 C) (Rectal)  Resp 38  Ht 5' 0.5" (1.537 m)  Wt 151 lb (68.493 kg)  BMI 28.99 kg/m2  SpO2 99%  LMP 01/20/2000 Physical Exam  Constitutional: She appears well-developed and well-nourished. No distress.  HENT:  Head: Normocephalic and atraumatic.  Mouth/Throat: Oropharynx is clear and moist. No oropharyngeal exudate.  Dry mm.  Eyes: Conjunctivae and EOM are normal. Pupils are equal, round, and reactive to light. Right eye exhibits no discharge. Left eye exhibits no discharge. No scleral icterus.  Neck: Normal range of motion. Neck supple. No JVD  present. No thyromegaly present.  Cardiovascular: Regular rhythm, normal heart sounds and intact distal pulses.  Tachycardia present.  Exam reveals no gallop and no friction rub.   No murmur heard. Pulmonary/Chest: Effort normal. Tachypnea noted. No respiratory distress. She has no wheezes. She has no rales.  Mild tachypnea; decreased breath sounds at the bases.  Abdominal: Soft. Bowel sounds are normal. She exhibits  no distension and no mass. There is no tenderness.  Musculoskeletal: Normal range of motion. She exhibits edema. She exhibits no tenderness.  Bilateral lower extremity, symmetrical pitting edema below the knees.   Lymphadenopathy:    She has no cervical adenopathy.  Neurological: She is alert. Coordination normal.  Follows commands, minimal speech. Keeps eyes closed.  Skin: Skin is warm and dry. No rash noted. No erythema.  Psychiatric: She has a normal mood and affect. Her behavior is normal.  Nursing note and vitals reviewed.   ED Course  Procedures (including critical care time)  DIAGNOSTIC STUDIES: Oxygen Saturation is 93% on room air, adequate by my interpretation.    COORDINATION OF CARE: At [facility], prior sepsis. Needs work-up for sepsis.  Labs Review Labs Reviewed  CBC WITH DIFFERENTIAL/PLATELET - Abnormal; Notable for the following:    WBC 11.7 (*)    RBC 3.32 (*)    Hemoglobin 9.9 (*)    HCT 28.9 (*)    Neutrophils Relative % 90 (*)    Neutro Abs 10.5 (*)    Lymphocytes Relative 5 (*)    Lymphs Abs 0.6 (*)    All other components within normal limits  COMPREHENSIVE METABOLIC PANEL - Abnormal; Notable for the following:    Sodium 133 (*)    Glucose, Bld 127 (*)    BUN 36 (*)    Creatinine, Ser 3.07 (*)    Albumin 2.9 (*)    GFR calc non Af Amer 15 (*)    GFR calc Af Amer 17 (*)    All other components within normal limits  URINALYSIS, ROUTINE W REFLEX MICROSCOPIC - Abnormal; Notable for the following:    APPearance CLOUDY (*)    Hgb  urine dipstick MODERATE (*)    Protein, ur 30 (*)    Nitrite POSITIVE (*)    Leukocytes, UA MODERATE (*)    All other components within normal limits  URINE MICROSCOPIC-ADD ON - Abnormal; Notable for the following:    Bacteria, UA MANY (*)    All other components within normal limits  CULTURE, BLOOD (ROUTINE X 2)  CULTURE, BLOOD (ROUTINE X 2)  URINE CULTURE  BRAIN NATRIURETIC PEPTIDE  I-STAT CG4 LACTIC ACID, ED  I-STAT CG4 LACTIC ACID, ED    Imaging Review Dg Chest Port 1 View  03/08/2014   CLINICAL DATA:  Dyspnea  EXAM: PORTABLE CHEST - 1 VIEW  COMPARISON:  02/10/2014  FINDINGS: There is left base consolidation, new from 02/10/2014. This could represent pneumonia. The right lung is clear. No large effusions are evident. Pulmonary vasculature is normal. Heart size is unchanged.  IMPRESSION: New left base consolidation. This could represent pneumonia. Follow-up radiography recommended to confirm complete clearing after treatment.   Electronically Signed   By: Andreas Newport M.D.   On: 03/08/2014 03:38     EKG Interpretation   Date/Time:  Thursday March 08 2014 04:14:25 EST Ventricular Rate:  109 PR Interval:  173 QRS Duration: 139 QT Interval:  365 QTC Calculation: 491 R Axis:   -149 Text Interpretation:  Sinus tachycardia Right bundle branch block Minimal  ST elevation, inferior leads Abnormal ekg since last tracing no  significant change Confirmed by Madigan Rosensteel  MD, Azhane Eckart ([id number]) on 03/08/2014  4:22:25 AM      MDM   Final diagnoses:  Sepsis, due to unspecified organism  HCAP (healthcare-associated pneumonia)  Renal failure  UTI (lower urinary tract infection)    The patient appears very ill with altered mental  status and no tachypnea. She has severe COPD, has been recently evaluated by pulmonary and has been told that her COPD is now in the severe category and no longer level IV. She has had a decline in her status since arrival with increased respiratory effort and  is now very tachypneic and in respiratory distress requiring a continuous nebulizer therapy. She is receiving broad-spectrum antibiotics for her healthcare associated pneumonia, she is septic, she is critically ill, medications were given as below.  IV fluids given at 30 mL/kg  Discussed with Dr. Alcario Drought who is at the bedside evaluating the patient. Living well reviewed, DO NOT RESUSCITATE status isn't placed, the patient does request ongoing medications as needed for treating infection and to make her comfortable. We will abstain from intubation at the family members request.  CRITICAL CARE Performed by: Martha Patton Total critical care time: 35 Critical care time was exclusive of separately billable procedures and treating other patients. Critical care was necessary to treat or prevent imminent or life-threatening deterioration. Critical care was time spent personally by me on the following activities: development of treatment plan with patient and/or surrogate as well as nursing, discussions with consultants, evaluation of patient's response to treatment, examination of patient, obtaining history from patient or surrogate, ordering and performing treatments and interventions, ordering and review of laboratory studies, ordering and review of radiographic studies, pulse oximetry and re-evaluation of patient's condition.   I personally performed the services described in this documentation, which was scribed in my presence. The recorded information has been reviewed and is accurate.    Martha Acosta, MD 03/08/14 [phone]

## 2014-03-08 NOTE — ED Notes (Signed)
Pt presents to ED via EMS for altered mental status, SOB, and fever. Per family, pt seem confused during the day. Pt confused at arrival.

## 2014-03-08 NOTE — Progress Notes (Addendum)
PROGRESS NOTE    Martha Patton WJX:[id number] DOB: 07-02-1950 DOA: 03/08/2014 PCP: Loura Pardon, MD  Primary psychiatrist: Dr. Ulis Rias in [place] Primary pulmonologist: Dr. Ancil Linsey in [place] Primary urologist: Dr. Edrick Oh in [place]  HPI/Brief narrative 64 year old female, lives at home with spouse, extensive PMH including bipolar disorder, short-term memory loss, end-stage COPD on 2 L/m home oxygen, extremely hard of hearing, recurrent UTIs, HTN, HLD, OSA on nightly C Pap presented to the [facility] ED on 03/08/14 with dyspnea. In the ED, temperature 103.2, tachycardic up to 140s, tachypnea in the 30s, chest x-ray with pneumonia and urine microscopy suggestive of UTI. Admitted for sepsis secondary to pneumonia and UTI, acute renal failure. Admitting M.D. & this MD discussed at length with patients family-patient is DO NOT RESUSCITATE but they would like to continue other aggressive treatment including IV fluids, antibiotics and BiPAP if needed.    Assessment/Plan:  1. Severe sepsis secondary to HCAP & UTI: Continue IV cefepime and vancomycin pending culture results. IV fluids. Hemodynamically stable. 2. LLL HCAP: IV vancomycin and cefepime. 3. Recurrent urinary tract infection: IV cefepime pending urine culture results. 4. Acute renal failure: Secondary to dehydration, sepsis, diuretics, NSAID's and ACEI. Hold culprit medications. Treat sepsis. IV fluids and follow BMP. 5. Acute on chronic hypoxic respiratory failure: Secondary to pneumonia and COPD exacerbation complicating underlying end-stage COPD & OSA. IV antibiotics, bronchodilators, IV Solu-Medrol and oxygen. Improving. 6. COPD exacerbation: Precipitated by pneumonia. Management as above. 7. Acute encephalopathy: Secondary to acute medical illness as above complicating underlying bipolar disorder and dementia. No focal deficits. Treat underlying cause and monitor. Agitation from earlier this morning  seems to have improved. NPO except medications until patient consistently alert and safe to take diet by mouth 8. Bipolar disorder: Patient on multiple psychiatric medications as outpatient. Continue same and monitor. 9. Anemia: Follow CBCs. 10. Extremely hard of hearing 11. Possible dementia 12. OSA: Continue nightly sleep apnea 13. Essential hypertension: Controlled 14. Hypothyroid: Continue Synthroid.   Code Status: DO NOT RESUSCITATE-confirmed with patient's spouse and 2 daughters at bedside. Family Communication: Discussed with patient's spouse and 2 daughters at bedside. Due to critical illness and poor prognosis, palliative care was consulted by admitting M.D. for goals of care. Disposition Plan: Home when medically stable.   Consultants:  Palliative care team  Procedures:  Foley catheter  Antibiotics:  IV vancomycin and cefepime   Subjective: Patient asking if she can go home. As per family, dyspnea and agitation have improved compared to earlier this morning.  Objective: Filed Vitals:   03/08/14 0657 03/08/14 0730 03/08/14 0747 03/08/14 0749  BP: 104/82 118/65  128/82  Pulse: 113 116  116  Temp: 103.2 F (39.6 C) 102.6 F (39.2 C) 102.2 F (39 C) 102.2 F (39 C)  TempSrc:      Resp: 30 31  29   Height:      Weight:      SpO2: 93% 97%  96%    Intake/Output Summary (Last 24 hours) at 03/08/14 0818 Last data filed at 03/08/14 2130  Gross per 24 hour  Intake   2600 ml  Output    100 ml  Net   2500 ml   Filed Weights   03/08/14 0349  Weight: 68.493 kg (151 lb)     Exam:  General exam: Small built and moderately nourished middle-aged female lying comfortably propped up on the gurney in no obvious distress currently. Respiratory system: Diminished breath sounds bilaterally with scattered  few medium pitched expiratory rhonchi and basal crackles. No increased work of breathing. Cardiovascular system: S1 & S2 heard, regular and mild tachycardia. No JVD,  murmurs, gallops, clicks. 1+ bilateral leg edema. Telemetry: Sinus tachycardia Gastrointestinal system: Abdomen is nondistended, soft and nontender. Normal bowel sounds heard. Central nervous system: Alert and follows some simple instructions. No focal neurological deficits. Extremities: Symmetric 5 x 5 power.   Data Reviewed: Basic Metabolic Panel:  Recent Labs Lab 03/08/14 0301  NA 133*  K 4.4  CL 98  CO2 24  GLUCOSE 127*  BUN 36*  CREATININE 3.07*  CALCIUM 8.7   Liver Function Tests:  Recent Labs Lab 03/08/14 0301  AST 29  ALT 15  ALKPHOS 76  BILITOT 0.6  PROT 6.4  ALBUMIN 2.9*   No results for input(s): LIPASE, AMYLASE in the last 168 hours. No results for input(s): AMMONIA in the last 168 hours. CBC:  Recent Labs Lab 03/08/14 0301  WBC 11.7*  NEUTROABS 10.5*  HGB 9.9*  HCT 28.9*  MCV 87.0  PLT 359   Cardiac Enzymes: No results for input(s): CKTOTAL, CKMB, CKMBINDEX, TROPONINI in the last 168 hours. BNP (last 3 results) No results for input(s): PROBNP in the last 8760 hours. CBG: No results for input(s): GLUCAP in the last 168 hours.  No results found for this or any previous visit (from the past 240 hour(s)).      Studies: Dg Chest Port 1 View  03/08/2014   CLINICAL DATA:  Dyspnea  EXAM: PORTABLE CHEST - 1 VIEW  COMPARISON:  02/10/2014  FINDINGS: There is left base consolidation, new from 02/10/2014. This could represent pneumonia. The right lung is clear. No large effusions are evident. Pulmonary vasculature is normal. Heart size is unchanged.  IMPRESSION: New left base consolidation. This could represent pneumonia. Follow-up radiography recommended to confirm complete clearing after treatment.   Electronically Signed   By: Andreas Newport M.D.   On: 03/08/2014 03:38        Scheduled Meds: . heparin  5,000 Units Subcutaneous 3 times per day  . levothyroxine  50 mcg Intravenous Daily  . methylPREDNISolone (SOLU-MEDROL) injection  60 mg  Intravenous Q12H  . mometasone-formoterol  2 puff Inhalation BID  . sodium chloride  3 mL Intravenous Q12H   Continuous Infusions: . sodium chloride 125 mL/hr at 03/08/14 0733  . [START ON 03/09/2014] ceFEPime (MAXIPIME) IV    . [START ON 03/10/2014] vancomycin      Principal Problem:   Severe sepsis with acute organ dysfunction Active Problems:   UTI (lower urinary tract infection)   HCAP (healthcare-associated pneumonia)   Acute kidney failure   Acute on chronic respiratory failure with hypoxia   COPD (chronic obstructive pulmonary disease)    Time spent: 30 minutes.    Vernell Leep, MD, FACP, FHM. [facility] Pager [phone]  If 7PM-7AM, please contact night-coverage [email or web address] Password [email or web address] 03/08/2014, 8:18 AM    LOS: 0 days

## 2014-03-08 NOTE — Progress Notes (Addendum)
1820 Pt arrived from ED Linen under pt soiled with urine, pt also has a urinary catheter . Alert/oriented CHG bath done MRSA swab done. Oriented to room and nurse call system

## 2014-03-08 NOTE — ED Notes (Signed)
Mittens removed. Pt gave herself her flonase.

## 2014-03-08 NOTE — Progress Notes (Signed)
CRITICAL VALUE ALERT  Critical value received:  03/08/2014   Date of notification: 03/08/2014     Time of notification:  11:22 PM   Critical value read back:Yes.    Nurse who received alert:  Monico Hoar, RN  MD notified (1st page):  Baltazar Najjar, NP  Time of first page:  11:23 PM    Responding MD:  Baldo Ash  Time MD responded:  11:25PM

## 2014-03-08 NOTE — Progress Notes (Signed)
PT ID using MRI and birthdate

## 2014-03-08 NOTE — ED Notes (Signed)
The pt has vancomycin iv hung

## 2014-03-08 NOTE — ED Notes (Signed)
The pts respirations are more labored.  Wheezes have increased and she is more agitated.  Dr Sabra Heck in to see hhn ordered

## 2014-03-08 NOTE — ED Notes (Signed)
2000 ml nss bolus infused  Antibiotic hung

## 2014-03-08 NOTE — ED Notes (Signed)
Lab at the bedside for lactic acid draw

## 2014-03-08 NOTE — ED Notes (Signed)
Attempted to call report

## 2014-03-08 NOTE — ED Notes (Signed)
Admitting doctor at the bedside.  resp therapy placing on bi-pap

## 2014-03-08 NOTE — Discharge Planning (Signed)
Spoke with pt children regarding discharge planning.  Children requested information for [facility] services and [employer]gencies so they can begin research.  Information provided.  Explained that NCM on unit will finalize set-up with them prior to discharge home.  Children verbalize understanding.

## 2014-03-08 NOTE — ED Notes (Signed)
Iv tylenoil hung

## 2014-03-08 NOTE — ED Notes (Signed)
Pt understanding most lip reading. Hearing aids not working. Daughters at the bedside. Writing things down that are too complicated to lip read.

## 2014-03-08 NOTE — ED Notes (Signed)
Family at the bedside holding pt's hands. She keeps trying to pull at her catheter and her IV lines. Mittens explained to family and they agreed to try them.

## 2014-03-08 NOTE — Progress Notes (Signed)
ANTIBIOTIC CONSULT NOTE - INITIAL  Pharmacy Consult for Vancomycin  Indication: rule out sepsis  Allergies  Allergen Reactions  . Spearmint Oil Hives  . Ezetimibe     REACTION: elevated lfts  . Fluvastatin Sodium     REACTION: elevated LFT's  . Naproxen     REACTION: GI  . Tape     Other reaction(s): UNKNOWN    Patient Measurements: Height: 5' 0.5" (153.7 cm) Weight: 151 lb (68.493 kg) IBW/kg (Calculated) : 46.65  Vital Signs: Temp: 98.6 F (37 C) (02/18 0447) Temp Source: Rectal (02/18 0326) BP: 185/108 mmHg (02/18 0610) Pulse Rate: 140 (02/18 0610)  Labs:  Recent Labs  03/08/14 0301  WBC 11.7*  HGB 9.9*  PLT 359  CREATININE 3.07*   Estimated Creatinine Clearance: 16.2 mL/min (by C-G formula based on Cr of 3.07). No results for input(s): VANCOTROUGH, VANCOPEAK, VANCORANDOM, GENTTROUGH, GENTPEAK, GENTRANDOM, TOBRATROUGH, TOBRAPEAK, TOBRARND, AMIKACINPEAK, AMIKACINTROU, AMIKACIN in the last 72 hours.    Medical History: Past Medical History  Diagnosis Date  . COPD (chronic obstructive pulmonary disease)   . Hypertension   . Thyroid disease   . Osteoarthritis   . Bipolar 1 disorder   . Cancer of kidney   . Urinary retention   . Deafness     cochlear implants bilat  . Hyperlipidemia   . Allergy   . Macular degeneration     left eye  . Osteopenia   . Obstructive sleep apnea on CPAP   . E. coli UTI (urinary tract infection)   . Sepsis   . Renal failure     Assessment: 64 y/o F with AMS, labored breathing, febrile, WBC mildly elevated, noted renal dysfunction, other labs as above.   Goal of Therapy:  Vancomycin trough level 15-20 mcg/ml  Plan:  -Vancomycin 1000 mg IV q48h -Cefepime 500 mg IV q24h -Trend WBC, temp, renal function  -Drug levels as indicated   Narda Bonds 03/08/2014,6:38 AM

## 2014-03-08 NOTE — ED Notes (Signed)
PATIENT COMPLAINING OF PAIN WITH HER FOLEY. IT IS DRAINING AND NOTED SOME CLOTS NOTED IN TUBING. FAMILY REPORTS PT BEGAN COMPLAINING OF PAIN WHEN SHE WAS MOVED. DAUGHTER STATES SHE THINKS IT GOT PULLED WHEN "THEY CAUGHT IT ON THE DOOR WHEN THEY WERE MOVING HER". NO VISIBLE BLOOD NOTED VAGINALLY. NO LEAKAGE NOTED. DONNED STERILE GLOVES AND DEFLATED BALLOON A LITTLE AND ADJUSTED CATHETER. DID NOT REMOVE IT. REPLACED WATER IN BALLOON AND PT INDICATED THAT IT FEELS BETTER NOW.

## 2014-03-08 NOTE — ED Notes (Signed)
Bilateral mittens applied to pt. Husband at the bedside.

## 2014-03-09 DIAGNOSIS — R7881 Bacteremia: Secondary | ICD-10-CM

## 2014-03-09 DIAGNOSIS — R0602 Shortness of breath: Secondary | ICD-10-CM

## 2014-03-09 DIAGNOSIS — R531 Weakness: Secondary | ICD-10-CM

## 2014-03-09 DIAGNOSIS — Z515 Encounter for palliative care: Secondary | ICD-10-CM

## 2014-03-09 LAB — BASIC METABOLIC PANEL
ANION GAP: 7 (ref 5–15)
BUN: 37 mg/dL — AB (ref 6–23)
CALCIUM: 7.7 mg/dL — AB (ref 8.4–10.5)
CO2: 22 mmol/L (ref 19–32)
CREATININE: 2.52 mg/dL — AB (ref 0.50–1.10)
Chloride: 104 mmol/L (ref 96–112)
GFR calc Af Amer: 22 mL/min — ABNORMAL LOW (ref >90)
GFR calc non Af Amer: 19 mL/min — ABNORMAL LOW (ref >90)
Glucose, Bld: 98 mg/dL (ref 70–99)
Potassium: 4.5 mmol/L (ref 3.5–5.1)
Sodium: 133 mmol/L — ABNORMAL LOW (ref 135–145)

## 2014-03-09 LAB — CBC
HCT: 25 % — ABNORMAL LOW (ref 36.0–46.0)
HEMOGLOBIN: 8.2 g/dL — AB (ref 12.0–15.0)
MCH: 29 pg (ref 26.0–34.0)
MCHC: 32.8 g/dL (ref 30.0–36.0)
MCV: 88.3 fL (ref 78.0–100.0)
Platelets: 307 10*3/uL (ref 150–400)
RBC: 2.83 MIL/uL — ABNORMAL LOW (ref 3.87–5.11)
RDW: 14.5 % (ref 11.5–15.5)
WBC: 12.1 10*3/uL — ABNORMAL HIGH (ref 4.0–10.5)

## 2014-03-09 LAB — EXPECTORATED SPUTUM ASSESSMENT W GRAM STAIN, RFLX TO RESP C

## 2014-03-09 LAB — LEGIONELLA ANTIGEN, URINE

## 2014-03-09 LAB — EXPECTORATED SPUTUM ASSESSMENT W REFEX TO RESP CULTURE

## 2014-03-09 LAB — HIV ANTIBODY (ROUTINE TESTING W REFLEX): HIV Screen 4th Generation wRfx: NONREACTIVE

## 2014-03-09 MED ORDER — SODIUM CHLORIDE 0.9 % IV SOLN
INTRAVENOUS | Status: DC
Start: 2014-03-09 — End: 2014-03-10
  Administered 2014-03-09: 18:00:00 via INTRAVENOUS
  Administered 2014-03-09: 75 mL/h via INTRAVENOUS
  Administered 2014-03-10: 06:00:00 via INTRAVENOUS

## 2014-03-09 MED ORDER — LEVOTHYROXINE SODIUM 100 MCG PO TABS
100.0000 ug | ORAL_TABLET | Freq: Every day | ORAL | Status: DC
  Administered 2014-03-09 – 2014-03-12 (×4): 100 ug via ORAL
  Filled 2014-03-09 (×5): qty 1

## 2014-03-09 MED ORDER — NICOTINE 7 MG/24HR TD PT24
7.0000 mg | MEDICATED_PATCH | Freq: Every day | TRANSDERMAL | Status: DC
  Administered 2014-03-09 – 2014-03-12 (×4): 7 mg via TRANSDERMAL
  Filled 2014-03-09 (×4): qty 1

## 2014-03-09 MED ORDER — ALBUTEROL SULFATE (2.5 MG/3ML) 0.083% IN NEBU
3.0000 mL | INHALATION_SOLUTION | RESPIRATORY_TRACT | Status: DC | PRN
  Administered 2014-03-10: 3 mL via RESPIRATORY_TRACT
  Filled 2014-03-09: qty 3

## 2014-03-09 MED ORDER — METHYLPREDNISOLONE SODIUM SUCC 40 MG IJ SOLR
40.0000 mg | Freq: Two times a day (BID) | INTRAMUSCULAR | Status: DC
  Administered 2014-03-09 – 2014-03-11 (×4): 40 mg via INTRAVENOUS
  Filled 2014-03-09 (×8): qty 1

## 2014-03-09 MED ORDER — IPRATROPIUM-ALBUTEROL 0.5-2.5 (3) MG/3ML IN SOLN
3.0000 mL | Freq: Three times a day (TID) | RESPIRATORY_TRACT | Status: DC
  Administered 2014-03-09 – 2014-03-12 (×9): 3 mL via RESPIRATORY_TRACT
  Filled 2014-03-09 (×9): qty 3

## 2014-03-09 MED ORDER — AMLODIPINE BESYLATE 2.5 MG PO TABS
2.5000 mg | ORAL_TABLET | Freq: Every day | ORAL | Status: DC
Start: 2014-03-09 — End: 2014-03-11
  Administered 2014-03-09 – 2014-03-11 (×3): 2.5 mg via ORAL
  Filled 2014-03-09 (×3): qty 1

## 2014-03-09 NOTE — Progress Notes (Addendum)
PROGRESS NOTE    Martha Patton OIZ:[id number] DOB: Dec 16, 1950 DOA: 03/08/2014 PCP: Loura Pardon, MD  Primary psychiatrist: Dr. Ulis Rias in [place] Primary pulmonologist: Dr. Ancil Linsey in [place] Primary urologist: Dr. 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 In the ED, temperature 103.2, tachycardic up to 140s, tachypnea in the 30s, chest x-ray with pneumonia and urine microscopy suggestive of UTI. Admitted for sepsis secondary to pneumonia and UTI, acute renal failure. Admitting M.D. & this MD discussed at length with patients family-patient is DO NOT RESUSCITATE but they would like to continue other aggressive treatment including IV fluids, antibiotics and BiPAP if needed.    Assessment/Plan:  1. Severe sepsis secondary to HCAP, UTI & gram-negative rod bacteremia (likely Escherichia coli): Continue IV cefepime and vancomycin pending culture results. IV fluids. Hemodynamically stable. 2. Gram-negative rod bacteremia (likely Escherichia coli): 1 of 2 blood cultures from 2/18 shows gram-negative rods. Secondary to UTI. Continue IV cefepime pending final urine culture results. 3. LLL HCAP: IV vancomycin and cefepime. 4. Recurrent urinary tract infection/Escherichia coli: IV cefepime pending urine culture results. 5. Acute renal failure/solitary kidney: Creatinine 02/13/14:0.78. Secondary to dehydration, sepsis, diuretics, NSAID's and ACEI. Hold culprit medications. Treat sepsis. IV fluids and follow BMP. Improving, creatinine 3.07 > 2.52. Continue IV fluids for additional 24 hours. Start diet and encourage oral fluid intake. 6. Acute on chronic hypoxic respiratory failure: Secondary to  pneumonia and COPD exacerbation complicating underlying end-stage COPD & OSA. IV antibiotics, bronchodilators, IV Solu-Medrol and oxygen. Acute respiratory failure resolved 7. COPD exacerbation: Precipitated by pneumonia. Management as above. Reduce and continue IV Solu-Medrol for additional 24 hours then start oral prednisone taper. Improved 8. Acute encephalopathy: Secondary to acute medical illness as above complicating underlying bipolar disorder and dementia. No focal deficits. Treated underlying cause and monitor. Mental status back to baseline. Start regular diet. 9. Bipolar disorder: Patient on multiple psychiatric medications as outpatient. Continue same and monitor. Stable. 10. Anemia: Hemoglobin is dropped from 9.9 > 8.2. Likely dilutional. No reported bleeding. Follow CBC in a.m. and transfuse if hemoglobin <7 g per DL. 11. Extremely hard of hearing 12. Possible dementia 13. OSA: Continue nightly sleep apnea 14. Essential hypertension: Blood pressures starting to creep up. Hold ACEI. Resume amlodipine at low dose. 15. Hypothyroid: Continue Synthroid.   Code Status: DO NOT RESUSCITATE. Family Communication: Discussed with patient's spouse and a daughter at bedside.  Disposition Plan: Home when medically stable. Monitor in stepdown unit for additional 24 hours.   Consultants:  Palliative care team-input pending  Procedures:  Foley catheter-DC 2/19  Antibiotics:  IV vancomycin and cefepime   Subjective: Patient hungry and asking for food. Denies complaints. As per family at bedside, mental status back to baseline and patient has progressively done well since yesterday morning in terms of her dyspnea and mental status.  Objective: Filed Vitals:   03/09/14 0000 03/09/14 0400 03/09/14 0759 03/09/14 0811  BP: 116/76  147/84   Pulse: 94  87   Temp: 98.8 F (37.1 C) 99.1 F (37.3 C) 98 F (36.7 C)   TempSrc: Oral Axillary Oral   Resp: 21  22   Height:      Weight:        SpO2:  97%  97% 98%    Intake/Output Summary (Last 24 hours) at 03/09/14 0843 Last data filed at 03/09/14 0604  Gross per 24 hour  Intake   1503 ml  Output   1050 ml  Net    453 ml   Filed Weights   03/08/14 0349 03/08/14 1800  Weight: 68.493 kg (151 lb) 74.3 kg (163 lb 12.8 oz)     Exam:  General exam: Small built and moderately nourished middle-aged female lying comfortably propped up on the gurney in no obvious distress currently. Looks much improved compared to yesterday. Respiratory system: Distant breath sounds but clear to auscultation. No increased work of breathing. Cardiovascular system: S1 & S2 heard, RRR. No JVD, murmurs, gallops, clicks. Trace bilateral leg edema. Telemetry: SR in the 90s. Gastrointestinal system: Abdomen is nondistended, soft and nontender. Normal bowel sounds heard. Central nervous system: Alert and follows instructions. No focal neurological deficits. Extremely hard of hearing. Hearing aid in left ear. Extremities: Symmetric 5 x 5 power.   Data Reviewed: Basic Metabolic Panel:  Recent Labs Lab 03/08/14 0301 03/09/14 0515  NA 133* 133*  K 4.4 4.5  CL 98 104  CO2 24 22  GLUCOSE 127* 98  BUN 36* 37*  CREATININE 3.07* 2.52*  CALCIUM 8.7 7.7*   Liver Function Tests:  Recent Labs Lab 03/08/14 0301  AST 29  ALT 15  ALKPHOS 76  BILITOT 0.6  PROT 6.4  ALBUMIN 2.9*   No results for input(s): LIPASE, AMYLASE in the last 168 hours. No results for input(s): AMMONIA in the last 168 hours. CBC:  Recent Labs Lab 03/08/14 0301 03/09/14 0515  WBC 11.7* 12.1*  NEUTROABS 10.5*  --   HGB 9.9* 8.2*  HCT 28.9* 25.0*  MCV 87.0 88.3  PLT 359 307   Cardiac Enzymes: No results for input(s): CKTOTAL, CKMB, CKMBINDEX, TROPONINI in the last 168 hours. BNP (last 3 results) No results for input(s): PROBNP in the last 8760 hours. CBG: No results for input(s): GLUCAP in the last 168 hours.  Recent Results (from the past 240 hour(s))  Blood  Culture (routine x 2)     Status: None (Preliminary result)   Collection Time: 03/08/14  3:10 AM  Result Value Ref Range Status   Specimen Description BLOOD LEFT ARM  Final   Special Requests BOTTLES DRAWN AEROBIC AND ANAEROBIC 10CC  Final   Culture   Final    GRAM NEGATIVE RODS Performed at [facility]    Report Status PENDING  Incomplete  Blood Culture (routine x 2)     Status: None (Preliminary result)   Collection Time: 03/08/14  3:16 AM  Result Value Ref Range Status   Specimen Description BLOOD LEFT FOREARM  Final   Special Requests BOTTLES DRAWN AEROBIC AND ANAEROBIC 5CC  Final   Culture   Final           BLOOD CULTURE RECEIVED NO GROWTH TO DATE CULTURE WILL BE HELD FOR 5 DAYS BEFORE ISSUING A FINAL NEGATIVE REPORT Performed at [facility]    Report Status PENDING  Incomplete  Urine culture     Status: None (Preliminary result)   Collection Time: 03/08/14  3:25 AM  Result Value Ref Range Status   Specimen Description URINE, CATHETERIZED  Final   Special Requests NONE  Final   Colony Count   Final    80,000 COLONIES/ML Performed at [facility]    Culture   Final    ESCHERICHIA COLI Performed  at [facility]    Report Status PENDING  Incomplete  MRSA PCR Screening     Status: None   Collection Time: 03/08/14  6:36 PM  Result Value Ref Range Status   MRSA by PCR NEGATIVE NEGATIVE Final    Comment:        The GeneXpert MRSA Assay (FDA approved for NASAL specimens only), is one component of a comprehensive MRSA colonization surveillance program. It is not intended to diagnose MRSA infection nor to guide or monitor treatment for MRSA infections.         Studies: Dg Chest Port 1 View  03/08/2014   CLINICAL DATA:  Dyspnea  EXAM: PORTABLE CHEST - 1 VIEW  COMPARISON:  02/10/2014  FINDINGS: There is left base consolidation, new from 02/10/2014. This could represent pneumonia. The right lung is clear. No large effusions are  evident. Pulmonary vasculature is normal. Heart size is unchanged.  IMPRESSION: New left base consolidation. This could represent pneumonia. Follow-up radiography recommended to confirm complete clearing after treatment.   Electronically Signed   By: Andreas Newport M.D.   On: 03/08/2014 03:38        Scheduled Meds: . antiseptic oral rinse  7 mL Mouth Rinse BID  . ARIPiprazole  20 mg Oral QHS  . atorvastatin  10 mg Oral q1800  . buPROPion  300 mg Oral Daily  . busPIRone  15 mg Oral BID  . ceFEPime (MAXIPIME) IV  500 mg Intravenous Q24H  . fluticasone  1 spray Each Nare BID  . heparin  5,000 Units Subcutaneous 3 times per day  . ipratropium-albuterol  3 mL Nebulization QID  . methylPREDNISolone (SOLU-MEDROL) injection  40 mg Intravenous Q12H  . mometasone-formoterol  2 puff Inhalation BID  . oxcarbazepine  600 mg Oral BID  . sodium chloride  3 mL Intravenous Q12H  . tamsulosin  0.4 mg Oral Daily  . traZODone  150 mg Oral QHS  . [START ON 03/10/2014] vancomycin  1,000 mg Intravenous Q48H   Continuous Infusions: . sodium chloride      Principal Problem:   Severe sepsis with acute organ dysfunction Active Problems:   UTI (lower urinary tract infection)   HCAP (healthcare-associated pneumonia)   Acute kidney failure   Acute on chronic respiratory failure with hypoxia   COPD (chronic obstructive pulmonary disease)    Time spent: 40 minutes.    Vernell Leep, MD, FACP, FHM. [facility] Pager [phone]  If 7PM-7AM, please contact night-coverage [email or web address] Password [facility] 03/09/2014, 8:43 AM    LOS: 1 day

## 2014-03-09 NOTE — Evaluation (Signed)
Physical Therapy Evaluation Patient Details Name: Martha Patton MRN: [id number] DOB: 1950-02-20 Today's Date: 03/09/2014   History of Present Illness  Patient is a 64 y/o female admitted with urosepsis and pneumonia.  PMH positive for bipolar d/c, STM loss, end stage COPD on home O2, HOH, recurrent UTI, HTN, HLD, OSA on C-pap.  Clinical Impression  Patient presents with decreased independence with mobility due to deficits listed in PT problem list. She will benefit from skilled PT in the acute setting to allow return home with family assist and HHPT.    Follow Up Recommendations Home health PT;Supervision/Assistance - 24 hour [facility] aide)    Equipment Recommendations  None recommended by PT    Recommendations for Other Services       Precautions / Restrictions Precautions Precautions: Fall Precaution Comments: O2 dependent      Mobility  Bed Mobility Overal bed mobility: Needs Assistance Bed Mobility: Supine to Sit;Sit to Supine     Supine to sit: Supervision;HOB elevated Sit to supine: Min assist;HOB elevated   General bed mobility comments: assist to return to supine due to fatigue and to scoot to head of bed for proper support for breathing  Transfers Overall transfer level: Needs assistance Equipment used: Rolling walker (2 wheeled) Transfers: Sit to/from Stand Sit to Stand: Min guard         General transfer comment: for safety from higher surface of bed in ICU  Ambulation/Gait Ambulation/Gait assistance: Min assist Ambulation Distance (Feet): 200 Feet Assistive device: Rolling walker (2 wheeled) Gait Pattern/deviations: Step-through pattern;Drifts right/left;Trunk flexed;Shuffle     General Gait Details: assist for walker due to veers into walls especially when looking to one side; fatigued and dyspneic  Stairs            Wheelchair Mobility    Modified Rankin (Stroke Patients Only)       Balance Overall balance assessment: Needs assistance            Standing balance-Leahy Scale: Fair Standing balance comment: support for safety with ambulation                             Pertinent Vitals/Pain Pain Assessment: No/denies pain    Home Living Family/patient expects to be discharged to:: Private residence Living Arrangements: Spouse/significant other;Children Available Help at Discharge: Family;Available 24 hours/day Type of Home: House Home Access: Stairs to enter Entrance Stairs-Rails: None Entrance Stairs-Number of Steps: 1&1 Home Layout: Two level Home Equipment: Walker - 4 wheels;Shower seat - built in;Grab bars - tub/shower;Hand held shower head;Cane - single point;Wheelchair - manual      Prior Function Level of Independence: Independent         Comments: was independent and only used w/c when going like to the zoo, would walk through [place] with cart and O2.  Recent fall in January and previously when had UTI's.     Hand Dominance        Extremity/Trunk Assessment               Lower Extremity Assessment: Generalized weakness      Cervical / Trunk Assessment: Kyphotic  Communication   Communication: HOH (cochlear inplants)  Cognition Arousal/Alertness: Awake/alert Behavior During Therapy: WFL for tasks assessed/performed Overall Cognitive Status: Within Functional Limits for tasks assessed                      General Comments General comments (skin integrity, edema,  etc.): Family in room: spouse & daughter giving lots of history due to pt's hearing difficulties and some memory issues    Exercises        Assessment/Plan    PT Assessment Patient needs continued PT services  PT Diagnosis Abnormality of gait;Generalized weakness   PT Problem List Decreased strength;Decreased activity tolerance;Decreased mobility;Cardiopulmonary status limiting activity;Decreased knowledge of use of DME;Decreased safety awareness  PT Treatment Interventions DME  instruction;Balance training;Gait training;Functional mobility training;Patient/family education;Therapeutic activities;Therapeutic exercise   PT Goals (Current goals can be found in the Care Plan section) Acute Rehab PT Goals Patient Stated Goal: To return to independent PT Goal Formulation: With patient/family Time For Goal Achievement: 03/16/14 Potential to Achieve Goals: Good    Frequency Min 3X/week   Barriers to discharge        Co-evaluation               End of Session Equipment Utilized During Treatment: Oxygen Activity Tolerance: Patient limited by fatigue Patient left: in bed;with call bell/phone within reach;with family/visitor present           Time: 0175-1025 PT Time Calculation (min) (ACUTE ONLY): 34 min   Charges:   PT Evaluation $Initial PT Evaluation Tier I: 1 Procedure PT Treatments $Gait Training: 8-22 mins   PT G Codes:        WYNN,CYNDI March 23, 2014, 5:16 PM Magda Kiel, [place] 2014-03-23

## 2014-03-09 NOTE — Consult Note (Signed)
Patient DD:ANMLS Martha Patton      DOB: [birth year]      UPP:[id number]     Consult Note from the Palliative Medicine Team at [facility]    Consult Requested by: Dr. Rondell Reams     PCP: Roxy Manns, MD Reason for Consultation: GOC     Phone [phone]  Assessment of patients Current state: I met today with Martha Patton and her husband and daughters Lockhart and Alona Bene. They updated me on her very difficult past history. They tell me that they have noticed gradual decline in her COPD and shortness of breath. However, she cooks most days and still does laundry and some housework. They also say they disagree because she still drives close by the house - the children do not believe this is safe. We discussed a preparedness plan as Mr. Jacko works part-time. Children say that they can stay with her if he has to be out of town and first alert. We discussed having friends/family check in with her when she is at home alone. Family is very supportive of her. They tell me that she is DNR and they have living will. They were scared she was going to die this admission and were prepared to make arrangements. I do not believe she is likely hospice eligible at this point and I did not discuss this with them today. I hope to further our conversation with them Monday.    Goals of Care: 1.  Code Status: DNR   2. Scope of Treatment: Continue supportive care with antibiotics, IVF.    4. Disposition: Home with home health.    3. Symptom Management:   1. Shortness of breath: Continue oxygen therapy and CPAP qhs.  2. Weakness: Continue medical management. Recommend PT/OT.   4. Psychosocial: Emotional support provided to patient and to family.   5. Spiritual: Receiving communion upon first entering room.    Brief HPI: 64 yo female admitted with altered mental status r/t sepsis with HCAP, UTI, and gram-negative rod bacteremia (likely E. Coli). 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 We were unsure she was to survive this hospitalization but seems to have turned around and beginning to recover.    ROS: + shortness of breath intermittently    PMH:  Past Medical History  Diagnosis Date  . COPD (chronic obstructive pulmonary disease)   . Hypertension   . Thyroid disease   . Osteoarthritis   . Bipolar 1 disorder   . Cancer of kidney   . Urinary retention   . Deafness     cochlear implants bilat  . Hyperlipidemia   . Allergy   . Macular degeneration     left eye  . Osteopenia   . Obstructive sleep apnea on CPAP   . E. coli UTI (urinary tract infection)   . Sepsis   . Renal failure      PSH: Past Surgical History  Procedure Laterality Date  . Appendectomy    . Cholecystectomy    . Abdominal hysterectomy    . Bladder suspension      bladder tack  . Nephrectomy      left  . Rotator cuff repair    . Tonsillectomy    . External ear surgery      x3  . Tibia fracture surgery      as a  child  . Liver biopsy  02/1998    cysts  . Leep  2002  . Septoplasty  06/2003  . Cervical disc surgery     I have reviewed the FH and SH and  If appropriate update it with new information. Allergies  Allergen Reactions  . Spearmint Oil Hives  . Ezetimibe     REACTION: elevated lfts  . Fluvastatin Sodium     REACTION: elevated LFT's  . Naproxen     REACTION: GI  . Tape     Other reaction(s): UNKNOWN   Scheduled Meds: . amLODipine  2.5 mg Oral Daily  . antiseptic oral rinse  7 mL Mouth Rinse BID  . ARIPiprazole  20 mg Oral QHS  . atorvastatin  10 mg Oral q1800  . buPROPion  300 mg Oral Daily  . busPIRone  15 mg Oral BID  . ceFEPime (MAXIPIME) IV  500 mg Intravenous Q24H  . fluticasone  1 spray Each Nare BID  . heparin  5,000 Units Subcutaneous 3 times per day  . ipratropium-albuterol  3 mL Nebulization TID  .  levothyroxine  100 mcg Oral QAC breakfast  . methylPREDNISolone (SOLU-MEDROL) injection  40 mg Intravenous Q12H  . mometasone-formoterol  2 puff Inhalation BID  . oxcarbazepine  600 mg Oral BID  . sodium chloride  3 mL Intravenous Q12H  . tamsulosin  0.4 mg Oral Daily  . traZODone  150 mg Oral QHS  . [START ON 03/10/2014] vancomycin  1,000 mg Intravenous Q48H   Continuous Infusions: . sodium chloride 75 mL/hr (03/09/14 0952)   PRN Meds:.acetaminophen **OR** acetaminophen, albuterol, clonazePAM    BP 149/81 mmHg  Pulse 87  Temp(Src) 98 F (36.7 C) (Oral)  Resp 22  Ht 5' (1.524 m)  Wt 74.3 kg (163 lb 12.8 oz)  BMI 31.99 kg/m2  SpO2 95%  LMP 01/20/2000   PPS: 40%   Intake/Output Summary (Last 24 hours) at 03/09/14 1105 Last data filed at 03/09/14 0951  Gross per 24 hour  Intake   1503 ml  Output   1350 ml  Net    153 ml   LBM: 03/09/14  Physical Exam:  General: NAD, lying in bed HEENT: [place]/AT, no JVD, moist mucous membranes, bilat cochlear implants Chest: No labored breathing, symmetric CVS: RRR, S1 S2 Abdomen: Soft, NT, ND Ext: MAE, no edema, warm to touch Neuro: Awake alert, oriented x 3  Labs: CBC    Component Value Date/Time   WBC 12.1* 03/09/2014 0515   RBC 2.83* 03/09/2014 0515   HGB 8.2* 03/09/2014 0515   HCT 25.0* 03/09/2014 0515   PLT 307 03/09/2014 0515   MCV 88.3 03/09/2014 0515   MCH 29.0 03/09/2014 0515   MCHC 32.8 03/09/2014 0515   RDW 14.5 03/09/2014 0515   LYMPHSABS 0.6* 03/08/2014 0301   MONOABS 0.6 03/08/2014 0301   EOSABS 0.1 03/08/2014 0301   BASOSABS 0.0 03/08/2014 0301    BMET    Component Value Date/Time   NA 133* 03/09/2014 0515   K 4.5 03/09/2014 0515   CL 104 03/09/2014 0515   CO2 22 03/09/2014 0515   GLUCOSE 98 03/09/2014 0515   BUN 37* 03/09/2014 0515   CREATININE 2.52* 03/09/2014 0515   CREATININE 0.85 05/23/2010 1713   CALCIUM 7.7* 03/09/2014 0515   GFRNONAA 19* 03/09/2014 0515   GFRAA 22* 03/09/2014 0515     CMP     Component Value Date/Time   NA 133* 03/09/2014 0515   K 4.5 03/09/2014 0515  CL 104 03/09/2014 0515   CO2 22 03/09/2014 0515   GLUCOSE 98 03/09/2014 0515   BUN 37* 03/09/2014 0515   CREATININE 2.52* 03/09/2014 0515   CREATININE 0.85 05/23/2010 1713   CALCIUM 7.7* 03/09/2014 0515   PROT 6.4 03/08/2014 0301   ALBUMIN 2.9* 03/08/2014 0301   AST 29 03/08/2014 0301   ALT 15 03/08/2014 0301   ALKPHOS 76 03/08/2014 0301   BILITOT 0.6 03/08/2014 0301   GFRNONAA 19* 03/09/2014 0515   GFRAA 22* 03/09/2014 0515    Time In Time Out Total Time Spent with Patient Total Overall Time  1120 1240 2min 35min    Greater than 50%  of this time was spent counseling and coordinating care related to the above assessment and plan.   Vinie Sill, NP Palliative Medicine Team Pager # [phone] (M-F 8a-5p) Team Phone # [phone] (Nights/Weekends)

## 2014-03-09 NOTE — Progress Notes (Signed)
Utilization Review Completed.  

## 2014-03-10 LAB — BASIC METABOLIC PANEL
Anion gap: 6 (ref 5–15)
BUN: 33 mg/dL — ABNORMAL HIGH (ref 6–23)
CO2: 22 mmol/L (ref 19–32)
Calcium: 7.6 mg/dL — ABNORMAL LOW (ref 8.4–10.5)
Chloride: 106 mmol/L (ref 96–112)
Creatinine, Ser: 1.88 mg/dL — ABNORMAL HIGH (ref 0.50–1.10)
GFR calc non Af Amer: 27 mL/min — ABNORMAL LOW (ref >90)
GFR, EST AFRICAN AMERICAN: 31 mL/min — AB (ref >90)
Glucose, Bld: 143 mg/dL — ABNORMAL HIGH (ref 70–99)
POTASSIUM: 4.2 mmol/L (ref 3.5–5.1)
SODIUM: 134 mmol/L — AB (ref 135–145)

## 2014-03-10 LAB — CBC
HCT: 24.6 % — ABNORMAL LOW (ref 36.0–46.0)
Hemoglobin: 8.2 g/dL — ABNORMAL LOW (ref 12.0–15.0)
MCH: 29.3 pg (ref 26.0–34.0)
MCHC: 33.3 g/dL (ref 30.0–36.0)
MCV: 87.9 fL (ref 78.0–100.0)
PLATELETS: 319 10*3/uL (ref 150–400)
RBC: 2.8 MIL/uL — ABNORMAL LOW (ref 3.87–5.11)
RDW: 14.6 % (ref 11.5–15.5)
WBC: 10.1 10*3/uL (ref 4.0–10.5)

## 2014-03-10 LAB — URINE CULTURE

## 2014-03-10 MED ORDER — HYDRALAZINE HCL 20 MG/ML IJ SOLN
5.0000 mg | Freq: Once | INTRAMUSCULAR | Status: AC
  Administered 2014-03-10: 5 mg via INTRAVENOUS
  Filled 2014-03-10: qty 1

## 2014-03-10 NOTE — Progress Notes (Signed)
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 In the ED, temperature 103.2, tachycardic up to 140s, tachypnea in the 30s, chest x-ray with pneumonia and urine microscopy suggestive of UTI. Admitted for sepsis secondary to pneumonia and UTI, acute renal failure. Admitting M.D. & this MD discussed at length with patients family-patient is DO NOT RESUSCITATE but they would like to continue other aggressive treatment including IV fluids, antibiotics and BiPAP if needed.    Assessment/Plan:  1. Severe sepsis secondary to HCAP, UTI & gram-negative rod bacteremia (likely Escherichia coli): Continue IV cefepime and vancomycin pending culture results. IV fluids. Hemodynamically stable.  -Discontinue vancomycin 2. Gram-negative rod bacteremia (likely Escherichia coli): 1 of 2 blood cultures from 2/18 shows gram-negative rods. Secondary to UTI. Continue IV cefepime pending final urine culture results. 3. LLL HCAP: IV vancomycin and cefepime. -Sputum Gram stain negative for organisms  4. Recurrent urinary tract infection/Escherichia coli: IV cefepime pending urine culture results. 5. Acute renal failure/solitary kidney: Creatinine 02/13/14:0.78. Secondary to dehydration, sepsis, diuretics, NSAID's and ACEI. Hold culprit medications. Treat sepsis. IV fluids and follow BMP. Improving, creatinine 3.07 > 2.52>>1.88  -Saline lock Fluids, advance diet  6. Acute on chronic hypoxic respiratory failure: Secondary to pneumonia and COPD exacerbation complicating underlying end-stage COPD & OSA. IV antibiotics, bronchodilators, IV Solu-Medrol and  oxygen. Acute respiratory failure resolved  -Presently stable on 2 L nasal cannula which is the patient's baseline  requirement at home  7. COPD exacerbation: Precipitated by pneumonia. Management as above. Reduce and continue IV Solu-Medrol for now as she continues to have some wheezing  8. Acute encephalopathy: Secondary to acute medical illness as above complicating underlying bipolar disorder and dementia. No focal deficits. Treated underlying cause and monitor. Mental status back to baseline. Start regular diet. 9. Bipolar disorder: Patient on multiple psychiatric medications as outpatient. Continue same and monitor. Stable. 10. Anemia: Hemoglobin is dropped from 9.9 > 8.2. Likely dilutional. No reported bleeding. Follow CBC in a.m. and transfuse if hemoglobin <7 g per DL. 11. Extremely hard of hearing  -Patient has cochlear implant 12. Possible dementia 13. OSA: Continue nightly sleep apnea 14. Essential hypertension: Blood pressures starting to creep up. Hold ACEI. Resume amlodipine at low dose.  -Blood pressure acceptable 15. Hypothyroid: Continue Synthroid.   Code Status: DO NOT RESUSCITATE. Family Communication: Discussed with patient's spouse at bedside Disposition Plan: Home when medically stable; transferred to telemetry unit     Procedures/Studies: Dg Chest 2 View  02/10/2014   CLINICAL DATA:  Fever, COPD  EXAM: CHEST  2 VIEW  COMPARISON:  03/30/2011  FINDINGS: Cardiomediastinal silhouette is stable. Mild hyperinflation. No acute infiltrate or pleural effusion. No pulmonary edema. Mild degenerative changes thoracic spine and bilateral shoulders.  IMPRESSION: No active disease. Mild hyperinflation. Degenerative changes thoracic spine and bilateral shoulders.   Electronically Signed   By: Lahoma Crocker M.D.   On: 02/10/2014 19:04   Dg Chest Port 1 View  03/08/2014   CLINICAL DATA:  Dyspnea  EXAM: PORTABLE CHEST - 1 VIEW  COMPARISON:  02/10/2014  FINDINGS: There is left base  consolidation, new from 02/10/2014. This  could represent pneumonia. The right lung is clear. No large effusions are evident. Pulmonary vasculature is normal. Heart size is unchanged.  IMPRESSION: New left base consolidation. This could represent pneumonia. Follow-up radiography recommended to confirm complete clearing after treatment.   Electronically Signed   By: Andreas Newport M.D.   On: 03/08/2014 03:38         Subjective: Patient denies fevers, chills, headache, chest pain, dyspnea, nausea, vomiting, diarrhea, abdominal pain, dysuria, hematuria   Objective: Filed Vitals:   03/10/14 0419 03/10/14 0740 03/10/14 0801 03/10/14 0826  BP: 149/91 175/99  148/86  Pulse: 89 86  88  Temp: 98.6 F (37 C) 97.8 F (36.6 C)    TempSrc: Oral Oral    Resp: 21 16  20   Height:      Weight:      SpO2: 97% 100% 98% 97%    Intake/Output Summary (Last 24 hours) at 03/10/14 0932 Last data filed at 03/10/14 0608  Gross per 24 hour  Intake   1438 ml  Output    301 ml  Net   1137 ml   Weight change:  Exam:   General:  Pt is alert, follows commands appropriately, not in acute distress  HEENT: No icterus, No thrush, [place]/AT  Cardiovascular: RRR, S1/S2, no rubs, no gallops  Respiratory: Left greater than right crackles. Scattered wheezes.   Abdomen: Soft/+BS, non tender, non distended, no guarding  Extremities: 1+LE edema, No lymphangitis, No petechiae, No rashes, no synovitis  Data Reviewed: Basic Metabolic Panel:  Recent Labs Lab 03/08/14 0301 03/09/14 0515 03/10/14 0353  NA 133* 133* 134*  K 4.4 4.5 4.2  CL 98 104 106  CO2 24 22 22   GLUCOSE 127* 98 143*  BUN 36* 37* 33*  CREATININE 3.07* 2.52* 1.88*  CALCIUM 8.7 7.7* 7.6*   Liver Function Tests:  Recent Labs Lab 03/08/14 0301  AST 29  ALT 15  ALKPHOS 76  BILITOT 0.6  PROT 6.4  ALBUMIN 2.9*   No results for input(s): LIPASE, AMYLASE in the last 168 hours. No results for input(s): AMMONIA in the last 168  hours. CBC:  Recent Labs Lab 03/08/14 0301 03/09/14 0515 03/10/14 0353  WBC 11.7* 12.1* 10.1  NEUTROABS 10.5*  --   --   HGB 9.9* 8.2* 8.2*  HCT 28.9* 25.0* 24.6*  MCV 87.0 88.3 87.9  PLT 359 307 319   Cardiac Enzymes: No results for input(s): CKTOTAL, CKMB, CKMBINDEX, TROPONINI in the last 168 hours. BNP: Invalid input(s): POCBNP CBG: No results for input(s): GLUCAP in the last 168 hours.  Recent Results (from the past 240 hour(s))  Blood Culture (routine x 2)     Status: None (Preliminary result)   Collection Time: 03/08/14  3:10 AM  Result Value Ref Range Status   Specimen Description BLOOD LEFT ARM  Final   Special Requests BOTTLES DRAWN AEROBIC AND ANAEROBIC 10CC  Final   Culture   Final    GRAM NEGATIVE RODS Note: Gram Stain Report Called to,Read Back By and Verified With: MIKAYLA INGRAM ON 2.18.2016 AT 11:06P BY WILEJ Performed at [facility]    Report Status PENDING  Incomplete  Blood Culture (routine x 2)     Status: None (Preliminary result)   Collection Time: 03/08/14  3:16 AM  Result Value Ref Range Status   Specimen Description BLOOD LEFT FOREARM  Final   Special Requests BOTTLES DRAWN AEROBIC AND ANAEROBIC 5CC  Final   Culture   Final  BLOOD CULTURE RECEIVED NO GROWTH TO DATE CULTURE WILL BE HELD FOR 5 DAYS BEFORE ISSUING A FINAL NEGATIVE REPORT Performed at [facility]    Report Status PENDING  Incomplete  Urine culture     Status: None (Preliminary result)   Collection Time: 03/08/14  3:25 AM  Result Value Ref Range Status   Specimen Description URINE, CATHETERIZED  Final   Special Requests NONE  Final   Colony Count   Final    80,000 COLONIES/ML Performed at [facility]    Culture   Final    ESCHERICHIA COLI Performed at [facility]    Report Status PENDING  Incomplete  MRSA PCR Screening     Status: None   Collection Time: 03/08/14  6:36 PM  Result Value Ref Range Status   MRSA by PCR NEGATIVE  NEGATIVE Final    Comment:        The GeneXpert MRSA Assay (FDA approved for NASAL specimens only), is one component of a comprehensive MRSA colonization surveillance program. It is not intended to diagnose MRSA infection nor to guide or monitor treatment for MRSA infections.   Culture, sputum-assessment     Status: None   Collection Time: 03/09/14 12:41 PM  Result Value Ref Range Status   Specimen Description SPUTUM  Final   Special Requests NONE  Final   Sputum evaluation   Final    THIS SPECIMEN IS ACCEPTABLE. RESPIRATORY CULTURE REPORT TO FOLLOW.   Report Status 03/09/2014 FINAL  Final  Culture, respiratory (NON-Expectorated)     Status: None (Preliminary result)   Collection Time: 03/09/14 12:41 PM  Result Value Ref Range Status   Specimen Description SPUTUM  Final   Special Requests NONE  Final   Gram Stain   Final    FEW WBC PRESENT,BOTH PMN AND MONONUCLEAR RARE SQUAMOUS EPITHELIAL CELLS PRESENT NO ORGANISMS SEEN Performed at [facility]    Culture PENDING  Incomplete   Report Status PENDING  Incomplete     Scheduled Meds: . amLODipine  2.5 mg Oral Daily  . ARIPiprazole  20 mg Oral QHS  . atorvastatin  10 mg Oral q1800  . buPROPion  300 mg Oral Daily  . busPIRone  15 mg Oral BID  . ceFEPime (MAXIPIME) IV  500 mg Intravenous Q24H  . fluticasone  1 spray Each Nare BID  . heparin  5,000 Units Subcutaneous 3 times per day  . ipratropium-albuterol  3 mL Nebulization TID  . levothyroxine  100 mcg Oral QAC breakfast  . methylPREDNISolone (SOLU-MEDROL) injection  40 mg Intravenous Q12H  . mometasone-formoterol  2 puff Inhalation BID  . nicotine  7 mg Transdermal Daily  . oxcarbazepine  600 mg Oral BID  . sodium chloride  3 mL Intravenous Q12H  . tamsulosin  0.4 mg Oral Daily  . traZODone  150 mg Oral QHS  . vancomycin  1,000 mg Intravenous Q48H   Continuous Infusions:    Lilton Pare, DO  [facility] Pager [phone]  If 7PM-7AM, please contact  night-coverage [email or web address] Password [facility] 03/10/2014, 9:32 AM   LOS: 2 days

## 2014-03-10 NOTE — Progress Notes (Signed)
Received report from [place] [facility]

## 2014-03-10 NOTE — Progress Notes (Signed)
Patient transferred from [facility] by [facility]. Placed on Tele 2 Skin intact. Oriented to room, family at bedside.

## 2014-03-10 NOTE — Progress Notes (Signed)
Transferred pt to 5W30.  Pt settled into room with family at bedside.

## 2014-03-11 DIAGNOSIS — IMO0001 Reserved for inherently not codable concepts without codable children: Secondary | ICD-10-CM | POA: Insufficient documentation

## 2014-03-11 LAB — BASIC METABOLIC PANEL
ANION GAP: 7 (ref 5–15)
BUN: 29 mg/dL — AB (ref 6–23)
CO2: 22 mmol/L (ref 19–32)
Calcium: 8.2 mg/dL — ABNORMAL LOW (ref 8.4–10.5)
Chloride: 107 mmol/L (ref 96–112)
Creatinine, Ser: 1.76 mg/dL — ABNORMAL HIGH (ref 0.50–1.10)
GFR calc Af Amer: 34 mL/min — ABNORMAL LOW (ref >90)
GFR calc non Af Amer: 29 mL/min — ABNORMAL LOW (ref >90)
Glucose, Bld: 105 mg/dL — ABNORMAL HIGH (ref 70–99)
Potassium: 3.8 mmol/L (ref 3.5–5.1)
SODIUM: 136 mmol/L (ref 135–145)

## 2014-03-11 LAB — CBC
HEMATOCRIT: 27.6 % — AB (ref 36.0–46.0)
HEMOGLOBIN: 9.1 g/dL — AB (ref 12.0–15.0)
MCH: 29.3 pg (ref 26.0–34.0)
MCHC: 33 g/dL (ref 30.0–36.0)
MCV: 88.7 fL (ref 78.0–100.0)
PLATELETS: 322 10*3/uL (ref 150–400)
RBC: 3.11 MIL/uL — ABNORMAL LOW (ref 3.87–5.11)
RDW: 14.7 % (ref 11.5–15.5)
WBC: 9.3 10*3/uL (ref 4.0–10.5)

## 2014-03-11 LAB — CULTURE, BLOOD (ROUTINE X 2)

## 2014-03-11 MED ORDER — CIPROFLOXACIN HCL 500 MG PO TABS
500.0000 mg | ORAL_TABLET | Freq: Every day | ORAL | Status: DC
  Administered 2014-03-11: 500 mg via ORAL
  Filled 2014-03-11 (×2): qty 1

## 2014-03-11 MED ORDER — PREDNISONE 20 MG PO TABS
40.0000 mg | ORAL_TABLET | Freq: Every day | ORAL | Status: DC
  Administered 2014-03-12: 40 mg via ORAL
  Filled 2014-03-11 (×2): qty 2

## 2014-03-11 MED ORDER — AMLODIPINE BESYLATE 5 MG PO TABS
5.0000 mg | ORAL_TABLET | Freq: Once | ORAL | Status: AC
  Administered 2014-03-11: 5 mg via ORAL
  Filled 2014-03-11: qty 1

## 2014-03-11 MED ORDER — AMLODIPINE BESYLATE 10 MG PO TABS
10.0000 mg | ORAL_TABLET | Freq: Every day | ORAL | Status: DC
  Administered 2014-03-12: 10 mg via ORAL
  Filled 2014-03-11: qty 1

## 2014-03-11 MED ORDER — AMLODIPINE BESYLATE 10 MG PO TABS: 10.0000 mg | ORAL_TABLET | Freq: Every day | ORAL | Status: AC

## 2014-03-11 NOTE — Progress Notes (Signed)
ANTIBIOTIC CONSULT NOTE - FOLLOW UP  Pharmacy Consult for cefepime Indication: E-coli bacteremia in 1/2 blood cx and UTI  Allergies  Allergen Reactions  . Spearmint Oil Hives  . Ezetimibe     REACTION: elevated lfts  . Fluvastatin Sodium     REACTION: elevated LFT's  . Naproxen     REACTION: GI  . Tape     Other reaction(s): UNKNOWN    Patient Measurements: Height: 5' (152.4 cm) Weight: 163 lb 12.8 oz (74.3 kg) IBW/kg (Calculated) : 45.5 Adjusted Body Weight:   Vital Signs: Temp: 98.4 F (36.9 C) (02/21 0630) Temp Source: Oral (02/21 0630) BP: 177/95 mmHg (02/21 0630) Pulse Rate: 94 (02/21 0630) Intake/Output from previous day: 02/20 0701 - 02/21 0700 In: 213 [P.O.:160; I.V.:3; IV Piggyback:50] Out: 1 [Urine:1] Intake/Output from this shift:    Labs:  Recent Labs  03/09/14 0515 03/10/14 0353 03/11/14 0537  WBC 12.1* 10.1 9.3  HGB 8.2* 8.2* 9.1*  PLT 307 319 322  CREATININE 2.52* 1.88* 1.76*   Estimated Creatinine Clearance: 29.1 mL/min (by C-G formula based on Cr of 1.76). No results for input(s): VANCOTROUGH, VANCOPEAK, VANCORANDOM, GENTTROUGH, GENTPEAK, GENTRANDOM, TOBRATROUGH, TOBRAPEAK, TOBRARND, AMIKACINPEAK, AMIKACINTROU, AMIKACIN in the last 72 hours.   Microbiology: Recent Results (from the past 720 hour(s))  Urine culture     Status: None   Collection Time: 02/10/14  6:44 PM  Result Value Ref Range Status   Specimen Description URINE, CLEAN CATCH  Final   Special Requests NONE  Final   Colony Count   Final    >=100,000 COLONIES/ML Performed at [facility]    Culture   Final    PROTEUS MIRABILIS Performed at [facility]    Report Status 02/13/2014 FINAL  Final   Organism ID, Bacteria PROTEUS MIRABILIS  Final      Susceptibility   Proteus mirabilis - MIC*    AMPICILLIN <=2 SENSITIVE Sensitive     CEFAZOLIN <=4 SENSITIVE Sensitive     CEFTRIAXONE <=1 SENSITIVE Sensitive     CIPROFLOXACIN <=0.25 SENSITIVE Sensitive    GENTAMICIN <=1 SENSITIVE Sensitive     LEVOFLOXACIN <=0.12 SENSITIVE Sensitive     NITROFURANTOIN 128 RESISTANT Resistant     TOBRAMYCIN <=1 SENSITIVE Sensitive     TRIMETH/SULFA <=20 SENSITIVE Sensitive     PIP/TAZO <=4 SENSITIVE Sensitive     * PROTEUS MIRABILIS  Blood culture (routine x 2)     Status: None   Collection Time: 02/10/14  7:05 PM  Result Value Ref Range Status   Specimen Description BLOOD RIGHT ARM  Final   Special Requests BOTTLES DRAWN AEROBIC AND ANAEROBIC 10CC EA  Final   Culture   Final    NO GROWTH 5 DAYS Performed at [facility]    Report Status 02/17/2014 FINAL  Final  Blood culture (routine x 2)     Status: None   Collection Time: 02/10/14  7:15 PM  Result Value Ref Range Status   Specimen Description BLOOD RIGHT HAND  Final   Special Requests BOTTLES DRAWN AEROBIC ONLY 10CC  Final   Culture   Final    NO GROWTH 5 DAYS Performed at [facility]    Report Status 02/17/2014 FINAL  Final  Blood Culture (routine x 2)     Status: None   Collection Time: 03/08/14  3:10 AM  Result Value Ref Range Status   Specimen Description BLOOD LEFT ARM  Final   Special Requests BOTTLES DRAWN AEROBIC AND  ANAEROBIC 10CC  Final   Culture   Final    ESCHERICHIA COLI Note: Gram Stain Report Called to,Read Back By and Verified With: MIKAYLA INGRAM ON 2.18.2016 AT 11:06P BY WILEJ Performed at [facility]    Report Status 03/11/2014 FINAL  Final   Organism ID, Bacteria ESCHERICHIA COLI  Final      Susceptibility   Escherichia coli - MIC*    AMPICILLIN 4 SENSITIVE Sensitive     AMPICILLIN/SULBACTAM <=2 SENSITIVE Sensitive     CEFAZOLIN <=4 SENSITIVE Sensitive     CEFEPIME <=1 SENSITIVE Sensitive     CEFTAZIDIME <=1 SENSITIVE Sensitive     CEFTRIAXONE <=1 SENSITIVE Sensitive     CIPROFLOXACIN <=0.25 SENSITIVE Sensitive     GENTAMICIN <=1 SENSITIVE Sensitive     IMIPENEM <=0.25 SENSITIVE Sensitive     PIP/TAZO <=4 SENSITIVE Sensitive      TOBRAMYCIN <=1 SENSITIVE Sensitive     TRIMETH/SULFA <=20 SENSITIVE Sensitive     * ESCHERICHIA COLI  Blood Culture (routine x 2)     Status: None (Preliminary result)   Collection Time: 03/08/14  3:16 AM  Result Value Ref Range Status   Specimen Description BLOOD LEFT FOREARM  Final   Special Requests BOTTLES DRAWN AEROBIC AND ANAEROBIC 5CC  Final   Culture   Final           BLOOD CULTURE RECEIVED NO GROWTH TO DATE CULTURE WILL BE HELD FOR 5 DAYS BEFORE ISSUING A FINAL NEGATIVE REPORT Performed at [facility]    Report Status PENDING  Incomplete  Urine culture     Status: None   Collection Time: 03/08/14  3:25 AM  Result Value Ref Range Status   Specimen Description URINE, CATHETERIZED  Final   Special Requests NONE  Final   Colony Count   Final    80,000 COLONIES/ML Performed at [facility]    Culture   Final    ESCHERICHIA COLI Performed at [facility]    Report Status 03/10/2014 FINAL  Final   Organism ID, Bacteria ESCHERICHIA COLI  Final      Susceptibility   Escherichia coli - MIC*    AMPICILLIN 4 SENSITIVE Sensitive     CEFAZOLIN <=4 SENSITIVE Sensitive     CEFTRIAXONE <=1 SENSITIVE Sensitive     CIPROFLOXACIN <=0.25 SENSITIVE Sensitive     GENTAMICIN <=1 SENSITIVE Sensitive     LEVOFLOXACIN <=0.12 SENSITIVE Sensitive     NITROFURANTOIN <=16 SENSITIVE Sensitive     TOBRAMYCIN <=1 SENSITIVE Sensitive     TRIMETH/SULFA <=20 SENSITIVE Sensitive     PIP/TAZO <=4 SENSITIVE Sensitive     * ESCHERICHIA COLI  MRSA PCR Screening     Status: None   Collection Time: 03/08/14  6:36 PM  Result Value Ref Range Status   MRSA by PCR NEGATIVE NEGATIVE Final    Comment:        The GeneXpert MRSA Assay (FDA approved for NASAL specimens only), is one component of a comprehensive MRSA colonization surveillance program. It is not intended to diagnose MRSA infection nor to guide or monitor treatment for MRSA infections.   Culture, sputum-assessment      Status: None   Collection Time: 03/09/14 12:41 PM  Result Value Ref Range Status   Specimen Description SPUTUM  Final   Special Requests NONE  Final   Sputum evaluation   Final    THIS SPECIMEN IS ACCEPTABLE. RESPIRATORY CULTURE REPORT TO FOLLOW.   Report Status 03/09/2014  FINAL  Final  Culture, respiratory (NON-Expectorated)     Status: None (Preliminary result)   Collection Time: 03/09/14 12:41 PM  Result Value Ref Range Status   Specimen Description SPUTUM  Final   Special Requests NONE  Final   Gram Stain   Final    FEW WBC PRESENT,BOTH PMN AND MONONUCLEAR RARE SQUAMOUS EPITHELIAL CELLS PRESENT NO ORGANISMS SEEN Performed at [facility]    Culture NO GROWTH Performed at [facility]   Final   Report Status PENDING  Incomplete    Anti-infectives    Start     Dose/Rate Route Frequency Ordered Stop   03/10/14 0600  vancomycin (VANCOCIN) IVPB 1000 mg/200 mL premix  Status:  Discontinued     1,000 mg 200 mL/hr over 60 Minutes Intravenous Every 48 hours 03/08/14 0641 03/10/14 1339   03/09/14 0600  ceFEPIme (MAXIPIME) 500 mg in dextrose 5 % 50 mL IVPB     500 mg 100 mL/hr over 30 Minutes Intravenous Every 24 hours 03/08/14 0641     03/08/14 0415  vancomycin (VANCOCIN) IVPB 1000 mg/200 mL premix     1,000 mg 200 mL/hr over 60 Minutes Intravenous  Once 03/08/14 0401 03/08/14 0647   03/08/14 0400  ceFEPIme (MAXIPIME) 1 g in dextrose 5 % 50 mL IVPB     1 g 100 mL/hr over 30 Minutes Intravenous  Once 03/08/14 0355 03/08/14 0520      Assessment: 63 yo female with 1/2 blood cx growing E-coli sensitive to cefepime and also with UTI is currently on cefepime.  SCr a little better to 1.76 (CrCl ~29)  Goal of Therapy:  Resolution of infection  Plan:  Cefepime 500 mg IV q24h Trend WBC, temp, renal function   Nickia Boesen, Tsz-Yin 03/11/2014,10:50 AM

## 2014-03-11 NOTE — Progress Notes (Signed)
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 In the ED, temperature 103.2, tachycardic up to 140s, tachypnea in the 30s, chest x-ray with pneumonia and urine microscopy suggestive of UTI. Admitted for sepsis secondary to pneumonia and UTI, acute renal failure. Admitting M.D. & this MD discussed at length with patients family-patient is DO NOT RESUSCITATE but they would like to continue other aggressive treatment including IV fluids, antibiotics and BiPAP if needed.    Assessment/Plan:  1. Severe sepsis secondary to HCAP, UTI & gram-negative rod bacteremia: Continue IV cefepime. IV fluids. Hemodynamically stable. -Discontinue vancomycin 2. Gram-negative rod bacteremia--Escherichia coli: 1 of 2 blood cultures from 2/18 shows gram-negative rods. Secondary to UTI. Discontinue cefepime, start ciprofloxacin po 3. LLL HCAP: IV vancomycin and cefepime. -Sputum Gram culture= flora 4. Recurrent urinary tract infection/Escherichia coli: IV cefepime-->po ciprofloxacin 5. Acute renal failure/solitary kidney: Creatinine 02/13/14:0.78. Secondary to dehydration, sepsis, diuretics, NSAID's and ACEI. Hold culprit medications. Treat sepsis. IV fluids and follow BMP. Improving, creatinine 3.07 > 2.52>>1.88>>1.76 -Saline lock Fluids, advance diet  6. Acute on chronic hypoxic respiratory failure: Secondary to pneumonia and COPD exacerbation complicating underlying end-stage COPD & OSA. IV antibiotics, bronchodilators, IV Solu-Medrol and oxygen. Acute respiratory failure resolved -Presently stable on 2 L nasal cannula  which is the patient's baseline requirement at home  7. COPD exacerbation: Precipitated by pneumonia. Management as above. Reduce and continue IV Solu-Medrol for now as she continues to have some wheezing .  -03/11/14--discontinue IV Cipro and Medrol, start prednisone po 8. Acute encephalopathy: Secondary to acute medical illness as above complicating underlying bipolar disorder and dementia. No focal deficits. Treated underlying cause and monitor. Mental status back to baseline. Start regular diet. 9. Bipolar disorder: Patient on multiple psychiatric medications as outpatient. Continue same and monitor. Stable. 10. Anemia: Hemoglobin is dropped from 9.9 > 8.2. Likely dilutional. No reported bleeding. Follow CBC in a.m. and transfuse if hemoglobin <7 g per DL. 11. Extremely hard of hearing -Patient has cochlear implant 12. Possible dementia 13. OSA: Continue nightly sleep apnea 14. Essential hypertension: Blood pressures starting to creep up. Hold ACEI.  Resume amlodipine home dose. -Blood pressure acceptable 15. Hypothyroid: Continue Synthroid.   Code Status: DO NOT RESUSCITATE. Family Communication: Discussed with patient's spouse and daughterat bedside Disposition Plan: Home 03/12/14 if stable   Procedures/Studies: Dg Chest 2 View  02/10/2014   CLINICAL DATA:  Fever, COPD  EXAM: CHEST  2 VIEW  COMPARISON:  03/30/2011  FINDINGS: Cardiomediastinal silhouette is stable. Mild hyperinflation. No acute infiltrate or pleural effusion. No pulmonary edema. Mild degenerative changes thoracic spine and bilateral shoulders.  IMPRESSION: No active disease. Mild hyperinflation. Degenerative changes thoracic spine and bilateral shoulders.   Electronically Signed   By: Lahoma Crocker M.D.   On: 02/10/2014 19:04   Dg Chest Port 1 View  03/08/2014   CLINICAL DATA:  Dyspnea  EXAM: PORTABLE CHEST - 1 VIEW  COMPARISON:  02/10/2014  FINDINGS: There is left base consolidation, new from  02/10/2014. This could represent pneumonia. The right lung is clear. No large effusions are evident. Pulmonary vasculature is normal. Heart size is unchanged.  IMPRESSION: New left base consolidation. This could represent pneumonia. Follow-up radiography recommended to confirm complete clearing after treatment.   Electronically Signed   By: Andreas Newport M.D.   On: 03/08/2014 03:38         Subjective: Patient denies fevers, chills, headache, chest pain, dyspnea, nausea, vomiting, diarrhea, abdominal pain, dysuria, hematuria   Objective: Filed Vitals:   03/11/14 0630 03/11/14 0754 03/11/14 1421 03/11/14 1533  BP: 177/95   181/98  Pulse: 94   103  Temp: 98.4 F (36.9 C)   98.5 F (36.9 C)  TempSrc: Oral   Oral  Resp:    18  Height:      Weight:      SpO2: 100% 98% 96% 99%    Intake/Output Summary (Last 24 hours) at 03/11/14 1806 Last data filed at 03/11/14 0601  Gross per 24 hour  Intake    210 ml  Output      1 ml  Net    209 ml   Weight change:  Exam:   General:  Pt is alert, follows commands appropriately, not in acute distress  HEENT: No icterus, No thrush,  [place]/AT  Cardiovascular: RRR, S1/S2, no rubs, no gallops  Respiratory: Left greater than right basilar rales. No wheezing. Good air movement.  Abdomen: Soft/+BS, non tender, non distended, no guarding  Extremities: 1+LE edema, No lymphangitis, No petechiae, No rashes, no synovitis  Data Reviewed: Basic Metabolic Panel:  Recent Labs Lab 03/08/14 0301 03/09/14 0515 03/10/14 0353 03/11/14 0537  NA 133* 133* 134* 136  K 4.4 4.5 4.2 3.8  CL 98 104 106 107  CO2 24 22 22 22   GLUCOSE 127* 98 143* 105*  BUN 36* 37* 33* 29*  CREATININE 3.07* 2.52* 1.88* 1.76*  CALCIUM 8.7 7.7* 7.6* 8.2*   Liver Function Tests:  Recent Labs Lab 03/08/14 0301  AST 29  ALT 15  ALKPHOS 76  BILITOT 0.6  PROT 6.4  ALBUMIN 2.9*   No results for input(s): LIPASE, AMYLASE in the last 168 hours. No results for  input(s): AMMONIA in the last 168 hours. CBC:  Recent Labs Lab 03/08/14 0301 03/09/14 0515 03/10/14 0353 03/11/14 0537  WBC 11.7* 12.1* 10.1 9.3  NEUTROABS 10.5*  --   --   --   HGB 9.9* 8.2* 8.2* 9.1*  HCT 28.9* 25.0* 24.6* 27.6*  MCV 87.0 88.3 87.9 88.7  PLT 359 307 319 322   Cardiac Enzymes: No results for input(s): CKTOTAL, CKMB, CKMBINDEX, TROPONINI in the last 168 hours. BNP: Invalid input(s): POCBNP CBG: No results for input(s): GLUCAP in the last 168 hours.  Recent Results (from the past 240 hour(s))  Blood Culture (routine x 2)     Status: None   Collection Time: 03/08/14  3:10 AM  Result Value Ref Range Status   Specimen Description BLOOD LEFT ARM  Final   Special Requests BOTTLES DRAWN AEROBIC AND ANAEROBIC 10CC  Final   Culture   Final    ESCHERICHIA COLI Note: Gram Stain Report Called to,Read Back By and Verified With: MIKAYLA INGRAM ON 2.18.2016 AT 11:06P BY WILEJ Performed at [facility]    Report Status 03/11/2014 FINAL  Final   Organism ID, Bacteria ESCHERICHIA COLI  Final      Susceptibility   Escherichia coli - MIC*    AMPICILLIN 4 SENSITIVE Sensitive     AMPICILLIN/SULBACTAM <=2 SENSITIVE Sensitive     CEFAZOLIN <=4 SENSITIVE Sensitive     CEFEPIME <=1 SENSITIVE Sensitive  CEFTAZIDIME <=1 SENSITIVE Sensitive     CEFTRIAXONE <=1 SENSITIVE Sensitive     CIPROFLOXACIN <=0.25 SENSITIVE Sensitive     GENTAMICIN <=1 SENSITIVE Sensitive     IMIPENEM <=0.25 SENSITIVE Sensitive     PIP/TAZO <=4 SENSITIVE Sensitive     TOBRAMYCIN <=1 SENSITIVE Sensitive     TRIMETH/SULFA <=20 SENSITIVE Sensitive     * ESCHERICHIA COLI  Blood Culture (routine x 2)     Status: None (Preliminary result)   Collection Time: 03/08/14  3:16 AM  Result Value Ref Range Status   Specimen Description BLOOD LEFT FOREARM  Final   Special Requests BOTTLES DRAWN AEROBIC AND ANAEROBIC 5CC  Final   Culture   Final           BLOOD CULTURE RECEIVED NO GROWTH TO DATE CULTURE  WILL BE HELD FOR 5 DAYS BEFORE ISSUING A FINAL NEGATIVE REPORT Performed at [facility]    Report Status PENDING  Incomplete  Urine culture     Status: None   Collection Time: 03/08/14  3:25 AM  Result Value Ref Range Status   Specimen Description URINE, CATHETERIZED  Final   Special Requests NONE  Final   Colony Count   Final    80,000 COLONIES/ML Performed at [facility]    Culture   Final    ESCHERICHIA COLI Performed at [facility]    Report Status 03/10/2014 FINAL  Final   Organism ID, Bacteria ESCHERICHIA COLI  Final      Susceptibility   Escherichia coli - MIC*    AMPICILLIN 4 SENSITIVE Sensitive     CEFAZOLIN <=4 SENSITIVE Sensitive     CEFTRIAXONE <=1 SENSITIVE Sensitive     CIPROFLOXACIN <=0.25 SENSITIVE Sensitive     GENTAMICIN <=1 SENSITIVE Sensitive     LEVOFLOXACIN <=0.12 SENSITIVE Sensitive     NITROFURANTOIN <=16 SENSITIVE Sensitive     TOBRAMYCIN <=1 SENSITIVE Sensitive     TRIMETH/SULFA <=20 SENSITIVE Sensitive     PIP/TAZO <=4 SENSITIVE Sensitive     * ESCHERICHIA COLI  MRSA PCR Screening     Status: None   Collection Time: 03/08/14  6:36 PM  Result Value Ref Range Status   MRSA by PCR NEGATIVE NEGATIVE Final    Comment:        The GeneXpert MRSA Assay (FDA approved for NASAL specimens only), is one component of a comprehensive MRSA colonization surveillance program. It is not intended to diagnose MRSA infection nor to guide or monitor treatment for MRSA infections.   Culture, sputum-assessment     Status: None   Collection Time: 03/09/14 12:41 PM  Result Value Ref Range Status   Specimen Description SPUTUM  Final   Special Requests NONE  Final   Sputum evaluation   Final    THIS SPECIMEN IS ACCEPTABLE. RESPIRATORY CULTURE REPORT TO FOLLOW.   Report Status 03/09/2014 FINAL  Final  Culture, respiratory (NON-Expectorated)     Status: None (Preliminary result)   Collection Time: 03/09/14 12:41 PM  Result Value Ref  Range Status   Specimen Description SPUTUM  Final   Special Requests NONE  Final   Gram Stain   Final    FEW WBC PRESENT,BOTH PMN AND MONONUCLEAR RARE SQUAMOUS EPITHELIAL CELLS PRESENT NO ORGANISMS SEEN Performed at [facility]    Culture   Final    NORMAL OROPHARYNGEAL FLORA Performed at [facility]    Report Status PENDING  Incomplete     Scheduled Meds: . [  START ON 03/12/2014] amLODipine  10 mg Oral Daily  . ARIPiprazole  20 mg Oral QHS  . atorvastatin  10 mg Oral q1800  . buPROPion  300 mg Oral Daily  . busPIRone  15 mg Oral BID  . ceFEPime (MAXIPIME) IV  500 mg Intravenous Q24H  . ciprofloxacin  500 mg Oral q1800  . fluticasone  1 spray Each Nare BID  . heparin  5,000 Units Subcutaneous 3 times per day  . ipratropium-albuterol  3 mL Nebulization TID  . levothyroxine  100 mcg Oral QAC breakfast  . mometasone-formoterol  2 puff Inhalation BID  . nicotine  7 mg Transdermal Daily  . oxcarbazepine  600 mg Oral BID  . [START ON 03/12/2014] predniSONE  40 mg Oral Q breakfast  . sodium chloride  3 mL Intravenous Q12H  . tamsulosin  0.4 mg Oral Daily  . traZODone  150 mg Oral QHS   Continuous Infusions:    Solangel Mcmanaway, DO  [facility] Pager [phone]  If 7PM-7AM, please contact night-coverage [email or web address] Password [email or web address] 03/11/2014, 6:06 PM   LOS: 3 days

## 2014-03-11 NOTE — Progress Notes (Signed)
Placed patient on CPAP via [facility], 8.0 cm H20 per home settings with 3 lpm O2 bleed in.  Patient tolerating well at this time.

## 2014-03-12 ENCOUNTER — Telehealth: Payer: Self-pay

## 2014-03-12 LAB — CULTURE, RESPIRATORY

## 2014-03-12 LAB — BASIC METABOLIC PANEL
ANION GAP: 4 — AB (ref 5–15)
BUN: 27 mg/dL — AB (ref 6–23)
CHLORIDE: 106 mmol/L (ref 96–112)
CO2: 25 mmol/L (ref 19–32)
Calcium: 8.2 mg/dL — ABNORMAL LOW (ref 8.4–10.5)
Creatinine, Ser: 1.82 mg/dL — ABNORMAL HIGH (ref 0.50–1.10)
GFR, EST AFRICAN AMERICAN: 33 mL/min — AB (ref >90)
GFR, EST NON AFRICAN AMERICAN: 28 mL/min — AB (ref >90)
GLUCOSE: 99 mg/dL (ref 70–99)
POTASSIUM: 3.8 mmol/L (ref 3.5–5.1)
Sodium: 135 mmol/L (ref 135–145)

## 2014-03-12 LAB — CULTURE, RESPIRATORY W GRAM STAIN: Culture: NORMAL

## 2014-03-12 MED ORDER — CIPROFLOXACIN HCL 500 MG PO TABS: 500.0000 mg | ORAL_TABLET | Freq: Every day | ORAL | Status: DC

## 2014-03-12 MED ORDER — PREDNISONE 10 MG PO TABS: 40.0000 mg | ORAL_TABLET | Freq: Every day | ORAL | Status: DC

## 2014-03-12 NOTE — Telephone Encounter (Signed)
Called and spoken to [place] with [facility]. Notified him of Dr Marliss Coots comments.

## 2014-03-12 NOTE — Care Management Note (Signed)
    Page 1 of 1   03/12/2014     2:24:58 PM CARE MANAGEMENT NOTE 03/12/2014  Patient:  Martha Patton, Martha Patton   Account Number:  [id number]  Date Initiated:  03/09/2014  Documentation initiated by:  MAYO,HENRIETTA  Subjective/Objective Assessment:   dx sepsis; lives with spouse- has home oxygen with [employer], spouse will bring patients tank to her room before dc.    PCP  [facility]     Action/Plan:   pt eval- hhpt   Anticipated DC Date:  03/12/2014   Anticipated DC Plan:  [place]  CM consult      Choice offered to / List presented to:          [facility] arranged  HH-2 PT      [place].   Status of service:  Completed, signed off Medicare Important Message given?  YES (If response is "NO", the following Medicare IM given date fields will be blank) Date Medicare IM given:  03/12/2014 Medicare IM given by:  Tomi Bamberger Date Additional Medicare IM given:   Additional Medicare IM given by:    Discharge Disposition:  [place]  Per UR Regulation:  Reviewed for med. necessity/level of care/duration of stay  If discussed at [place] of Stay Meetings, dates discussed:    Comments:  03/12/14 [place], BSN [phone] patient is for dc today, she chose [facility] for HHPT, referral made to [facility], [place] notified.  Soc will begin 24-48 hrs post dc.

## 2014-03-12 NOTE — Discharge Summary (Addendum)
Physician Discharge Summary  ZOHAL RENY QQI:[id number] DOB: 1950-09-22 DOA: 03/08/2014  PCP: Loura Pardon, MD  Admit date: 03/08/2014 Discharge date: 03/12/2014  Recommendations for Outpatient Follow-up:  1. Pt will need to follow up with PCP in 2 weeks post discharge 2. Please obtain BMP and CBC in one week Discharge Diagnoses:  1. Severe sepsis secondary to HCAP, UTI & gram-negative rod bacteremia: Continued IV cefepime. IV fluids. Hemodynamically stable. -Discontinue vancomycin 2. Gram-negative rod bacteremia--Escherichia coli: 1 of 2 blood cultures from 2/18 shows gram-negative rods. Secondary to UTI. Discontinue cefepime, start ciprofloxacin po--home with 10 days to finish 14 days total of therapy 3. LLL HCAP: IV vancomycin and cefepime. -Sputum Gram culture= flora 4. Recurrent urinary tract infection/Escherichia coli: IV cefepime-->po ciprofloxacin--as above 5. Acute renal failure/solitary kidney: Creatinine 02/13/14:0.78. Secondary to dehydration, sepsis, diuretics, NSAID's and ACEI. Hold culprit medications. Treat sepsis. IV fluids and follow BMP. Improving, creatinine 3.07 > 2.52>>1.88>>1.76 -Saline lock Fluids, advance diet   -d/c Lotrel  -d/c benazepril  -home with amlodipine 10mg  daily  -avoid NSAIDS 6. Acute on chronic hypoxic respiratory failure: Secondary to pneumonia and COPD exacerbation complicating underlying end-stage COPD & OSA. IV antibiotics, bronchodilators, IV Solu-Medrol and oxygen. Acute respiratory failure resolved -Presently stable on 2 L nasal cannula which is the patient's baseline requirement at home  7. COPD exacerbation: Precipitated by pneumonia. Management as above. Reduce and continue IV Solu-Medrol for now as she continues to have some wheezing . -03/11/14--discontinue IV Cipro and Medrol, start prednisone po  -home with prednisone taper--40mg >>30>>20>>10 8. Acute encephalopathy: Secondary to acute  medical illness as above complicating underlying bipolar disorder and dementia. No focal deficits. Treated underlying cause and monitor. Mental status back to baseline. Start regular diet. 9. Bipolar disorder: Patient on multiple psychiatric medications as outpatient. Continue same and monitor. Stable. 10. Anemia: Hemoglobin is dropped from 9.9 > 8.2. Likely dilutional. No reported bleeding. Follow CBC in a.m. and transfuse if hemoglobin <7 g per DL. 11. Extremely hard of hearing -Patient has cochlear implant 12. Possible dementia--outpt followup 13. OSA: Continue nightly sleep apnea 14. Essential hypertension: Blood pressures starting to creep up. Hold ACEI. Resume amlodipine home dose. -Blood pressure acceptable 15. Hypothyroid: Continue Synthroid.  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 In the ED, temperature 103.2, tachycardic up to 140s, tachypnea in the 30s, chest x-ray with pneumonia and urine microscopy suggestive of UTI. Admitted for sepsis secondary to pneumonia and UTI, acute renal failure. Admitting M.D. & this MD discussed at length with patients family-patient is DO NOT RESUSCITATE but they would like to continue other aggressive treatment including IV fluids, antibiotics and BiPAP if needed. The patient was treated with intravenous antibiotics. Urine and Blood cultures grew Escherichia coli. Antibiotics were adjusted accordingly based on susceptibilities. The patient clinically  improved. She was transferred to the regular medical floor. The patient was transitioned to oral ciprofloxacin which she tolerated. The patient's oxygen was weaned back to her usual home 2 L. The patient will be discharged with 10  additional days of ciprofloxacin to finish 14 days therapy. Home health physical therapy was set up due to her deconditioning.    Discharge Exam: Filed Vitals:   03/12/14 0509  BP: 151/90  Pulse: 81  Temp: 98 F (36.7 C)  Resp: 20   Filed Vitals:   03/11/14 2153 03/11/14 2339 03/12/14 0509 03/12/14 0746  BP: 178/98  151/90   Pulse: 104 102 81   Temp: 99.1 F (37.3 C)  98 F (36.7 C)   TempSrc: Oral  Oral   Resp: 20 20 20    Height:      Weight:      SpO2: 99% 98% 98% 98%   General: A&O x 3, NAD, pleasant, cooperative Cardiovascular: RRR, no rub, no gallop, no S3 Respiratory: Scattered rales. No wheeze. Abdomen:soft, nontender, nondistended, positive bowel sounds Extremities: No edema, No lymphangitis, no petechiae  Discharge Instructions     Medication List    STOP taking these medications        amLODipine-benazepril 10-20 MG per capsule  Commonly known as:  LOTREL     benazepril 20 MG tablet  Commonly known as:  LOTENSIN     furosemide 40 MG tablet  Commonly known as:  LASIX     ibuprofen 200 MG tablet  Commonly known as:  ADVIL,MOTRIN     potassium chloride 10 MEQ tablet  Commonly known as:  KLOR-CON 10      TAKE these medications        ADVAIR DISKUS 250-50 MCG/DOSE Aepb  Generic drug:  Fluticasone-Salmeterol  Inhale 2 puffs into the lungs 2 (two) times daily.     albuterol 90 MCG/ACT inhaler  Commonly known as:  PROVENTIL,VENTOLIN  Inhale 2 puffs into the lungs every 6 (six) hours as needed. For shortness of breath     amLODipine 10 MG tablet  Commonly known as:  NORVASC  Take 1 tablet (10 mg total) by mouth daily.     ARIPiprazole 20 MG tablet  Commonly known as:  ABILIFY  Take 20 mg by mouth at bedtime.      atorvastatin 10 MG tablet  Commonly known as:  LIPITOR  TAKE 1 TABLET (10 MG TOTAL) BY MOUTH DAILY.     buPROPion 300 MG 24 hr tablet  Commonly known as:  WELLBUTRIN XL  Take 300 mg by mouth daily.     busPIRone 15 MG tablet  Commonly known as:  BUSPAR  Take 15 mg by mouth 2 (two) times daily.     CENTRUM SILVER PO  Take 1 tablet by mouth daily.     ciprofloxacin 500 MG tablet  Commonly known as:  CIPRO  Take 1 tablet (500 mg total) by mouth daily.     clonazePAM 0.5 MG tablet  Commonly known as:  KLONOPIN  Take 0.5 mg by mouth 2 (two) times daily as needed. For nerves     FLOMAX 0.4 MG Caps capsule  Generic drug:  tamsulosin  Take 0.4 mg by mouth daily.     fluticasone 50 MCG/ACT nasal spray  Commonly known as:  FLONASE  1 spray by Nasal route 2 (two) times daily.     levothyroxine 100 MCG tablet  Commonly known as:  SYNTHROID, LEVOTHROID  TAKE 1 TABLET (100 MCG TOTAL) BY MOUTH DAILY.     Melatonin 3 MG Tabs  Takes 3-10 mg per night     predniSONE 10 MG tablet  Commonly known as:  DELTASONE  Take 4 tablets (40 mg total) by  mouth daily with breakfast. Decrease by one tablet daily  Start taking on:  03/13/2014     traZODone 100 MG tablet  Commonly known as:  DESYREL  Take 150 mg by mouth at bedtime.     TRILEPTAL 600 MG tablet  Generic drug:  oxcarbazepine  Take 600 mg by mouth 2 (two) times daily.     TUDORZA PRESSAIR 400 MCG/ACT Aepb  Generic drug:  Aclidinium Bromide  Inhale 400 mcg into the lungs every 12 (twelve) hours.         The results of significant diagnostics from this hospitalization (including imaging, microbiology, ancillary and laboratory) are listed below for reference.    Significant Diagnostic Studies: Dg Chest 2 View  02/10/2014   CLINICAL DATA:  Fever, COPD  EXAM: CHEST  2 VIEW  COMPARISON:  03/30/2011  FINDINGS: Cardiomediastinal silhouette is stable. Mild hyperinflation. No acute infiltrate or pleural effusion. No pulmonary edema.  Mild degenerative changes thoracic spine and bilateral shoulders.  IMPRESSION: No active disease. Mild hyperinflation. Degenerative changes thoracic spine and bilateral shoulders.   Electronically Signed   By: Lahoma Crocker M.D.   On: 02/10/2014 19:04   Dg Chest Port 1 View  03/08/2014   CLINICAL DATA:  Dyspnea  EXAM: PORTABLE CHEST - 1 VIEW  COMPARISON:  02/10/2014  FINDINGS: There is left base consolidation, new from 02/10/2014. This could represent pneumonia. The right lung is clear. No large effusions are evident. Pulmonary vasculature is normal. Heart size is unchanged.  IMPRESSION: New left base consolidation. This could represent pneumonia. Follow-up radiography recommended to confirm complete clearing after treatment.   Electronically Signed   By: Andreas Newport M.D.   On: 03/08/2014 03:38     Microbiology: Recent Results (from the past 240 hour(s))  Blood Culture (routine x 2)     Status: None   Collection Time: 03/08/14  3:10 AM  Result Value Ref Range Status   Specimen Description BLOOD LEFT ARM  Final   Special Requests BOTTLES DRAWN AEROBIC AND ANAEROBIC 10CC  Final   Culture   Final    ESCHERICHIA COLI Note: Gram Stain Report Called to,Read Back By and Verified With: MIKAYLA INGRAM ON 2.18.2016 AT 11:06P BY WILEJ Performed at [facility]    Report Status 03/11/2014 FINAL  Final   Organism ID, Bacteria ESCHERICHIA COLI  Final      Susceptibility   Escherichia coli - MIC*    AMPICILLIN 4 SENSITIVE Sensitive     AMPICILLIN/SULBACTAM <=2 SENSITIVE Sensitive     CEFAZOLIN <=4 SENSITIVE Sensitive     CEFEPIME <=1 SENSITIVE Sensitive     CEFTAZIDIME <=1 SENSITIVE Sensitive     CEFTRIAXONE <=1 SENSITIVE Sensitive     CIPROFLOXACIN <=0.25 SENSITIVE Sensitive     GENTAMICIN <=1 SENSITIVE Sensitive     IMIPENEM <=0.25 SENSITIVE Sensitive     PIP/TAZO <=4 SENSITIVE Sensitive     TOBRAMYCIN <=1 SENSITIVE Sensitive     TRIMETH/SULFA <=20 SENSITIVE Sensitive     *  ESCHERICHIA COLI  Blood Culture (routine x 2)     Status: None (Preliminary result)   Collection Time: 03/08/14  3:16 AM  Result Value Ref Range Status   Specimen Description BLOOD LEFT FOREARM  Final   Special Requests BOTTLES DRAWN AEROBIC AND ANAEROBIC 5CC  Final   Culture   Final           BLOOD CULTURE RECEIVED NO GROWTH TO DATE CULTURE WILL BE HELD FOR 5 DAYS BEFORE ISSUING A  FINAL NEGATIVE REPORT Performed at [facility]    Report Status PENDING  Incomplete  Urine culture     Status: None   Collection Time: 03/08/14  3:25 AM  Result Value Ref Range Status   Specimen Description URINE, CATHETERIZED  Final   Special Requests NONE  Final   Colony Count   Final    80,000 COLONIES/ML Performed at [facility]    Culture   Final    ESCHERICHIA COLI Performed at [facility]    Report Status 03/10/2014 FINAL  Final   Organism ID, Bacteria ESCHERICHIA COLI  Final      Susceptibility   Escherichia coli - MIC*    AMPICILLIN 4 SENSITIVE Sensitive     CEFAZOLIN <=4 SENSITIVE Sensitive     CEFTRIAXONE <=1 SENSITIVE Sensitive     CIPROFLOXACIN <=0.25 SENSITIVE Sensitive     GENTAMICIN <=1 SENSITIVE Sensitive     LEVOFLOXACIN <=0.12 SENSITIVE Sensitive     NITROFURANTOIN <=16 SENSITIVE Sensitive     TOBRAMYCIN <=1 SENSITIVE Sensitive     TRIMETH/SULFA <=20 SENSITIVE Sensitive     PIP/TAZO <=4 SENSITIVE Sensitive     * ESCHERICHIA COLI  MRSA PCR Screening     Status: None   Collection Time: 03/08/14  6:36 PM  Result Value Ref Range Status   MRSA by PCR NEGATIVE NEGATIVE Final    Comment:        The GeneXpert MRSA Assay (FDA approved for NASAL specimens only), is one component of a comprehensive MRSA colonization surveillance program. It is not intended to diagnose MRSA infection nor to guide or monitor treatment for MRSA infections.   Culture, sputum-assessment     Status: None   Collection Time: 03/09/14 12:41 PM  Result Value Ref Range Status     Specimen Description SPUTUM  Final   Special Requests NONE  Final   Sputum evaluation   Final    THIS SPECIMEN IS ACCEPTABLE. RESPIRATORY CULTURE REPORT TO FOLLOW.   Report Status 03/09/2014 FINAL  Final  Culture, respiratory (NON-Expectorated)     Status: None (Preliminary result)   Collection Time: 03/09/14 12:41 PM  Result Value Ref Range Status   Specimen Description SPUTUM  Final   Special Requests NONE  Final   Gram Stain   Final    FEW WBC PRESENT,BOTH PMN AND MONONUCLEAR RARE SQUAMOUS EPITHELIAL CELLS PRESENT NO ORGANISMS SEEN Performed at [facility]    Culture   Final    NORMAL OROPHARYNGEAL FLORA Performed at [facility]    Report Status PENDING  Incomplete     Labs: Basic Metabolic Panel:  Recent Labs Lab 03/08/14 0301 03/09/14 0515 03/10/14 0353 03/11/14 0537  NA 133* 133* 134* 136  K 4.4 4.5 4.2 3.8  CL 98 104 106 107  CO2 24 22 22 22   GLUCOSE 127* 98 143* 105*  BUN 36* 37* 33* 29*  CREATININE 3.07* 2.52* 1.88* 1.76*  CALCIUM 8.7 7.7* 7.6* 8.2*   Liver Function Tests:  Recent Labs Lab 03/08/14 0301  AST 29  ALT 15  ALKPHOS 76  BILITOT 0.6  PROT 6.4  ALBUMIN 2.9*   No results for input(s): LIPASE, AMYLASE in the last 168 hours. No results for input(s): AMMONIA in the last 168 hours. CBC:  Recent Labs Lab 03/08/14 0301 03/09/14 0515 03/10/14 0353 03/11/14 0537  WBC 11.7* 12.1* 10.1 9.3  NEUTROABS 10.5*  --   --   --   HGB 9.9* 8.2* 8.2*  9.1*  HCT 28.9* 25.0* 24.6* 27.6*  MCV 87.0 88.3 87.9 88.7  PLT 359 307 319 322   Cardiac Enzymes: No results for input(s): CKTOTAL, CKMB, CKMBINDEX, TROPONINI in the last 168 hours. BNP: Invalid input(s): POCBNP CBG: No results for input(s): GLUCAP in the last 168 hours.  Time coordinating discharge:  Greater than 30 minutes  Signed:  Kloi Brodman, DO [facility] Pager: [phone] 03/12/2014, 9:10 AM

## 2014-03-12 NOTE — Telephone Encounter (Signed)
Martha Patton with [facility] left v/m requesting cb to confirm that Dr Glori Bickers will be the attending for home health orders since discharge from [facility].

## 2014-03-12 NOTE — Telephone Encounter (Signed)
Yes - thank you

## 2014-03-12 NOTE — Progress Notes (Signed)
Physical Therapy Treatment Patient Details Name: Martha Patton MRN: [id number] DOB: 1950/07/22 Today's Date: 03-17-2014    History of Present Illness Patient is a 64 y/o female admitted with urosepsis and pneumonia.  PMH positive for bipolar d/c, STM loss, end stage COPD on home O2, HOH, recurrent UTI, HTN, HLD, OSA on C-pap.    PT Comments    Patient anxious to go home.  Will have f/u HHPT at d/c.  Follow Up Recommendations  Home health PT;Supervision/Assistance - 24 hour [facility] Aide)     Equipment Recommendations  None recommended by PT    Recommendations for Other Services       Precautions / Restrictions Precautions Precautions: Fall Restrictions Weight Bearing Restrictions: No    Mobility  Bed Mobility Overal bed mobility: Needs Assistance Bed Mobility: Supine to Sit     Supine to sit: Supervision;HOB elevated     General bed mobility comments: Supervision for safety only  Transfers Overall transfer level: Needs assistance Equipment used: None Transfers: Sit to/from [facility] Sit to Stand: Min guard Stand pivot transfers: Min guard       General transfer comment: Verbal cues for technique and safety.  Assist for safety.    Ambulation/Gait             General Gait Details: Patient declined ambulation - focused on going home.  Agreed to exercises.   Stairs            Wheelchair Mobility    Modified Rankin (Stroke Patients Only)       Balance                                    Cognition Arousal/Alertness: Awake/alert Behavior During Therapy: Restless Overall Cognitive Status: Within Functional Limits for tasks assessed                      Exercises General Exercises - Upper Extremity Shoulder Flexion: AROM;Both;10 reps;Seated Shoulder Horizontal ABduction: AROM;Both;10 reps;Seated Shoulder Horizontal ADduction: AROM;Both;10 reps;Seated Elbow Flexion: AROM;Both;10 reps;Seated General  Exercises - Lower Extremity Ankle Circles/Pumps: AROM;Both;10 reps;Seated Long Arc Quad: AROM;Both;10 reps;Seated Hip ABduction/ADduction: AROM;Both;10 reps;Seated Hip Flexion/Marching: AROM;Both;10 reps;Seated Toe Raises: AROM;Both;10 reps;Seated Heel Raises: AROM;Both;10 reps;Seated    General Comments        Pertinent Vitals/Pain Pain Assessment: No/denies pain    Home Living                      Prior Function            PT Goals (current goals can now be found in the care plan section) Progress towards PT goals: Progressing toward goals    Frequency  Min 3X/week    PT Plan Current plan remains appropriate    Co-evaluation             End of Session Equipment Utilized During Treatment: Gait belt;Oxygen Activity Tolerance: Patient tolerated treatment well Patient left: in chair;with call bell/phone within reach;with family/visitor present     Time: 3710-6269 PT Time Calculation (min) (ACUTE ONLY): 14 min  Charges:  $Therapeutic Activity: 8-22 mins                    G Codes:      Despina Pole 03-17-2014, 11:48 AM Carita Pian. Sanjuana Kava, [place] Pager [phone]

## 2014-03-13 ENCOUNTER — Telehealth: Payer: Self-pay | Admitting: Family Medicine

## 2014-03-13 NOTE — Telephone Encounter (Signed)
Patient was discharged from [facility] on 03/12/14. HH PT was ordered only for this patient when she left the hospital. [facility] is saying that she needs an order for Skilled Nursing also in order for [facility] PT to go out to the home. Jacqlyn Larsen can take a verbal order now to get the patient admitted b/c she is very much in need of them coming. They will need an order also faxed to them to Fax# [phone] for the [place]. Call [place] back at [phone] with your verbal order.

## 2014-03-13 NOTE — Telephone Encounter (Signed)
Called and spoken to [place]. Gave the verbal order. Faxed the px to [phone] as instructed.

## 2014-03-13 NOTE — Telephone Encounter (Signed)
Please give the verbal order and I hand wrote something to fax in IN box  Thanks

## 2014-03-14 ENCOUNTER — Telehealth: Payer: Self-pay | Admitting: *Deleted

## 2014-03-14 LAB — CULTURE, BLOOD (ROUTINE X 2): CULTURE: NO GROWTH

## 2014-03-14 NOTE — Telephone Encounter (Signed)
Spoken to patient's husband. He stated that patient is doing ok. They already schedule appt on 02/2914 to see Dr Glori Bickers.

## 2014-03-19 ENCOUNTER — Inpatient Hospital Stay (HOSPITAL_COMMUNITY)
Admission: EM | Admit: 2014-03-19 | Discharge: 2014-03-23 | DRG: 660 | Disposition: A | Discharge status: Skilled Nursing Facility | Payer: Medicare Other | Attending: Internal Medicine | Admitting: Internal Medicine

## 2014-03-19 ENCOUNTER — Encounter (HOSPITAL_COMMUNITY): Payer: Self-pay | Admitting: *Deleted

## 2014-03-19 ENCOUNTER — Ambulatory Visit (INDEPENDENT_AMBULATORY_CARE_PROVIDER_SITE_OTHER): Payer: Medicare Other | Admitting: Family Medicine

## 2014-03-19 ENCOUNTER — Telehealth: Payer: Self-pay | Admitting: Radiology

## 2014-03-19 ENCOUNTER — Encounter: Payer: Self-pay | Admitting: Family Medicine

## 2014-03-19 VITALS — BP 148/90 | HR 84 | Temp 98.4°F | Ht 60.0 in | Wt 153.8 lb

## 2014-03-19 DIAGNOSIS — N179 Acute kidney failure, unspecified: Secondary | ICD-10-CM

## 2014-03-19 DIAGNOSIS — Z9049 Acquired absence of other specified parts of digestive tract: Secondary | ICD-10-CM | POA: Diagnosis present

## 2014-03-19 DIAGNOSIS — I10 Essential (primary) hypertension: Secondary | ICD-10-CM

## 2014-03-19 DIAGNOSIS — N138 Other obstructive and reflux uropathy: Secondary | ICD-10-CM

## 2014-03-19 DIAGNOSIS — N390 Urinary tract infection, site not specified: Secondary | ICD-10-CM | POA: Diagnosis present

## 2014-03-19 DIAGNOSIS — Z90710 Acquired absence of both cervix and uterus: Secondary | ICD-10-CM

## 2014-03-19 DIAGNOSIS — R591 Generalized enlarged lymph nodes: Secondary | ICD-10-CM | POA: Diagnosis present

## 2014-03-19 DIAGNOSIS — N131 Hydronephrosis with ureteral stricture, not elsewhere classified: Secondary | ICD-10-CM | POA: Diagnosis present

## 2014-03-19 DIAGNOSIS — E785 Hyperlipidemia, unspecified: Secondary | ICD-10-CM | POA: Diagnosis present

## 2014-03-19 DIAGNOSIS — Z905 Acquired absence of kidney: Secondary | ICD-10-CM | POA: Diagnosis present

## 2014-03-19 DIAGNOSIS — M1990 Unspecified osteoarthritis, unspecified site: Secondary | ICD-10-CM | POA: Diagnosis present

## 2014-03-19 DIAGNOSIS — N3289 Other specified disorders of bladder: Secondary | ICD-10-CM | POA: Diagnosis present

## 2014-03-19 DIAGNOSIS — H3530 Unspecified macular degeneration: Secondary | ICD-10-CM | POA: Diagnosis present

## 2014-03-19 DIAGNOSIS — F17210 Nicotine dependence, cigarettes, uncomplicated: Secondary | ICD-10-CM | POA: Diagnosis present

## 2014-03-19 DIAGNOSIS — F319 Bipolar disorder, unspecified: Secondary | ICD-10-CM | POA: Diagnosis present

## 2014-03-19 DIAGNOSIS — Z888 Allergy status to other drugs, medicaments and biological substances status: Secondary | ICD-10-CM

## 2014-03-19 DIAGNOSIS — J9621 Acute and chronic respiratory failure with hypoxia: Secondary | ICD-10-CM

## 2014-03-19 DIAGNOSIS — N19 Unspecified kidney failure: Secondary | ICD-10-CM

## 2014-03-19 DIAGNOSIS — Z66 Do not resuscitate: Secondary | ICD-10-CM | POA: Diagnosis present

## 2014-03-19 DIAGNOSIS — Z85528 Personal history of other malignant neoplasm of kidney: Secondary | ICD-10-CM

## 2014-03-19 DIAGNOSIS — N1330 Unspecified hydronephrosis: Secondary | ICD-10-CM

## 2014-03-19 DIAGNOSIS — IMO0002 Reserved for concepts with insufficient information to code with codable children: Secondary | ICD-10-CM

## 2014-03-19 DIAGNOSIS — B9620 Unspecified Escherichia coli [E. coli] as the cause of diseases classified elsewhere: Secondary | ICD-10-CM | POA: Diagnosis present

## 2014-03-19 DIAGNOSIS — E079 Disorder of thyroid, unspecified: Secondary | ICD-10-CM | POA: Diagnosis present

## 2014-03-19 DIAGNOSIS — Z8551 Personal history of malignant neoplasm of bladder: Secondary | ICD-10-CM

## 2014-03-19 DIAGNOSIS — Q600 Renal agenesis, unilateral: Secondary | ICD-10-CM

## 2014-03-19 DIAGNOSIS — N1339 Other hydronephrosis: Secondary | ICD-10-CM | POA: Diagnosis present

## 2014-03-19 DIAGNOSIS — Z91048 Other nonmedicinal substance allergy status: Secondary | ICD-10-CM

## 2014-03-19 DIAGNOSIS — R590 Localized enlarged lymph nodes: Secondary | ICD-10-CM | POA: Diagnosis present

## 2014-03-19 DIAGNOSIS — G4733 Obstructive sleep apnea (adult) (pediatric): Secondary | ICD-10-CM | POA: Diagnosis present

## 2014-03-19 DIAGNOSIS — J449 Chronic obstructive pulmonary disease, unspecified: Secondary | ICD-10-CM | POA: Diagnosis present

## 2014-03-19 DIAGNOSIS — J189 Pneumonia, unspecified organism: Secondary | ICD-10-CM

## 2014-03-19 DIAGNOSIS — E039 Hypothyroidism, unspecified: Secondary | ICD-10-CM | POA: Diagnosis present

## 2014-03-19 DIAGNOSIS — N329 Bladder disorder, unspecified: Secondary | ICD-10-CM | POA: Diagnosis present

## 2014-03-19 DIAGNOSIS — H9190 Unspecified hearing loss, unspecified ear: Secondary | ICD-10-CM | POA: Diagnosis present

## 2014-03-19 DIAGNOSIS — R600 Localized edema: Secondary | ICD-10-CM | POA: Diagnosis present

## 2014-03-19 DIAGNOSIS — A419 Sepsis, unspecified organism: Secondary | ICD-10-CM

## 2014-03-19 HISTORY — DX: Other complications of anesthesia, initial encounter: T88.59XA

## 2014-03-19 HISTORY — DX: Adverse effect of unspecified anesthetic, initial encounter: T41.45XA

## 2014-03-19 LAB — CBC WITH DIFFERENTIAL/PLATELET
BASOS ABS: 0 10*3/uL (ref 0.0–0.1)
BASOS PCT: 0.3 % (ref 0.0–3.0)
BASOS PCT: 0 % (ref 0–1)
Basophils Absolute: 0 10*3/uL (ref 0.0–0.1)
EOS ABS: 0.2 10*3/uL (ref 0.0–0.7)
EOS ABS: 0.2 10*3/uL (ref 0.0–0.7)
EOS PCT: 1 % (ref 0–5)
Eosinophils Relative: 1.7 % (ref 0.0–5.0)
HCT: 26.4 % — ABNORMAL LOW (ref 36.0–46.0)
HEMATOCRIT: 26.6 % — AB (ref 36.0–46.0)
Hemoglobin: 9 g/dL — ABNORMAL LOW (ref 12.0–15.0)
Lymphocytes Relative: 4.6 % — ABNORMAL LOW (ref 12.0–46.0)
Lymphocytes Relative: 5 % — ABNORMAL LOW (ref 12–46)
Lymphs Abs: 0.5 10*3/uL — ABNORMAL LOW (ref 0.7–4.0)
Lymphs Abs: 0.6 10*3/uL — ABNORMAL LOW (ref 0.7–4.0)
MCH: 29.6 pg (ref 26.0–34.0)
MCHC: 33.3 g/dL (ref 30.0–36.0)
MCHC: 33.8 g/dL (ref 30.0–36.0)
MCV: 88.3 fl (ref 78.0–100.0)
MCV: 87.5 fL (ref 78.0–100.0)
MONO ABS: 0.5 10*3/uL (ref 0.1–1.0)
MONO ABS: 0.6 10*3/uL (ref 0.1–1.0)
Monocytes Relative: 4.5 % (ref 3.0–12.0)
Monocytes Relative: 5 % (ref 3–12)
NEUTROS ABS: 9.3 10*3/uL — AB (ref 1.4–7.7)
Neutro Abs: 10.7 10*3/uL — ABNORMAL HIGH (ref 1.7–7.7)
Neutrophils Relative %: 88.9 % — ABNORMAL HIGH (ref 43.0–77.0)
Neutrophils Relative %: 89 % — ABNORMAL HIGH (ref 43–77)
Platelets: 426 10*3/uL — ABNORMAL HIGH (ref 150.0–400.0)
Platelets: 421 10*3/uL — ABNORMAL HIGH (ref 150–400)
RBC: 2.99 Mil/uL — ABNORMAL LOW (ref 3.87–5.11)
RBC: 3.04 MIL/uL — ABNORMAL LOW (ref 3.87–5.11)
RDW: 15.3 % (ref 11.5–15.5)
RDW: 14.9 % (ref 11.5–15.5)
WBC: 10.4 10*3/uL (ref 4.0–10.5)
WBC: 12 10*3/uL — ABNORMAL HIGH (ref 4.0–10.5)

## 2014-03-19 LAB — URINALYSIS, ROUTINE W REFLEX MICROSCOPIC
BILIRUBIN URINE: NEGATIVE
Glucose, UA: NEGATIVE mg/dL
Ketones, ur: 15 mg/dL — AB
Nitrite: POSITIVE — AB
Protein, ur: >300 mg/dL — AB
Specific Gravity, Urine: 1.02 (ref 1.005–1.030)
Urobilinogen, UA: 1 mg/dL (ref 0.0–1.0)
pH: 5 (ref 5.0–8.0)

## 2014-03-19 LAB — RENAL FUNCTION PANEL
Albumin: 2.8 g/dL — ABNORMAL LOW (ref 3.5–5.2)
BUN: 56 mg/dL — ABNORMAL HIGH (ref 6–23)
CO2: 22 meq/L (ref 19–32)
CREATININE: 5.7 mg/dL — AB (ref 0.40–1.20)
Calcium: 8.1 mg/dL — ABNORMAL LOW (ref 8.4–10.5)
Chloride: 101 mEq/L (ref 96–112)
GFR: 7.94 mL/min — AB (ref >60.00)
GLUCOSE: 80 mg/dL (ref 70–99)
Phosphorus: 6.2 mg/dL — ABNORMAL HIGH (ref 2.3–4.6)
Potassium: 5 mEq/L (ref 3.5–5.1)
Sodium: 131 mEq/L — ABNORMAL LOW (ref 135–145)

## 2014-03-19 LAB — COMPREHENSIVE METABOLIC PANEL
ALT: 15 U/L (ref 0–35)
ANION GAP: 11 (ref 5–15)
AST: 27 U/L (ref 0–37)
Albumin: 2.7 g/dL — ABNORMAL LOW (ref 3.5–5.2)
Alkaline Phosphatase: 76 U/L (ref 39–117)
BUN: 54 mg/dL — ABNORMAL HIGH (ref 6–23)
CALCIUM: 8 mg/dL — AB (ref 8.4–10.5)
CHLORIDE: 97 mmol/L (ref 96–112)
CO2: 22 mmol/L (ref 19–32)
Creatinine, Ser: 6.01 mg/dL — ABNORMAL HIGH (ref 0.50–1.10)
GFR calc non Af Amer: 7 mL/min — ABNORMAL LOW (ref >90)
GFR, EST AFRICAN AMERICAN: 8 mL/min — AB (ref >90)
Glucose, Bld: 88 mg/dL (ref 70–99)
Potassium: 4.7 mmol/L (ref 3.5–5.1)
Sodium: 130 mmol/L — ABNORMAL LOW (ref 135–145)
TOTAL PROTEIN: 6.2 g/dL (ref 6.0–8.3)
Total Bilirubin: 0.4 mg/dL (ref 0.3–1.2)

## 2014-03-19 LAB — URINE MICROSCOPIC-ADD ON

## 2014-03-19 LAB — LIPASE, BLOOD: Lipase: 70 U/L — ABNORMAL HIGH (ref 11–59)

## 2014-03-19 NOTE — Assessment & Plan Note (Signed)
bp is up off several meds incl ace and lasix from hosp after sepsis with renal failure Lab today  Then will add back medication as tolerated depending on labs

## 2014-03-19 NOTE — ED Notes (Signed)
Pt discharged from hospital 1 week ago for sepsis, UTI, e.coli. Had follow up appointment, creatinine found to be 5. Sent here for further evaluation. No pain. Chills.

## 2014-03-19 NOTE — Telephone Encounter (Signed)
Called her husband -she is still somnolent/groggy and generally not herself Informed of high cr/ kidney failure as well as abnormal electolytes He is taking her to [facility] ER now

## 2014-03-19 NOTE — Assessment & Plan Note (Signed)
Reviewed hosp meds and studies in detail  Lungs are baseline today Has smoked several cigarettes after coming home - counseled on this Is somewhat somnolent today- lab pending

## 2014-03-19 NOTE — Assessment & Plan Note (Signed)
With uti and sepsis in hospital  Improved cr at d/c but not to baseline  Rev records and labs in detail  Renal lab today Will not re start lasix/ace or other held meds until labs return  She is having edema- suspect s/p hydration and w/o lasix

## 2014-03-19 NOTE — Patient Instructions (Addendum)
Labs today  Encourage water intake  Urine specimen if we can- I would like to repeat a culture  No driving until feeling back to normal  Cut back on melatonin- let Dr Bridgett Larsson know  We will likely start some medicine back after labs return (I will be in touch) Go forward with home care and physical therapy and encourage her to use a walker  Encourage not to smoke If symptoms worsen let me know

## 2014-03-19 NOTE — Progress Notes (Signed)
Pre visit review using our clinic review tool, if applicable. No additional management support is needed unless otherwise documented below in the visit note. 

## 2014-03-19 NOTE — Telephone Encounter (Signed)
[facility] called critical lab results, CRT - 5.7. GFR - 7.94, results given to Dr Glori Bickers

## 2014-03-19 NOTE — Assessment & Plan Note (Signed)
Re check urine cx today D/c on cipro after IV abx in hosp (vanco and cefepime) No urinary symptoms  Pt is more somnolent today however (husband suspects from her melatonin)  Rev hospital records and studies in detail  Lab today

## 2014-03-19 NOTE — Assessment & Plan Note (Signed)
Improved in copd pt who still smokes  Rev hosp records in detail  Enc to quit smoking

## 2014-03-19 NOTE — Progress Notes (Signed)
Subjective:    Patient ID: Martha Patton, female    DOB: [birth year], 64 y.o.   MRN: [id number]  HPI Here for f/u of hospitalization from 2/18 to 2/22 for severe sepsis secondary to HCAP and UTI E coli blood and urine cx  Sputum cx neg  tx with vanc/cefepime IV and sent home with cipro  Also had copd eval-tx with bronchodilators and steroids  Also acute renal fail 2nd to uti  Cr was 3.07 and went down to 1/76 at dc  Currently holding lotrel and benazepril and furosemide and K Just on amlodipine for now  bp today is 148/90   She got severe/almost intubated and called in palliative care  She turned around quickly however   She does have a urologist  Pt never gets usual urinary symptoms   Since coming home - is sleepy/tired (today) She is taking melatonin at home - ? If too much  Will see Dr Bridgett Larsson soon - ? If they should cut dose  Balance is not great  They have advanced home health to work with PT (sees a therapist on Wed)   Did roll out of bed Sat night , has had one fall    Patient Active Problem List   Diagnosis Date Noted  . Blood poisoning   . Shortness of breath 03/09/2014  . Weakness 03/09/2014  . Palliative care encounter 03/09/2014  . Severe sepsis with acute organ dysfunction 03/08/2014  . HCAP (healthcare-associated pneumonia) 03/08/2014  . Acute kidney failure 03/08/2014  . Acute on chronic respiratory failure with hypoxia 03/08/2014  . COPD (chronic obstructive pulmonary disease) 03/08/2014  . Chronic respiratory failure 02/11/2014  . Hypokalemia 02/11/2014  . UTI (lower urinary tract infection) 02/11/2014  . Sepsis secondary to UTI 02/10/2014  . AKI (acute kidney injury) 02/10/2014  . Occult blood positive stool 12/18/2013  . Incisional hernia, without obstruction or gangrene 12/15/2013  . E-coli UTI 08/30/2013  . History of renal failure 08/30/2013  . Encounter for Medicare annual wellness exam 12/05/2012  . Right knee pain 05/11/2012  . Left  shoulder pain 05/11/2012  . Right shoulder pain 05/11/2012  . Colon cancer screening 12/01/2011  . HTN (hypertension) 03/30/2011  . Obstructive sleep apnea on CPAP   . Localized swelling, mass and lump, neck 09/01/2010  . Osteopenia 04/29/2009  . LIVER FUNCTION TESTS, ABNORMAL, HX OF 02/13/2009  . RHINITIS 12/12/2008  . HYPOKALEMIA 05/01/2008  . Smoker 02/16/2007  . CARCINOMA, KIDNEY 04/28/2006  . Hypothyroidism 04/28/2006  . HASHIMOTO'S THYROIDITIS 04/28/2006  . HYPERCHOLESTEROLEMIA 04/28/2006  . BIPOLAR AFFECTIVE DISORDER 04/28/2006  . ABUSIVE PERSONALITY 04/28/2006  . DEAFNESS 04/28/2006  . COPD 04/28/2006  . OSTEOARTHRITIS 04/28/2006  . [place] DISEASE 04/28/2006  . SLEEP APNEA 04/28/2006   Past Medical History  Diagnosis Date  . COPD (chronic obstructive pulmonary disease)   . Hypertension   . Thyroid disease   . Osteoarthritis   . Bipolar 1 disorder   . Cancer of kidney   . Urinary retention   . Deafness     cochlear implants bilat  . Hyperlipidemia   . Allergy   . Macular degeneration     left eye  . Osteopenia   . Obstructive sleep apnea on CPAP   . E. coli UTI (urinary tract infection)   . Sepsis   . Renal failure    Past Surgical History  Procedure Laterality Date  . Appendectomy    . Cholecystectomy    . Abdominal hysterectomy    .  Bladder suspension      bladder tack  . Nephrectomy      left  . Rotator cuff repair    . Tonsillectomy    . External ear surgery      x3  . Tibia fracture surgery      as a child  . Liver biopsy  02/1998    cysts  . Leep  2002  . Septoplasty  06/2003  . Cervical disc surgery     History  Substance Use Topics  . Smoking status: Current Some Day Smoker -- 0.50 packs/day for 40 years    Types: Cigarettes  . Smokeless tobacco: Not on file     Comment: has quit in the past on and off   . Alcohol Use: 0.0 oz/week    0 Standard drinks or equivalent per week     Comment: rare   No family history on  file. Allergies  Allergen Reactions  . Spearmint Oil Hives  . Ezetimibe     REACTION: elevated lfts  . Fluvastatin Sodium     REACTION: elevated LFT's  . Naproxen     REACTION: GI  . Tape     Other reaction(s): UNKNOWN   Current Outpatient Prescriptions on File Prior to Visit  Medication Sig Dispense Refill  . Aclidinium Bromide (TUDORZA PRESSAIR) 400 MCG/ACT AEPB Inhale 400 mcg into the lungs every 12 (twelve) hours.    Marland Kitchen albuterol (PROVENTIL,VENTOLIN) 90 MCG/ACT inhaler Inhale 2 puffs into the lungs every 6 (six) hours as needed. For shortness of breath    . amLODipine (NORVASC) 10 MG tablet Take 1 tablet (10 mg total) by mouth daily. 30 tablet 1  . ARIPiprazole (ABILIFY) 20 MG tablet Take 20 mg by mouth at bedtime.    Marland Kitchen atorvastatin (LIPITOR) 10 MG tablet TAKE 1 TABLET (10 MG TOTAL) BY MOUTH DAILY. 90 tablet 1  . buPROPion (WELLBUTRIN XL) 300 MG 24 hr tablet Take 300 mg by mouth daily.     . busPIRone (BUSPAR) 15 MG tablet Take 15 mg by mouth 2 (two) times daily.      . ciprofloxacin (CIPRO) 500 MG tablet Take 1 tablet (500 mg total) by mouth daily. 10 tablet 0  . clonazePAM (KLONOPIN) 0.5 MG tablet Take 0.5 mg by mouth 2 (two) times daily as needed. For nerves    . fluticasone (FLONASE) 50 MCG/ACT nasal spray 1 spray by Nasal route 2 (two) times daily.      . Fluticasone-Salmeterol (ADVAIR DISKUS) 250-50 MCG/DOSE AEPB Inhale 2 puffs into the lungs 2 (two) times daily.     Marland Kitchen levothyroxine (SYNTHROID, LEVOTHROID) 100 MCG tablet TAKE 1 TABLET (100 MCG TOTAL) BY MOUTH DAILY. 90 tablet 1  . Melatonin 3 MG TABS Takes 3-10 mg per night    . Multiple Vitamins-Minerals (CENTRUM SILVER PO) Take 1 tablet by mouth daily.     Marland Kitchen oxcarbazepine (TRILEPTAL) 600 MG tablet Take 600 mg by mouth 2 (two) times daily.     . predniSONE (DELTASONE) 10 MG tablet Take 4 tablets (40 mg total) by mouth daily with breakfast. Decrease by one tablet daily 10 tablet 0  . Tamsulosin HCl (FLOMAX) 0.4 MG CAPS Take  0.4 mg by mouth daily.     . traZODone (DESYREL) 100 MG tablet Take 150 mg by mouth at bedtime.     . [DISCONTINUED] Calcium-Vitamin D (CALTRATE 600 PLUS-VIT D PO) Take by mouth daily.       No current facility-administered medications on file prior  to visit.    Review of Systems    Review of Systems  Constitutional: Negative for fever, appetite change,  and unexpected weight change. pos for fatigue and day time somnolence  Eyes: Negative for pain and visual disturbance.  Respiratory: Negative for  shortness of breath.  pos for cough/wheeze that are much improved  Cardiovascular: Negative for cp or palpitations    Gastrointestinal: Negative for nausea, diarrhea and constipation.  Genitourinary: Negative for urgency and frequency. neg for dysuria or hematuria  Skin: Negative for pallor or rash   Neurological: Negative for weakness, light-headedness, numbness and headaches.  Hematological: Negative for adenopathy. Does not bruise/bleed easily.  Psychiatric/Behavioral: Negative for dysphoric mood. The patient is not nervous/anxious.  pos for somnolence/lethargy     Objective:   Physical Exam  Constitutional: She appears well-developed and well-nourished. No distress.  In wheelchair-sleepy and lethargic but answers questions appropriately   HENT:  Head: Normocephalic and atraumatic.  Mouth/Throat: Oropharynx is clear and moist.  Eyes: Conjunctivae and EOM are normal. Pupils are equal, round, and reactive to light. No scleral icterus.  Neck: Normal range of motion. Neck supple. No JVD present. Carotid bruit is not present.  Cardiovascular: Normal rate, regular rhythm and normal heart sounds.   Pulmonary/Chest: Effort normal. No respiratory distress. She has wheezes. She has no rales.  Diffusely distant bs Wheeze on forced exp No rales or rhonchi    Abdominal: Soft. Bowel sounds are normal. There is no tenderness. There is no rebound and no guarding.  Musculoskeletal: She exhibits  edema.  One plus pedal edema   Lymphadenopathy:    She has no cervical adenopathy.  Neurological: She is alert. She has normal reflexes. No cranial nerve deficit. She exhibits normal muscle tone. Coordination normal.  Skin: Skin is warm and dry. No rash noted. No erythema. No pallor.  Psychiatric: She has a normal mood and affect.          Assessment & Plan:   Problem List Items Addressed This Visit      Cardiovascular and Mediastinum   HTN (hypertension)    bp is up off several meds incl ace and lasix from hosp after sepsis with renal failure Lab today  Then will add back medication as tolerated depending on labs         Respiratory   Acute on chronic respiratory failure with hypoxia    Improved in copd pt who still smokes  Rev hosp records in detail  Enc to quit smoking       HCAP (healthcare-associated pneumonia) - Primary    Reviewed hosp meds and studies in detail  Lungs are baseline today Has smoked several cigarettes after coming home - counseled on this Is somewhat somnolent today- lab pending         Genitourinary   Acute kidney failure    With uti and sepsis in hospital  Improved cr at d/c but not to baseline  Rev records and labs in detail  Renal lab today Will not re start lasix/ace or other held meds until labs return  She is having edema- suspect s/p hydration and w/o lasix       Relevant Orders   Renal function panel (Completed)   Sepsis secondary to UTI    Re check urine cx today D/c on cipro after IV abx in hosp (vanco and cefepime) No urinary symptoms  Pt is more somnolent today however (husband suspects from her melatonin)  Rev hospital records and studies in detail  Lab today      Relevant Orders   CBC with Differential/Platelet (Completed)   Urine culture

## 2014-03-20 ENCOUNTER — Encounter (HOSPITAL_COMMUNITY): Payer: Self-pay

## 2014-03-20 ENCOUNTER — Telehealth: Payer: Self-pay | Admitting: Family Medicine

## 2014-03-20 ENCOUNTER — Inpatient Hospital Stay (HOSPITAL_COMMUNITY): Payer: Medicare Other

## 2014-03-20 ENCOUNTER — Emergency Department (HOSPITAL_COMMUNITY): Payer: Medicare Other

## 2014-03-20 DIAGNOSIS — Q600 Renal agenesis, unilateral: Secondary | ICD-10-CM

## 2014-03-20 DIAGNOSIS — R600 Localized edema: Secondary | ICD-10-CM | POA: Diagnosis present

## 2014-03-20 DIAGNOSIS — IMO0002 Reserved for concepts with insufficient information to code with codable children: Secondary | ICD-10-CM

## 2014-03-20 DIAGNOSIS — N179 Acute kidney failure, unspecified: Principal | ICD-10-CM

## 2014-03-20 DIAGNOSIS — Z888 Allergy status to other drugs, medicaments and biological substances status: Secondary | ICD-10-CM | POA: Diagnosis not present

## 2014-03-20 DIAGNOSIS — H3530 Unspecified macular degeneration: Secondary | ICD-10-CM | POA: Diagnosis present

## 2014-03-20 DIAGNOSIS — B9620 Unspecified Escherichia coli [E. coli] as the cause of diseases classified elsewhere: Secondary | ICD-10-CM | POA: Diagnosis present

## 2014-03-20 DIAGNOSIS — Z91048 Other nonmedicinal substance allergy status: Secondary | ICD-10-CM | POA: Diagnosis not present

## 2014-03-20 DIAGNOSIS — F17210 Nicotine dependence, cigarettes, uncomplicated: Secondary | ICD-10-CM | POA: Diagnosis present

## 2014-03-20 DIAGNOSIS — I10 Essential (primary) hypertension: Secondary | ICD-10-CM | POA: Diagnosis present

## 2014-03-20 DIAGNOSIS — N19 Unspecified kidney failure: Secondary | ICD-10-CM | POA: Diagnosis present

## 2014-03-20 DIAGNOSIS — Q602 Renal agenesis, unspecified: Secondary | ICD-10-CM

## 2014-03-20 DIAGNOSIS — R591 Generalized enlarged lymph nodes: Secondary | ICD-10-CM | POA: Diagnosis present

## 2014-03-20 DIAGNOSIS — Z85528 Personal history of other malignant neoplasm of kidney: Secondary | ICD-10-CM

## 2014-03-20 DIAGNOSIS — Z66 Do not resuscitate: Secondary | ICD-10-CM | POA: Diagnosis present

## 2014-03-20 DIAGNOSIS — E785 Hyperlipidemia, unspecified: Secondary | ICD-10-CM | POA: Diagnosis present

## 2014-03-20 DIAGNOSIS — N390 Urinary tract infection, site not specified: Secondary | ICD-10-CM | POA: Diagnosis present

## 2014-03-20 DIAGNOSIS — N3289 Other specified disorders of bladder: Secondary | ICD-10-CM | POA: Diagnosis present

## 2014-03-20 DIAGNOSIS — Z9049 Acquired absence of other specified parts of digestive tract: Secondary | ICD-10-CM | POA: Diagnosis present

## 2014-03-20 DIAGNOSIS — Z905 Acquired absence of kidney: Secondary | ICD-10-CM | POA: Diagnosis present

## 2014-03-20 DIAGNOSIS — E039 Hypothyroidism, unspecified: Secondary | ICD-10-CM | POA: Diagnosis present

## 2014-03-20 DIAGNOSIS — G4733 Obstructive sleep apnea (adult) (pediatric): Secondary | ICD-10-CM | POA: Diagnosis present

## 2014-03-20 DIAGNOSIS — R599 Enlarged lymph nodes, unspecified: Secondary | ICD-10-CM

## 2014-03-20 DIAGNOSIS — N131 Hydronephrosis with ureteral stricture, not elsewhere classified: Secondary | ICD-10-CM | POA: Diagnosis present

## 2014-03-20 DIAGNOSIS — Z8551 Personal history of malignant neoplasm of bladder: Secondary | ICD-10-CM | POA: Diagnosis not present

## 2014-03-20 DIAGNOSIS — J449 Chronic obstructive pulmonary disease, unspecified: Secondary | ICD-10-CM | POA: Diagnosis present

## 2014-03-20 DIAGNOSIS — Z90710 Acquired absence of both cervix and uterus: Secondary | ICD-10-CM | POA: Diagnosis not present

## 2014-03-20 DIAGNOSIS — H9190 Unspecified hearing loss, unspecified ear: Secondary | ICD-10-CM | POA: Diagnosis present

## 2014-03-20 DIAGNOSIS — M1990 Unspecified osteoarthritis, unspecified site: Secondary | ICD-10-CM | POA: Diagnosis present

## 2014-03-20 DIAGNOSIS — F319 Bipolar disorder, unspecified: Secondary | ICD-10-CM | POA: Diagnosis present

## 2014-03-20 DIAGNOSIS — E079 Disorder of thyroid, unspecified: Secondary | ICD-10-CM | POA: Diagnosis present

## 2014-03-20 DIAGNOSIS — R590 Localized enlarged lymph nodes: Secondary | ICD-10-CM | POA: Diagnosis present

## 2014-03-20 DIAGNOSIS — N1339 Other hydronephrosis: Secondary | ICD-10-CM | POA: Diagnosis present

## 2014-03-20 DIAGNOSIS — N329 Bladder disorder, unspecified: Secondary | ICD-10-CM | POA: Diagnosis present

## 2014-03-20 LAB — PROTIME-INR
INR: 1.08 (ref 0.00–1.49)
Prothrombin Time: 14.1 seconds (ref 11.6–15.2)

## 2014-03-20 LAB — CBC
HEMATOCRIT: 26.3 % — AB (ref 36.0–46.0)
Hemoglobin: 9 g/dL — ABNORMAL LOW (ref 12.0–15.0)
MCH: 30.3 pg (ref 26.0–34.0)
MCHC: 34.2 g/dL (ref 30.0–36.0)
MCV: 88.6 fL (ref 78.0–100.0)
PLATELETS: 391 10*3/uL (ref 150–400)
RBC: 2.97 MIL/uL — ABNORMAL LOW (ref 3.87–5.11)
RDW: 14.9 % (ref 11.5–15.5)
WBC: 11.6 10*3/uL — ABNORMAL HIGH (ref 4.0–10.5)

## 2014-03-20 LAB — BASIC METABOLIC PANEL
Anion gap: 17 — ABNORMAL HIGH (ref 5–15)
BUN: 56 mg/dL — ABNORMAL HIGH (ref 6–23)
CO2: 17 mmol/L — ABNORMAL LOW (ref 19–32)
Calcium: 8 mg/dL — ABNORMAL LOW (ref 8.4–10.5)
Chloride: 97 mmol/L (ref 96–112)
Creatinine, Ser: 6.07 mg/dL — ABNORMAL HIGH (ref 0.50–1.10)
GFR calc Af Amer: 8 mL/min — ABNORMAL LOW (ref >90)
GFR calc non Af Amer: 7 mL/min — ABNORMAL LOW (ref >90)
GLUCOSE: 78 mg/dL (ref 70–99)
Potassium: 4.9 mmol/L (ref 3.5–5.1)
SODIUM: 131 mmol/L — AB (ref 135–145)

## 2014-03-20 LAB — CLOSTRIDIUM DIFFICILE BY PCR: Toxigenic C. Difficile by PCR: NEGATIVE

## 2014-03-20 MED ORDER — CEFAZOLIN SODIUM 1-5 GM-% IV SOLN
1.0000 g | INTRAVENOUS | Status: AC
  Administered 2014-03-20: 1 g via INTRAVENOUS
  Filled 2014-03-20: qty 50

## 2014-03-20 MED ORDER — ALBUTEROL SULFATE (2.5 MG/3ML) 0.083% IN NEBU: 3.0000 mL | INHALATION_SOLUTION | Freq: Four times a day (QID) | RESPIRATORY_TRACT | Status: DC | PRN

## 2014-03-20 MED ORDER — MIDAZOLAM HCL 2 MG/2ML IJ SOLN
INTRAMUSCULAR | Status: AC
  Filled 2014-03-20: qty 2

## 2014-03-20 MED ORDER — MIDAZOLAM HCL 2 MG/2ML IJ SOLN
INTRAMUSCULAR | Status: AC | PRN
  Administered 2014-03-20: 1 mg via INTRAVENOUS

## 2014-03-20 MED ORDER — ARIPIPRAZOLE 10 MG PO TABS
20.0000 mg | ORAL_TABLET | Freq: Every day | ORAL | Status: DC
  Administered 2014-03-20 – 2014-03-22 (×3): 20 mg via ORAL
  Filled 2014-03-20 (×5): qty 2

## 2014-03-20 MED ORDER — ONDANSETRON HCL 4 MG/2ML IJ SOLN
4.0000 mg | Freq: Four times a day (QID) | INTRAMUSCULAR | Status: DC | PRN
  Administered 2014-03-20 – 2014-03-22 (×3): 4 mg via INTRAVENOUS
  Filled 2014-03-20 (×3): qty 2

## 2014-03-20 MED ORDER — LIDOCAINE HCL 1 % IJ SOLN
INTRAMUSCULAR | Status: AC
Start: 2014-03-20 — End: 2014-03-21
  Filled 2014-03-20: qty 20

## 2014-03-20 MED ORDER — ATORVASTATIN CALCIUM 10 MG PO TABS
10.0000 mg | ORAL_TABLET | Freq: Every day | ORAL | Status: DC
  Administered 2014-03-20 – 2014-03-22 (×3): 10 mg via ORAL
  Filled 2014-03-20 (×4): qty 1

## 2014-03-20 MED ORDER — BUSPIRONE HCL 15 MG PO TABS
15.0000 mg | ORAL_TABLET | Freq: Two times a day (BID) | ORAL | Status: DC
  Filled 2014-03-20 (×2): qty 1
  Filled 2014-03-20: qty 1.5

## 2014-03-20 MED ORDER — FENTANYL CITRATE 0.05 MG/ML IJ SOLN
INTRAMUSCULAR | Status: AC | PRN
  Administered 2014-03-20: 25 ug via INTRAVENOUS
  Administered 2014-03-20: 50 ug via INTRAVENOUS

## 2014-03-20 MED ORDER — FENTANYL CITRATE 0.05 MG/ML IJ SOLN
INTRAMUSCULAR | Status: AC
  Filled 2014-03-20: qty 2

## 2014-03-20 MED ORDER — TRAZODONE HCL 150 MG PO TABS
150.0000 mg | ORAL_TABLET | Freq: Every day | ORAL | Status: DC
  Administered 2014-03-20 – 2014-03-22 (×3): 150 mg via ORAL
  Filled 2014-03-20 (×5): qty 1

## 2014-03-20 MED ORDER — FLUTICASONE PROPIONATE 50 MCG/ACT NA SUSP
1.0000 | Freq: Two times a day (BID) | NASAL | Status: DC
  Administered 2014-03-20 – 2014-03-23 (×6): 1 via NASAL
  Filled 2014-03-20: qty 16

## 2014-03-20 MED ORDER — BUSPIRONE HCL 15 MG PO TABS
7.5000 mg | ORAL_TABLET | Freq: Two times a day (BID) | ORAL | Status: DC
  Administered 2014-03-20 – 2014-03-23 (×6): 7.5 mg via ORAL
  Filled 2014-03-20 (×7): qty 1

## 2014-03-20 MED ORDER — MOMETASONE FURO-FORMOTEROL FUM 100-5 MCG/ACT IN AERO
2.0000 | INHALATION_SPRAY | Freq: Two times a day (BID) | RESPIRATORY_TRACT | Status: DC
  Administered 2014-03-20 – 2014-03-23 (×6): 2 via RESPIRATORY_TRACT
  Filled 2014-03-20: qty 8.8

## 2014-03-20 MED ORDER — CETYLPYRIDINIUM CHLORIDE 0.05 % MT LIQD
7.0000 mL | Freq: Two times a day (BID) | OROMUCOSAL | Status: DC
  Administered 2014-03-20 – 2014-03-23 (×5): 7 mL via OROMUCOSAL

## 2014-03-20 MED ORDER — AMLODIPINE BESYLATE 10 MG PO TABS
10.0000 mg | ORAL_TABLET | Freq: Every day | ORAL | Status: DC
  Administered 2014-03-20 – 2014-03-23 (×4): 10 mg via ORAL
  Filled 2014-03-20 (×4): qty 1

## 2014-03-20 MED ORDER — CIPROFLOXACIN HCL 500 MG PO TABS: 500.0000 mg | ORAL_TABLET | Freq: Every day | ORAL | Status: DC

## 2014-03-20 MED ORDER — TIOTROPIUM BROMIDE MONOHYDRATE 18 MCG IN CAPS
18.0000 ug | ORAL_CAPSULE | Freq: Every day | RESPIRATORY_TRACT | Status: DC
  Administered 2014-03-21 – 2014-03-22 (×2): 18 ug via RESPIRATORY_TRACT
  Filled 2014-03-20: qty 5

## 2014-03-20 MED ORDER — LEVOTHYROXINE SODIUM 150 MCG PO TABS
150.0000 ug | ORAL_TABLET | Freq: Every day | ORAL | Status: DC
  Administered 2014-03-20 – 2014-03-23 (×4): 150 ug via ORAL
  Filled 2014-03-20 (×5): qty 1

## 2014-03-20 MED ORDER — ACETAMINOPHEN 325 MG PO TABS
650.0000 mg | ORAL_TABLET | Freq: Four times a day (QID) | ORAL | Status: DC | PRN
  Administered 2014-03-20 – 2014-03-23 (×8): 650 mg via ORAL
  Filled 2014-03-20 (×9): qty 2

## 2014-03-20 MED ORDER — OXCARBAZEPINE 300 MG PO TABS
600.0000 mg | ORAL_TABLET | Freq: Two times a day (BID) | ORAL | Status: DC
  Administered 2014-03-20 – 2014-03-23 (×7): 600 mg via ORAL
  Filled 2014-03-20 (×9): qty 2

## 2014-03-20 MED ORDER — CLONAZEPAM 0.5 MG PO TABS
0.5000 mg | ORAL_TABLET | Freq: Two times a day (BID) | ORAL | Status: DC | PRN
  Administered 2014-03-22: 0.5 mg via ORAL
  Filled 2014-03-20: qty 1

## 2014-03-20 MED ORDER — BUPROPION HCL ER (XL) 300 MG PO TB24
300.0000 mg | ORAL_TABLET | Freq: Every day | ORAL | Status: DC
  Filled 2014-03-20: qty 1

## 2014-03-20 MED ORDER — CEFAZOLIN SODIUM 1-5 GM-% IV SOLN
INTRAVENOUS | Status: AC
  Filled 2014-03-20: qty 50

## 2014-03-20 MED ORDER — LABETALOL HCL 5 MG/ML IV SOLN
10.0000 mg | INTRAVENOUS | Status: DC | PRN
  Filled 2014-03-20: qty 4

## 2014-03-20 MED ORDER — PROMETHAZINE HCL 25 MG/ML IJ SOLN
12.5000 mg | Freq: Four times a day (QID) | INTRAMUSCULAR | Status: DC | PRN
  Administered 2014-03-20: 12.5 mg via INTRAVENOUS
  Filled 2014-03-20: qty 1

## 2014-03-20 MED ORDER — IOHEXOL 300 MG/ML  SOLN
25.0000 mL | INTRAMUSCULAR | Status: AC
  Administered 2014-03-20: 25 mL via ORAL

## 2014-03-20 MED ORDER — BUPROPION HCL ER (XL) 150 MG PO TB24
150.0000 mg | ORAL_TABLET | Freq: Every day | ORAL | Status: DC
  Administered 2014-03-21 – 2014-03-23 (×3): 150 mg via ORAL
  Filled 2014-03-20 (×3): qty 1

## 2014-03-20 MED ORDER — TAMSULOSIN HCL 0.4 MG PO CAPS
0.4000 mg | ORAL_CAPSULE | Freq: Every day | ORAL | Status: DC
  Filled 2014-03-20: qty 1

## 2014-03-20 NOTE — ED Provider Notes (Signed)
CSN: [id number]     Arrival date & time 03/19/14  1833 History   First MD Initiated Contact with Patient 03/19/14 2136     Chief Complaint  Patient presents with  . Abnormal Lab     (Consider location/radiation/quality/duration/timing/severity/associated sxs/prior Treatment) HPI Comments: Patient is a 64 year old female with a past medical history of COPD, bipolar disorder, hypothyroidism, single functioning kidney secondary to renal cancer, and OSA on CPAP who presents from her PCP's office after having labs drawn today which showed greatly increased creatinine. Patient was discharged 1 week ago from the hospital after being admitted for urosepsis. Patient had a follow up visit with Dr. Loura Pardon today where her creatinine was shown to be 5.7 and was instructed to come to the ED. Patient reports having chills at home as well as suprapubic abdominal pain without radiation. The pain is aching and moderate. No aggravating/alleviating factors. No other associated symptoms.    Past Medical History  Diagnosis Date  . COPD (chronic obstructive pulmonary disease)   . Hypertension   . Thyroid disease   . Osteoarthritis   . Bipolar 1 disorder   . Cancer of kidney   . Urinary retention   . Deafness     cochlear implants bilat  . Hyperlipidemia   . Allergy   . Macular degeneration     left eye  . Osteopenia   . Obstructive sleep apnea on CPAP   . E. coli UTI (urinary tract infection)   . Sepsis   . Renal failure    Past Surgical History  Procedure Laterality Date  . Appendectomy    . Cholecystectomy    . Abdominal hysterectomy    . Bladder suspension      bladder tack  . Nephrectomy      left  . Rotator cuff repair    . Tonsillectomy    . External ear surgery      x3  . Tibia fracture surgery      as a child  . Liver biopsy  02/1998    cysts  . Leep  2002  . Septoplasty  06/2003  . Cervical disc surgery     No family history on file. History  Substance Use Topics  .  Smoking status: Current Some Day Smoker -- 0.50 packs/day for 40 years    Types: Cigarettes  . Smokeless tobacco: Not on file     Comment: has quit in the past on and off   . Alcohol Use: 0.0 oz/week    0 Standard drinks or equivalent per week     Comment: rare   OB History    No data available     Review of Systems  Constitutional: Positive for chills. Negative for fever and fatigue.  HENT: Negative for trouble swallowing.   Eyes: Negative for visual disturbance.  Respiratory: Negative for shortness of breath.   Cardiovascular: Negative for chest pain and palpitations.  Gastrointestinal: Positive for abdominal pain. Negative for nausea, vomiting and diarrhea.  Genitourinary: Negative for dysuria and difficulty urinating.  Musculoskeletal: Negative for arthralgias and neck pain.  Skin: Negative for color change.  Neurological: Negative for dizziness and weakness.  Psychiatric/Behavioral: Negative for dysphoric mood.      Allergies  Spearmint oil; Ezetimibe; Fluvastatin sodium; Naproxen; and Tape  Home Medications   Prior to Admission medications   Medication Sig Start Date End Date Taking? Authorizing Provider  Aclidinium Bromide (TUDORZA PRESSAIR) 400 MCG/ACT AEPB Inhale 400 mcg into the lungs  every 12 (twelve) hours.   Yes Historical Provider, MD  albuterol (PROVENTIL,VENTOLIN) 90 MCG/ACT inhaler Inhale 2 puffs into the lungs every 6 (six) hours as needed. For shortness of breath   Yes Historical Provider, MD  amLODipine (NORVASC) 10 MG tablet Take 1 tablet (10 mg total) by mouth daily. 03/12/14  Yes Orson Eva, MD  ARIPiprazole (ABILIFY) 20 MG tablet Take 20 mg by mouth at bedtime.   Yes Historical Provider, MD  atorvastatin (LIPITOR) 10 MG tablet TAKE 1 TABLET (10 MG TOTAL) BY MOUTH DAILY. 09/21/13  Yes Abner Greenspan, MD  buPROPion (WELLBUTRIN XL) 300 MG 24 hr tablet Take 300 mg by mouth daily.    Yes Historical Provider, MD  busPIRone (BUSPAR) 15 MG tablet Take 15 mg by  mouth 2 (two) times daily.     Yes Historical Provider, MD  ciprofloxacin (CIPRO) 500 MG tablet Take 1 tablet (500 mg total) by mouth daily. 03/12/14  Yes Orson Eva, MD  clonazePAM (KLONOPIN) 0.5 MG tablet Take 0.5 mg by mouth 2 (two) times daily as needed. For nerves   Yes Historical Provider, MD  fluticasone (FLONASE) 50 MCG/ACT nasal spray 1 spray by Nasal route 2 (two) times daily.     Yes Historical Provider, MD  Fluticasone-Salmeterol (ADVAIR DISKUS) 250-50 MCG/DOSE AEPB Inhale 2 puffs into the lungs 2 (two) times daily.    Yes Historical Provider, MD  levothyroxine (SYNTHROID, LEVOTHROID) 100 MCG tablet TAKE 1 TABLET (100 MCG TOTAL) BY MOUTH DAILY. 01/08/14  Yes Abner Greenspan, MD  Melatonin 3 MG TABS Takes 3-10 mg per night   Yes Historical Provider, MD  Multiple Vitamins-Minerals (CENTRUM SILVER PO) Take 1 tablet by mouth daily.    Yes Historical Provider, MD  oxcarbazepine (TRILEPTAL) 600 MG tablet Take 600 mg by mouth 2 (two) times daily.    Yes Historical Provider, MD  Tamsulosin HCl (FLOMAX) 0.4 MG CAPS Take 0.4 mg by mouth daily.    Yes Historical Provider, MD  traZODone (DESYREL) 100 MG tablet Take 150 mg by mouth at bedtime.    Yes Historical Provider, MD  predniSONE (DELTASONE) 10 MG tablet Take 4 tablets (40 mg total) by mouth daily with breakfast. Decrease by one tablet daily Patient not taking: Reported on 03/19/2014 03/13/14   Orson Eva, MD   BP 183/99 mmHg  Pulse 98  Temp(Src) 98.3 F (36.8 C) (Oral)  Resp 22  Ht 5' (1.524 m)  Wt 153 lb 12 oz (69.741 kg)  BMI 30.03 kg/m2  SpO2 95%  LMP 01/20/2000 Physical Exam  Constitutional: She is oriented to person, place, and time. She appears well-developed and well-nourished. No distress.  HENT:  Head: Normocephalic and atraumatic.  Eyes: Conjunctivae and EOM are normal.  Neck: Normal range of motion.  Cardiovascular: Normal rate and regular rhythm.  Exam reveals no gallop and no friction rub.   No murmur  heard. Pulmonary/Chest: Effort normal and breath sounds normal. She has no wheezes. She has no rales. She exhibits no tenderness.  Abdominal: Soft. She exhibits distension. There is tenderness. There is no rebound and no guarding.  Abdominal distension with suprapubic tenderness to palpation. No other tenderness to palpation.   Musculoskeletal: Normal range of motion.  Neurological: She is alert and oriented to person, place, and time. Coordination normal.  Speech is goal-oriented. Moves limbs without ataxia.   Skin: Skin is warm and dry.  Psychiatric: She has a normal mood and affect. Her behavior is normal.  Nursing note and vitals reviewed.  ED Course  Procedures (including critical care time) Labs Review Labs Reviewed  CBC WITH DIFFERENTIAL/PLATELET - Abnormal; Notable for the following:    WBC 12.0 (*)    RBC 3.04 (*)    Hemoglobin 9.0 (*)    HCT 26.6 (*)    Platelets 421 (*)    Neutrophils Relative % 89 (*)    Neutro Abs 10.7 (*)    Lymphocytes Relative 5 (*)    Lymphs Abs 0.6 (*)    All other components within normal limits  URINALYSIS, ROUTINE W REFLEX MICROSCOPIC - Abnormal; Notable for the following:    Color, Urine RED (*)    APPearance CLOUDY (*)    Hgb urine dipstick LARGE (*)    Ketones, ur 15 (*)    Protein, ur >300 (*)    Nitrite POSITIVE (*)    Leukocytes, UA SMALL (*)    All other components within normal limits  COMPREHENSIVE METABOLIC PANEL - Abnormal; Notable for the following:    Sodium 130 (*)    BUN 54 (*)    Creatinine, Ser 6.01 (*)    Calcium 8.0 (*)    Albumin 2.7 (*)    GFR calc non Af Amer 7 (*)    GFR calc Af Amer 8 (*)    All other components within normal limits  LIPASE, BLOOD - Abnormal; Notable for the following:    Lipase 70 (*)    All other components within normal limits  URINE MICROSCOPIC-ADD ON - Abnormal; Notable for the following:    Bacteria, UA MANY (*)    All other components within normal limits  CULTURE, BLOOD (ROUTINE  X 2)  CULTURE, BLOOD (ROUTINE X 2)    Imaging Review No results found.   EKG Interpretation None      MDM   Final diagnoses:  Renal failure  UTI (lower urinary tract infection)    12:06 AM Patient's labs show AKI secondary to UTI. Labs unremarkable for acute changes.   12:17 AM Patient has a UTI and will have a CT abdomen pelvis to rule out kidney stone. Patient will be admitted to Dr. Alcario Drought.   Alvina Chou, PA-C 03/20/14 0041  Carmin Muskrat, MD 03/22/14 [phone]

## 2014-03-20 NOTE — Progress Notes (Addendum)
New Admission Note:   Arrival: from ED via stretcher with tech Mental Orientation: A&Ox4 Telemetry: none ordered Assessment:  See doc flowsheet Skin: redness in groin area, bottom red but blanchable - barrier cream placed, bruise and scratches on left buttock IV: right AC Pain: tenderness on right side abdomen Safety Measures:  Call bell placed within reach; patient instructed on use of call bell and verbalized understanding. Bed in lowest position.  Yellow bracelet on.  Non-skid socks refused, but in room.  Bed alarm on.  Placed on enteric precautions d/t diarrhea. [place] Orientation: Patient oriented to staff, room, and unit. Family: family in room  Orders have been reviewed and implemented. Will continue to monitor.  Arlyss Queen, RN, BSN

## 2014-03-20 NOTE — Telephone Encounter (Signed)
Pts daughter Martha Patton brought in [place] paperwork to be able to take care of mother, Martha Patton

## 2014-03-20 NOTE — H&P (Signed)
[facility] History and Physical  LAVONDA THAL TMH:[id number] DOB: Dec 14, 1950 DOA: 03/19/2014  Referring physician: EDP PCP: Loura Pardon, MD   Chief Complaint: Abnormal lab   HPI: Martha Patton is a 64 y.o. female who presents to the ED after a hospital discharge follow up with her PCP today.  Labs drawn today demonstrate that her creatinine has gone up from 1.8 at discharge to 5.7 today at PCPs office.  This is confirmed on ED labs.  Patient was admitted on 2/18 with severe sepsis, AKI with creatinine of 3.0 at that time.  This due to HCAP and E.Coli UTI.  Although initially I didn't think she was going to survive without intubation due to her profound respiratory failure (tachypnic to the upper 40s / 50s in the ED initially), she thankfully managed to pull through.  Her AKI improved and creatinine was down to 1.8 at the time of discharge.  Although initially on Zosyn/Vanc for broad spectrum coverage, she was discharged on cipro only.  She has not taken any NSAIDS in the past week, only tylenol.  Her ACEI, and diuretics were held during the admission, and were not restarted at the time of discharge according to both family and Dr. Doristine Devoid discharge summary.  She has had a 6 day history of worsening, ascending, peripheral edema as well.  She does report RLQ abdominal / flank pain which has been ongoing for the past 6 days (family was not aware of this).  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  Review of Systems: Systems reviewed.  As above, otherwise negative  Past Medical History  Diagnosis Date  . COPD (chronic obstructive pulmonary disease)   . Hypertension   . Thyroid disease   . Osteoarthritis   . Bipolar 1 disorder   . Cancer of kidney   . Urinary retention   . Deafness     cochlear implants bilat  . Hyperlipidemia   . Allergy   . Macular degeneration     left eye  . Osteopenia   . Obstructive sleep apnea on CPAP    . E. coli UTI (urinary tract infection)   . Sepsis   . Renal failure    Past Surgical History  Procedure Laterality Date  . Appendectomy    . Cholecystectomy    . Abdominal hysterectomy    . Bladder suspension      bladder tack  . Nephrectomy      left  . Rotator cuff repair    . Tonsillectomy    . External ear surgery      x3  . Tibia fracture surgery      as a child  . Liver biopsy  02/1998    cysts  . Leep  2002  . Septoplasty  06/2003  . Cervical disc surgery     Social History:  reports that she has been smoking Cigarettes.  She has a 20 pack-year smoking history. She does not have any smokeless tobacco history on file. She reports that she drinks alcohol. She reports that she does not use illicit drugs.  Allergies  Allergen Reactions  . Spearmint Oil Hives  . Ezetimibe     REACTION: elevated lfts  . Fluvastatin Sodium     REACTION: elevated LFT's  . Naproxen     REACTION: GI  . Tape     Other reaction(s): UNKNOWN    No family history on file.   Prior to Admission  medications   Medication Sig Start Date End Date Taking? Authorizing Provider  Aclidinium Bromide (TUDORZA PRESSAIR) 400 MCG/ACT AEPB Inhale 400 mcg into the lungs every 12 (twelve) hours.   Yes Historical Provider, MD  albuterol (PROVENTIL,VENTOLIN) 90 MCG/ACT inhaler Inhale 2 puffs into the lungs every 6 (six) hours as needed. For shortness of breath   Yes Historical Provider, MD  amLODipine (NORVASC) 10 MG tablet Take 1 tablet (10 mg total) by mouth daily. 03/12/14  Yes Orson Eva, MD  ARIPiprazole (ABILIFY) 20 MG tablet Take 20 mg by mouth at bedtime.   Yes Historical Provider, MD  atorvastatin (LIPITOR) 10 MG tablet TAKE 1 TABLET (10 MG TOTAL) BY MOUTH DAILY. 09/21/13  Yes Abner Greenspan, MD  buPROPion (WELLBUTRIN XL) 300 MG 24 hr tablet Take 300 mg by mouth daily.    Yes Historical Provider, MD  busPIRone (BUSPAR) 15 MG tablet Take 15 mg by mouth 2 (two) times daily.     Yes Historical Provider, MD   ciprofloxacin (CIPRO) 500 MG tablet Take 1 tablet (500 mg total) by mouth daily. 03/12/14  Yes Orson Eva, MD  clonazePAM (KLONOPIN) 0.5 MG tablet Take 0.5 mg by mouth 2 (two) times daily as needed. For nerves   Yes Historical Provider, MD  fluticasone (FLONASE) 50 MCG/ACT nasal spray 1 spray by Nasal route 2 (two) times daily.     Yes Historical Provider, MD  Fluticasone-Salmeterol (ADVAIR DISKUS) 250-50 MCG/DOSE AEPB Inhale 2 puffs into the lungs 2 (two) times daily.    Yes Historical Provider, MD  levothyroxine (SYNTHROID, LEVOTHROID) 100 MCG tablet TAKE 1 TABLET (100 MCG TOTAL) BY MOUTH DAILY. 01/08/14  Yes Abner Greenspan, MD  Melatonin 3 MG TABS Takes 3-10 mg per night   Yes Historical Provider, MD  Multiple Vitamins-Minerals (CENTRUM SILVER PO) Take 1 tablet by mouth daily.    Yes Historical Provider, MD  oxcarbazepine (TRILEPTAL) 600 MG tablet Take 600 mg by mouth 2 (two) times daily.    Yes Historical Provider, MD  Tamsulosin HCl (FLOMAX) 0.4 MG CAPS Take 0.4 mg by mouth daily.    Yes Historical Provider, MD  traZODone (DESYREL) 100 MG tablet Take 150 mg by mouth at bedtime.    Yes Historical Provider, MD   Physical Exam: Filed Vitals:   03/20/14 0000  BP: 183/99  Pulse: 98  Temp:   Resp: 22    BP 183/99 mmHg  Pulse 98  Temp(Src) 98.3 F (36.8 C) (Oral)  Resp 22  Ht 5' (1.524 m)  Wt 69.741 kg (153 lb 12 oz)  BMI 30.03 kg/m2  SpO2 95%  LMP 01/20/2000  General Appearance:    Alert, oriented, no distress, appears stated age, extremely hard of hearing.  Head:    Normocephalic, atraumatic  Eyes:    PERRL, EOMI, sclera non-icteric        Nose:   Nares without drainage or epistaxis. Mucosa, turbinates normal  Throat:   Moist mucous membranes. Oropharynx without erythema or exudate.  Neck:   Supple. No carotid bruits.  No thyromegaly.  No lymphadenopathy.   Back:     No CVA tenderness, no spinal tenderness  Lungs:     Clear to auscultation bilaterally, without wheezes, rhonchi  or rales  Chest wall:    No tenderness to palpitation  Heart:    Regular rate and rhythm without murmurs, gallops, rubs  Abdomen:     Soft, non-tender, nondistended, normal bowel sounds, no organomegaly  Genitalia:    deferred  Rectal:    deferred  Extremities:   Pitting edema BLE, 2+  Pulses:   2+ and symmetric all extremities  Skin:   Skin color, texture, turgor normal, no rashes or lesions  Lymph nodes:   Cervical, supraclavicular, and axillary nodes normal  Neurologic:   CNII-XII intact. Normal strength, sensation and reflexes      throughout    Labs on Admission:  Basic Metabolic Panel:  Recent Labs Lab 03/19/14 1320 03/19/14 2304  NA 131* 130*  K 5.0 4.7  CL 101 97  CO2 22 22  GLUCOSE 80 88  BUN 56* 54*  CREATININE 5.70* 6.01*  CALCIUM 8.1* 8.0*  PHOS 6.2*  --    Liver Function Tests:  Recent Labs Lab 03/19/14 1320 03/19/14 2304  AST  --  27  ALT  --  15  ALKPHOS  --  76  BILITOT  --  0.4  PROT  --  6.2  ALBUMIN 2.8* 2.7*    Recent Labs Lab 03/19/14 2304  LIPASE 70*   No results for input(s): AMMONIA in the last 168 hours. CBC:  Recent Labs Lab 03/19/14 1320 03/19/14 2304  WBC 10.4 12.0*  NEUTROABS 9.3* 10.7*  HGB 8.8 Repeated and verified X2.* 9.0*  HCT 26.4 Repeated and verified X2.* 26.6*  MCV 88.3 87.5  PLT 426.0* 421*   Cardiac Enzymes: No results for input(s): CKTOTAL, CKMB, CKMBINDEX, TROPONINI in the last 168 hours.  BNP (last 3 results) No results for input(s): PROBNP in the last 8760 hours. CBG: No results for input(s): GLUCAP in the last 168 hours.  Radiological Exams on Admission: No results found.  EKG: Independently reviewed.  Assessment/Plan Principal Problem:   Acute kidney failure Active Problems:   HTN (hypertension)   E-coli UTI   Bladder mass   Periaortic lymphadenopathy   Solitary right kidney   History of renal carcinoma   1. 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 1. Spoke with Dr. Jeffie Pollock of urology, he feels that ureteral stent placement is unlikely to be successful in this patient with a 4cm bladder mass obstructing her R ureter and recommends percutaneous nephrostomy tube to be done by IR as the most sure way of draining urine from the ureter and restoring kidney function. 2. Have put in consult to IR on EPIC, have also called and let the on call radiologist know about this. 3. Patient hasnt been on any nephrotoxic meds since last hospital stay. 4. Repeat BMP this morning, then follow BMP tomorrow and UOP after perc nephrostomy tube placed 2. HTN - continue amlodipine and Flomax, resume diuretics / ACEI when issue #1 resolved, patient hasnt been on diuretics or ACEI since the beginning of last hospital stay 2 weeks or so ago.  AKI at that time was most likely due to sepsis as evidenced by improvement in AKI during hospital stay. 3. E.Coli UTI - continue treatment with PO cipro 4. Bladder mass - highly suspicious for primary bladder cancer.  Patient also has periaortic lymph nodes that are suspicious for either lymph node metastasis of bladder cancer vs recurrence of her prior renal cell carcinoma. 1. Patient to follow up with Dr. Jacqlyn Larsen regarding this.  She likely will want biopsy. 2. Surgical options may be limited due to her severe COPD, it is my understanding that her pulmonologist recommends against intubation even for a colonoscopy.    Code Status:  DNR/DNI  Family Communication: Family at bedside Disposition Plan: Admit to inpatient   Time spent: 57 min  GARDNER, JARED M. [facility] Pager [phone]  If 7AM-7PM, please contact the day team taking care of the patient [email or web address] Password [facility] 03/20/2014, 12:42 AM

## 2014-03-20 NOTE — Evaluation (Signed)
Physical Therapy Evaluation Patient Details Name: Martha Patton MRN: [id number] DOB: [birth year] Today's Date: 03/20/2014   History of Present Illness  Pt is a 64 y.o. female who presents to the ED after a hospital discharge follow up with her PCP today.Labs drawn today demonstrate that her creatinine has gone up from 1.8 at discharge to 5.7 today at PCPs office.She has had a 6 day history of worsening, ascending, peripheral edema as well.She does report RLQ abdominal / flank pain which has been ongoing for the past 6 days (family was not aware of this).Patient does have a solitary R kidney secondary to previous L nephrectomy for kidney cancer.  Clinical Impression  Pt admitted with above diagnosis. Pt currently with functional limitations due to the deficits listed below (see PT Problem List). At the time of PT eval pt was able to perform transfers and ambulation with min guard assist to min assist. Pt fatigues very quickly as tolerance for functional activity is low, and pt very dyspneic by the end of gait training. Pt will benefit from skilled PT to increase their independence and safety with mobility to allow discharge to the venue listed below.    Pt on 3L/min supplemental O2 during gait training, and sats remained min 90's throughout. Pt was returned to 2.5L/min supplemental O2 at end of session.      Follow Up Recommendations SNF;Supervision/Assistance - 24 hour    Equipment Recommendations  None recommended by PT    Recommendations for Other Services       Precautions / Restrictions Precautions Precautions: Fall Restrictions Weight Bearing Restrictions: No      Mobility  Bed Mobility Overal bed mobility: Needs Assistance Bed Mobility: Supine to Sit     Supine to sit: Supervision;HOB elevated Sit to supine: Min guard   General bed mobility comments: Supervision for safety when transitioning to EOB. Min guard for LE elevation back to bed.   Transfers Overall transfer  level: Needs assistance Equipment used: None Transfers: Sit to/from Stand Sit to Stand: Min guard         General transfer comment: Close guard for safety. Pt stands without warning at times.  Ambulation/Gait Ambulation/Gait assistance: Min assist Ambulation Distance (Feet): 100 Feet Assistive device: Rolling walker (2 wheeled) Gait Pattern/deviations: Step-through pattern;Decreased stride length;Trunk flexed;Drifts right/left Gait velocity: Decreased Gait velocity interpretation: Below normal speed for age/gender General Gait Details: Assist for direction - need to be in front of patient for cueing as pt lip reads. Assist required for managing walker. Pt very fatigued and dyspneic at end of gait training. Pt able to ambulate short distance without RW however was very unsteady.  Stairs            Wheelchair Mobility    Modified Rankin (Stroke Patients Only)       Balance Overall balance assessment: Needs assistance Sitting-balance support: Feet supported;No upper extremity supported Sitting balance-Leahy Scale: Fair     Standing balance support: No upper extremity supported;During functional activity Standing balance-Leahy Scale: Poor                               Pertinent Vitals/Pain Pain Assessment: No/denies pain    Home Living Family/patient expects to be discharged to:: Skilled nursing facility Living Arrangements: Spouse/significant other                    Prior Function Level of Independence: Needs assistance   Gait /  Transfers Assistance Needed: Assist for balance. Recently told by MD needs to start using a walker. Pt has had falls at home.            Hand Dominance        Extremity/Trunk Assessment   Upper Extremity Assessment: Defer to OT evaluation           Lower Extremity Assessment: Generalized weakness      Cervical / Trunk Assessment: Kyphotic  Communication   Communication: HOH (Cochlear implants)   Cognition Arousal/Alertness: Awake/alert Behavior During Therapy: WFL for tasks assessed/performed Overall Cognitive Status: Within Functional Limits for tasks assessed                      General Comments      Exercises        Assessment/Plan    PT Assessment Patient needs continued PT services  PT Diagnosis Difficulty walking;Generalized weakness   PT Problem List Decreased strength;Decreased activity tolerance;Decreased mobility;Cardiopulmonary status limiting activity;Decreased knowledge of use of DME;Decreased safety awareness  PT Treatment Interventions DME instruction;Balance training;Gait training;Functional mobility training;Patient/family education;Therapeutic activities;Therapeutic exercise   PT Goals (Current goals can be found in the Care Plan section) Acute Rehab PT Goals Patient Stated Goal: Pt did not state goals during session.  PT Goal Formulation: With patient/family Time For Goal Achievement: 04/03/14 Potential to Achieve Goals: Good    Frequency Min 2X/week   Barriers to discharge        Co-evaluation               End of Session Equipment Utilized During Treatment: Gait belt;Oxygen Activity Tolerance: Patient limited by fatigue Patient left: with call bell/phone within reach;in bed;with family/visitor present;with bed alarm set Nurse Communication: Mobility status         Time: 9163-8466 PT Time Calculation (min) (ACUTE ONLY): 31 min   Charges:   PT Evaluation $Initial PT Evaluation Tier I: 1 Procedure PT Treatments $Gait Training: 8-22 mins   PT G Codes:        Rolinda Roan 2014-04-01, 1:01 PM   Rolinda Roan, PT, DPT Acute Rehabilitation Services Pager: [phone]

## 2014-03-20 NOTE — ED Notes (Signed)
Returned from CT.

## 2014-03-20 NOTE — ED Notes (Signed)
To CT

## 2014-03-20 NOTE — Progress Notes (Signed)
[facility]  Patient Status: Active (receiving services up to time of hospitalization)  [facility] is providing the following services: RN and PT  If patient discharges after hours, please call [phone].   Martha Patton 03/20/2014, 10:15 AM

## 2014-03-20 NOTE — Progress Notes (Addendum)
Pharmacy: Home Medication - Renal Adjustment  25 YOF with history of renal CA s/p L-nephrectomy with a single kidney who presented on 3/1 with acute kidney injury due to obstruction of the R-ureter by a bladder mass. Pharmacy was consulted to review home medications for the need for dose adjustments and/or discontinuation based on the CrCl.   Upon review of the patient's home medications and renal function - the following medications may need adjustment based on physician discretion:   1) Bupropion (Wellbutrin XL) - manufacturer's labeling suggests a reduction in dose and/or frequency be considered in patients with CrCl<90 ml/min. MAX dose recommendations for this drug per manufacturer's labeling is 300 mg/day and per guidelines - up to 450 mg/day. Elderly patients are at an increased risk for accumulation * Recommendation - Consider reducing the dose to 150 mg/day while the patient is recovering from AKI or wait to reduce if the patient starts to have ADEs such as CNS stimulation (i.e. mania), HTN, neuropsych events (ie. hallucinations, anxiety, etc.), seizures  2) Buspirone (Buspar) - patients with impaired renal function demonstrate increased plasma levels and a prolonged half-life * Recommendation - Consider reducing the dose to 7.5 mg bid while the patient is recovering from AKI or wait to reduce if the patient starts to have ADEs such as oversedation and dizziness  3) Oxcarbazepine (Trileptal) - patients with severe renal impairment should be increased slowly to achieve desired clinical response * Recommendation - Since the patient has already been established on a dose - there are no recommendations at this time to reduce the patient's dose unless the patient has ADEs related to elevated levels such as CNS effects (dizziness, drowsiness, headahce)  Let me know if you have any additional questions regarding medication adjustments!  Alycia Rossetti, PharmD, BCPS Clinical Pharmacist Pager:  [phone] 03/20/2014 1:27 PM

## 2014-03-20 NOTE — Progress Notes (Signed)
Dr. Wynelle Cleveland currently in patient's room. Told this RN to hold all PO medications this morning until she reviews her medication list. Will continue to monitor.  Joellen Jersey, RN.

## 2014-03-20 NOTE — Progress Notes (Signed)
Referring Physician(s): Dr. Jennette Kettle  Subjective: 64 yo with prior hx of renal cancer and left nephrectomy, admitted with ARF and found to have obstructing bladder mass with right hydronephrosis Urology consulted and requests IR place (R)PCN Chart, PMHx, meds, labs, imaging reviewed.  Past Medical History  Diagnosis Date  . COPD (chronic obstructive pulmonary disease)   . Hypertension   . Thyroid disease   . Osteoarthritis   . Bipolar 1 disorder   . Cancer of kidney   . Urinary retention   . Deafness     cochlear implants bilat  . Hyperlipidemia   . Allergy   . Macular degeneration     left eye  . Osteopenia   . Obstructive sleep apnea on CPAP   . E. coli UTI (urinary tract infection)   . Sepsis   . Renal failure    Past Surgical History  Procedure Laterality Date  . Appendectomy    . Cholecystectomy    . Abdominal hysterectomy    . Bladder suspension      bladder tack  . Nephrectomy      left  . Rotator cuff repair    . Tonsillectomy    . External ear surgery      x3  . Tibia fracture surgery      as a child  . Liver biopsy  02/1998    cysts  . Leep  2002  . Septoplasty  06/2003  . Cervical disc surgery     History   Social History  . Marital Status: Married    Spouse Name: N/A  . Number of Children: 2  . Years of Education: N/A   Occupational History  . on disability for psych    Social History Main Topics  . Smoking status: Current Some Day Smoker -- 0.50 packs/day for 40 years    Types: Cigarettes  . Smokeless tobacco: Not on file     Comment: has quit in the past on and off   . Alcohol Use: 0.0 oz/week    0 Standard drinks or equivalent per week     Comment: rare  . Drug Use: No  . Sexual Activity: Not on file   Other Topics Concern  . Not on file   Social History Narrative     Allergies: Spearmint oil; Ezetimibe; Fluvastatin sodium; Naproxen; and Tape  Medications: Prior to Admission medications   Medication Sig Start  Date End Date Taking? Authorizing Provider  Aclidinium Bromide (TUDORZA PRESSAIR) 400 MCG/ACT AEPB Inhale 400 mcg into the lungs every 12 (twelve) hours.   Yes Historical Provider, MD  albuterol (PROVENTIL,VENTOLIN) 90 MCG/ACT inhaler Inhale 2 puffs into the lungs every 6 (six) hours as needed. For shortness of breath   Yes Historical Provider, MD  amLODipine (NORVASC) 10 MG tablet Take 1 tablet (10 mg total) by mouth daily. 03/12/14  Yes Orson Eva, MD  ARIPiprazole (ABILIFY) 20 MG tablet Take 20 mg by mouth at bedtime.   Yes Historical Provider, MD  atorvastatin (LIPITOR) 10 MG tablet TAKE 1 TABLET (10 MG TOTAL) BY MOUTH DAILY. 09/21/13  Yes Abner Greenspan, MD  buPROPion (WELLBUTRIN XL) 300 MG 24 hr tablet Take 300 mg by mouth daily.    Yes Historical Provider, MD  busPIRone (BUSPAR) 15 MG tablet Take 15 mg by mouth 2 (two) times daily.     Yes Historical Provider, MD  ciprofloxacin (CIPRO) 500 MG tablet Take 1 tablet (500 mg total) by mouth daily. 03/12/14  Yes  Orson Eva, MD  clonazePAM (KLONOPIN) 0.5 MG tablet Take 0.5 mg by mouth 2 (two) times daily as needed. For nerves   Yes Historical Provider, MD  fluticasone (FLONASE) 50 MCG/ACT nasal spray 1 spray by Nasal route 2 (two) times daily.     Yes Historical Provider, MD  Fluticasone-Salmeterol (ADVAIR DISKUS) 250-50 MCG/DOSE AEPB Inhale 2 puffs into the lungs 2 (two) times daily.    Yes Historical Provider, MD  levothyroxine (SYNTHROID, LEVOTHROID) 100 MCG tablet TAKE 1 TABLET (100 MCG TOTAL) BY MOUTH DAILY. 01/08/14  Yes Abner Greenspan, MD  Melatonin 3 MG TABS Takes 3-10 mg per night   Yes Historical Provider, MD  Multiple Vitamins-Minerals (CENTRUM SILVER PO) Take 1 tablet by mouth daily.    Yes Historical Provider, MD  oxcarbazepine (TRILEPTAL) 600 MG tablet Take 600 mg by mouth 2 (two) times daily.    Yes Historical Provider, MD  Tamsulosin HCl (FLOMAX) 0.4 MG CAPS Take 0.4 mg by mouth daily.    Yes Historical Provider, MD  traZODone (DESYREL) 100  MG tablet Take 150 mg by mouth at bedtime.    Yes Historical Provider, MD    Review of Systems  Constitutional: Positive for fatigue.  HENT: Negative.   Respiratory: Negative.   Cardiovascular: Negative.   Gastrointestinal: Negative for nausea, vomiting and abdominal pain.  Genitourinary: Positive for flank pain and difficulty urinating. Negative for dysuria and hematuria.  Neurological: Negative.     Vital Signs: BP 157/92 mmHg  Pulse 104  Temp(Src) 98.1 F (36.7 C) (Oral)  Resp 21  Ht 5' 0.5" (1.537 m)  Wt 152 lb 4.8 oz (69.083 kg)  BMI 29.24 kg/m2  SpO2 99%  LMP 01/20/2000  Physical Exam General: NAD, A&O x 3 ENT: unremarkable airway Lungs: CTA without w/r/r Heart: Regular   Imaging: Ct Abdomen Pelvis Wo Contrast  03/20/2014   CLINICAL DATA:  Acute onset of suprapubic abdominal pain. Significantly worsening creatinine. Initial encounter.  EXAM: CT ABDOMEN AND PELVIS WITHOUT CONTRAST  TECHNIQUE: Multidetector CT imaging of the abdomen and pelvis was performed following the standard protocol without IV contrast.  COMPARISON:  None.  FINDINGS: Small bilateral pleural effusions are noted. Trace pericardial fluid is also seen, within normal limits.  Small to moderate volume ascites is seen along the abdomen and pelvis, demonstrating mild bulging along the left lateral abdominal wall.  There is a 10.6 cm cyst within the right hepatic lobe. The patient is status post cholecystectomy. The liver and spleen are grossly unremarkable in appearance, though the spleen is surrounded by fluid. The pancreas and adrenal glands are unremarkable.  Relatively severe chronic right-sided hydronephrosis is noted. This appears to be secondary to a right-sided bladder mass. The left kidney has been resected, reflecting the patient's history of renal cell carcinoma. Right-sided perinephric stranding is noted. No renal or ureteral stones are seen.  The small bowel is unremarkable in appearance. The stomach  is within normal limits.  The abdominal aorta is difficult to assess without contrast. Relatively diffuse calcification is seen along the abdominal aorta and its branches. There is diffuse soft tissue density surrounding the abdominal aorta along most of its course, concerning for matted lymphadenopathy or surrounding mass. Significantly enlarged mesenteric nodes are seen, suspicious for a metastatic process. This could conceivably reflect either the bladder mass or the patient's known history of renal cell carcinoma.  The patient is status post appendectomy. Scattered diverticulosis is noted along the descending and sigmoid colon, without evidence of diverticulitis. The colon  is otherwise unremarkable.  The bladder is decompressed. There is an asymmetric 4.4 x 4.2 x 3.3 cm mass at the right side of the bladder, concerning for malignancy. This likely explains the patient's right-sided hydronephrosis. The patient is status post hysterectomy. No suspicious adnexal masses are seen. No inguinal lymphadenopathy is seen.  Two small anterior abdominal hernias are noted just to the right of midline, containing only fat and trace fluid.  Diffuse soft tissue edema is noted along the abdominal wall, compatible with mild anasarca.  No acute osseous abnormalities are identified. 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  IMPRESSION: 1. Relatively severe chronic right-sided hydronephrosis noted. This appears to be secondary to a right-sided bladder mass, as described below. Given prior left sided nephrectomy, this explains the patient's renal failure. 2. 4.4 x 4.2 x 3.3 cm mass noted arising at the right side of the bladder, concerning for primary malignancy. 3. Diffuse soft tissue density surrounding the abdominal aorta along most of its course, concerning for matted lymphadenopathy or surrounding mass. Significantly enlarged mesenteric nodes seen, suspicious for a metastatic process.  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 4. Small bilateral pleural effusions noted. 5. Small to moderate volume ascites within the abdomen and pelvis. 6. Large 10.6 cm cyst within the right hepatic lobe. 7. Two small anterior abdominal wall hernias noted just to the right of midline, containing only fat and trace fluid. 8. Relatively diffuse calcification along the abdominal aorta and its branches; the abdominal aorta is not well assessed without contrast. 9. Diffuse soft tissue edema along the abdominal wall, compatible with mild anasarca. 10. Scattered diverticulosis along the descending and sigmoid colon, without evidence of diverticulitis. 11. 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   Electronically Signed   By: Garald Balding M.D.   On: 03/20/2014 03:22    Labs:  CBC:  Recent Labs  03/11/14 0537 03/19/14 1320 03/19/14 2304 03/20/14 0506  WBC 9.3 10.4 12.0* 11.6*  HGB 9.1* 8.8 Repeated and verified X2.* 9.0* 9.0*  HCT 27.6* 26.4 Repeated and verified X2.* 26.6* 26.3*  PLT 322 426.0* 421* 391    COAGS: No results for input(s): INR, APTT in the last 8760 hours.  BMP:  Recent Labs  03/11/14 0537 03/12/14 0840 03/19/14 1320 03/19/14 2304 03/20/14 0506  NA 136 135 131* 130* 131*  K 3.8 3.8 5.0 4.7 4.9  CL 107 106 101 97 97  CO2 22 25 22 22  17*  GLUCOSE 105* 99 80 88 78  BUN 29* 27* 56* 54* 56*  CALCIUM 8.2* 8.2* 8.1* 8.0* 8.0*  CREATININE 1.76* 1.82* 5.70* 6.01* 6.07*  GFRNONAA 29* 28*  --  7* 7*  GFRAA 34* 33*  --  8* 8*    LIVER FUNCTION TESTS:  Recent Labs  12/07/13 0855 02/10/14 1842 03/08/14 0301 03/19/14 1320 03/19/14 2304  BILITOT 0.5 0.4 0.6  --  0.4  AST 28 33 29  --  27  ALT 12 17 15   --  15  ALKPHOS 79 78 76  --  76  PROT 7.2 6.4 6.4  --  6.2  ALBUMIN 3.7 3.0* 2.9* 2.8* 2.7*    Assessment and Plan: Obstructive uropathy with right hydronephrosis  secondary to bladder mass ARF secondary to above Discussed plan for PCN placement Explained procedure, risks, complications, use of sedation Labs reviewed, checking INR Consent signed in chart   I  spent a total of 20 minutes face to face in clinical consultation/evaluation, greater than 50% of which was counseling/coordinating care for placement of right percutaneous nephrostomy  Signed: Ascencion Dike 03/20/2014, 8:37 AM

## 2014-03-20 NOTE — Progress Notes (Signed)
OT Cancellation Note  Patient Details Name: Martha Patton MRN: [id number] DOB: 03/03/1950   Cancelled Treatment:    Reason Eval/Treat Not Completed: Patient at procedure or test/ unavailable  Benito Mccreedy OTR/L [id number] 03/20/2014, 2:08 PM

## 2014-03-20 NOTE — Progress Notes (Signed)
Patient's family educated on enteric precautions and proper hand washing. Stool sample obtained.   Joellen Jersey, RN.

## 2014-03-20 NOTE — Evaluation (Signed)
Occupational Therapy Evaluation Patient Details Name: Martha Patton MRN: [id number] DOB: 12-11-1950 Today's Date: 03/20/2014    History of Present Illness Pt is a 64 y.o. female who presents to the ED after a hospital discharge follow up with her PCP today.Labs drawn today demonstrate that her creatinine has gone up from 1.8 at discharge to 5.7 today at PCPs office.She has had a 6 day history of worsening, ascending, peripheral edema as well.She does report RLQ abdominal / flank pain which has been ongoing for the past 6 days (family was not aware of this).Patient does have a solitary R kidney secondary to previous L nephrectomy for kidney cancer.   Clinical Impression   Pt admitted with above. Pt requiring assist with ADLs, PTA. Feel pt will benefit from acute OT to increase independence, strength and activity tolerance prior to d/c.    Follow Up Recommendations  SNF;Supervision/Assistance - 24 hour    Equipment Recommendations  Other (comment) (defer to next venue)    Recommendations for Other Services       Precautions / Restrictions Precautions Precautions: Fall Restrictions Weight Bearing Restrictions: No      Mobility Bed Mobility Overal bed mobility: Needs Assistance Bed Mobility: Supine to Sit;Sit to Supine     Supine to sit: Supervision Sit to supine: Supervision   General bed mobility comments: pt taking increased time-decreased initiation.  Transfers Overall transfer level: Needs assistance   Transfers: Sit to/from Stand Sit to Stand: Min guard              Balance  Pt holding onto things in room with ambulation as well as OT provided some hand held assist.                                          ADL Overall ADL's : Needs assistance/impaired     Grooming: Wash/dry face;Set up;Supervision/safety;Sitting               Lower Body Dressing: Moderate assistance;Sit to/from stand   Toilet Transfer: Min guard;Minimal  assistance;Ambulation;BSC   Toileting- Clothing Manipulation and Hygiene: Minimal assistance;Sit to/from stand Toileting - Clothing Manipulation Details (indicate cue type and reason): helped to clean more thoroughly     Functional mobility during ADLs: Min guard;Minimal assistance General ADL Comments: Educated on energy conservation techniques and deep breathing technique. Educated on safety such as sitting for LB ADLs and not getting down in tub. Discussed d/c plan with pt and daughters.     Vision     Perception     Praxis      Pertinent Vitals/Pain Pain Assessment: No/denies pain; O2 in 90's in session on around 2L of O2. HR up to around 114.     Hand Dominance     Extremity/Trunk Assessment Upper Extremity Assessment Upper Extremity Assessment: Overall WFL for tasks assessed   Lower Extremity Assessment Lower Extremity Assessment: Defer to PT evaluation       Communication Communication Communication: HOH (cochlear implants)   Cognition Arousal/Alertness: Awake/alert Behavior During Therapy: WFL for tasks assessed/performed Overall Cognitive Status:  (unsure of baseline) Area of Impairment: Orientation;Problem solving Orientation Level: Disoriented to;Time           Problem Solving: Slow processing;Decreased initiation;Requires verbal cues     General Comments       Exercises       Shoulder Instructions      Home  Living Family/patient expects to be discharged to:: Skilled nursing facility Living Arrangements: Spouse/significant other                           Home Equipment: Hand held shower head;Shower seat - built in   Additional Comments: pt has walk in shower       Prior Functioning/Environment Level of Independence: Needs assistance  Gait / Transfers Assistance Needed: Assist for balance. Recently told by MD needs to start using a walker. Pt has had falls at home.  ADL's / Homemaking Assistance Needed: assist with LB  dressing        OT Diagnosis: Generalized weakness   OT Problem List: Decreased strength;Decreased activity tolerance;Impaired balance (sitting and/or standing);Decreased cognition;Decreased safety awareness;Decreased knowledge of use of DME or AE;Decreased knowledge of precautions;Cardiopulmonary status limiting activity   OT Treatment/Interventions: Self-care/ADL training;Therapeutic exercise;Energy conservation;DME and/or AE instruction;Therapeutic activities;Cognitive remediation/compensation;Patient/family education;Balance training    OT Goals(Current goals can be found in the care plan section) Acute Rehab OT Goals Patient Stated Goal: wanted to walk OT Goal Formulation: With patient/family Time For Goal Achievement: 03/27/14 Potential to Achieve Goals: Good ADL Goals Pt Will Perform Toileting - Clothing Manipulation and hygiene: with modified independence;sit to/from stand  OT Frequency: Min 2X/week   Barriers to D/C:            Co-evaluation              End of Session Equipment Utilized During Treatment: Gait belt;Oxygen Nurse Communication: Other (comment) (urine bag needs emptying)  Activity Tolerance: Patient limited by fatigue Patient left: in bed;with call bell/phone within reach;with bed alarm set;with family/visitor present   Time: 3790-2409 OT Time Calculation (min): 17 min Charges:  OT General Charges $OT Visit: 1 Procedure OT Evaluation $Initial OT Evaluation Tier I: 1 Procedure G-CodesBenito Mccreedy OTR/L [id number] 03/20/2014, 5:47 PM

## 2014-03-20 NOTE — Progress Notes (Signed)
[facility] f/u note:  Patient admitted this AM with acute renal failure and found to have a bladder mass obstructing right ureter and a para-aortic mass vs "matted Lymphadenopathy". Patient to undergo urostomy by IR today. Has h/o left nephrectomy in early 2000 for renal cell cancer.   Has a urologist by the name of Dr Jacqlyn Larsen- I tried to contact his office and received an answering machine- I left a message. Wanted to discuss with him if we can obtain the biopsy here at [facility].  Family is OK with having work up here as long as he gives the OK as well.   Principal Problem:   Acute kidney failure- in setting of solitary kidney - obstructive - f/u renal function after nephrostomy done - adjust medication base upon renal function- have asked pharmacy to look into this  Active Problems:   HTN (hypertension) - resume Norvasc, PRN Labetalol    Periaortic lymphadenopathy vs mass - uncertain if this is related to bladder mass    Solitary right kidney    History of renal carcinoma - s/p left nephrectomy  NOTE: pt incontinent of stool and urine at baseline and wears diapers  Debbe Odea, MD

## 2014-03-20 NOTE — ED Notes (Signed)
The patient is very swollen and red in the perineal area. The tech has reported to the RN IN CHARGE.

## 2014-03-20 NOTE — Progress Notes (Signed)
Patient returned from IR after having a right nephrostomy tube placed. Dressing clean, dry, intact. Output is clear and red. No s/s distress.  Joellen Jersey, RN.

## 2014-03-20 NOTE — Plan of Care (Signed)
Problem: Phase I Progression Outcomes Goal: Tolerating diet Outcome: Not Progressing Pt NPO for right nephrostomy tube placement.

## 2014-03-21 DIAGNOSIS — N19 Unspecified kidney failure: Secondary | ICD-10-CM

## 2014-03-21 DIAGNOSIS — N1330 Unspecified hydronephrosis: Secondary | ICD-10-CM

## 2014-03-21 DIAGNOSIS — N138 Other obstructive and reflux uropathy: Secondary | ICD-10-CM

## 2014-03-21 LAB — URINE CULTURE
COLONY COUNT: NO GROWTH
CULTURE: NO GROWTH
Colony Count: NO GROWTH
Organism ID, Bacteria: NO GROWTH

## 2014-03-21 LAB — BASIC METABOLIC PANEL
Anion gap: 8 (ref 5–15)
BUN: 48 mg/dL — ABNORMAL HIGH (ref 6–23)
CHLORIDE: 102 mmol/L (ref 96–112)
CO2: 23 mmol/L (ref 19–32)
Calcium: 7.9 mg/dL — ABNORMAL LOW (ref 8.4–10.5)
Creatinine, Ser: 4.39 mg/dL — ABNORMAL HIGH (ref 0.50–1.10)
GFR, EST AFRICAN AMERICAN: 11 mL/min — AB (ref >90)
GFR, EST NON AFRICAN AMERICAN: 10 mL/min — AB (ref >90)
Glucose, Bld: 152 mg/dL — ABNORMAL HIGH (ref 70–99)
Potassium: 4.4 mmol/L (ref 3.5–5.1)
SODIUM: 133 mmol/L — AB (ref 135–145)

## 2014-03-21 MED ORDER — HEPARIN SODIUM (PORCINE) 5000 UNIT/ML IJ SOLN
5000.0000 [IU] | Freq: Three times a day (TID) | INTRAMUSCULAR | Status: DC
  Administered 2014-03-21 – 2014-03-23 (×6): 5000 [IU] via SUBCUTANEOUS
  Filled 2014-03-21 (×7): qty 1

## 2014-03-21 NOTE — Telephone Encounter (Signed)
Completed forms placed in Allison's inbox

## 2014-03-21 NOTE — Progress Notes (Signed)
Referring Physician(s): TRH  Subjective: R hydronephrosis Solitary Rt kidney R PCN placed 3/1 in IR   Output great Checking Cr today Sleepy and comfortable now  Allergies: Spearmint oil; Ezetimibe; Fluvastatin sodium; Naproxen; and Tape  Medications: Prior to Admission medications   Medication Sig Start Date End Date Taking? Authorizing Provider  Aclidinium Bromide (TUDORZA PRESSAIR) 400 MCG/ACT AEPB Inhale 400 mcg into the lungs every 12 (twelve) hours.   Yes Historical Provider, MD  albuterol (PROVENTIL,VENTOLIN) 90 MCG/ACT inhaler Inhale 2 puffs into the lungs every 6 (six) hours as needed. For shortness of breath   Yes Historical Provider, MD  amLODipine (NORVASC) 10 MG tablet Take 1 tablet (10 mg total) by mouth daily. 03/12/14  Yes Orson Eva, MD  ARIPiprazole (ABILIFY) 20 MG tablet Take 20 mg by mouth at bedtime.   Yes Historical Provider, MD  atorvastatin (LIPITOR) 10 MG tablet TAKE 1 TABLET (10 MG TOTAL) BY MOUTH DAILY. 09/21/13  Yes Abner Greenspan, MD  buPROPion (WELLBUTRIN XL) 300 MG 24 hr tablet Take 300 mg by mouth daily.    Yes Historical Provider, MD  busPIRone (BUSPAR) 15 MG tablet Take 15 mg by mouth 2 (two) times daily.     Yes Historical Provider, MD  ciprofloxacin (CIPRO) 500 MG tablet Take 1 tablet (500 mg total) by mouth daily. 03/12/14  Yes Orson Eva, MD  clonazePAM (KLONOPIN) 0.5 MG tablet Take 0.5 mg by mouth 2 (two) times daily as needed. For nerves   Yes Historical Provider, MD  fluticasone (FLONASE) 50 MCG/ACT nasal spray 1 spray by Nasal route 2 (two) times daily.     Yes Historical Provider, MD  Fluticasone-Salmeterol (ADVAIR DISKUS) 250-50 MCG/DOSE AEPB Inhale 2 puffs into the lungs 2 (two) times daily.    Yes Historical Provider, MD  levothyroxine (SYNTHROID, LEVOTHROID) 100 MCG tablet TAKE 1 TABLET (100 MCG TOTAL) BY MOUTH DAILY. 01/08/14  Yes Abner Greenspan, MD  Melatonin 3 MG TABS Takes 3-10 mg per night   Yes Historical Provider, MD  Multiple  Vitamins-Minerals (CENTRUM SILVER PO) Take 1 tablet by mouth daily.    Yes Historical Provider, MD  oxcarbazepine (TRILEPTAL) 600 MG tablet Take 600 mg by mouth 2 (two) times daily.    Yes Historical Provider, MD  Tamsulosin HCl (FLOMAX) 0.4 MG CAPS Take 0.4 mg by mouth daily.    Yes Historical Provider, MD  traZODone (DESYREL) 100 MG tablet Take 150 mg by mouth at bedtime.    Yes Historical Provider, MD     Vital Signs: BP 145/89 mmHg  Pulse 94  Temp(Src) 97.5 F (36.4 C) (Oral)  Resp 17  Ht 5' 0.5" (1.537 m)  Wt 69.446 kg (153 lb 1.6 oz)  BMI 29.40 kg/m2  SpO2 99%  LMP 01/20/2000  Physical Exam  Skin: Skin is warm and dry.  Site of Rt PCN clean and dry NT No bleeding Output 2.5 liters yesterday 200 cc in bag now---clear yellow Cr pending    Imaging: Ct Abdomen Pelvis Wo Contrast  03/20/2014   CLINICAL DATA:  Acute onset of suprapubic abdominal pain. Significantly worsening creatinine. Initial encounter.  EXAM: CT ABDOMEN AND PELVIS WITHOUT CONTRAST  TECHNIQUE: Multidetector CT imaging of the abdomen and pelvis was performed following the standard protocol without IV contrast.  COMPARISON:  None.  FINDINGS: Small bilateral pleural effusions are noted. Trace pericardial fluid is also seen, within normal limits.  Small to moderate volume ascites is seen along the abdomen and pelvis, demonstrating mild bulging  along the left lateral abdominal wall.  There is a 10.6 cm cyst within the right hepatic lobe. The patient is status post cholecystectomy. The liver and spleen are grossly unremarkable in appearance, though the spleen is surrounded by fluid. The pancreas and adrenal glands are unremarkable.  Relatively severe chronic right-sided hydronephrosis is noted. This appears to be secondary to a right-sided bladder mass. The left kidney has been resected, reflecting the patient's history of renal cell carcinoma. Right-sided perinephric stranding is noted. No renal or ureteral stones are  seen.  The small bowel is unremarkable in appearance. The stomach is within normal limits.  The abdominal aorta is difficult to assess without contrast. Relatively diffuse calcification is seen along the abdominal aorta and its branches. There is diffuse soft tissue density surrounding the abdominal aorta along most of its course, concerning for matted lymphadenopathy or surrounding mass. Significantly enlarged mesenteric nodes are seen, suspicious for a metastatic process. This could conceivably reflect either the bladder mass or the patient's known history of renal cell carcinoma.  The patient is status post appendectomy. Scattered diverticulosis is noted along the descending and sigmoid colon, without evidence of diverticulitis. The colon is otherwise unremarkable.  The bladder is decompressed. There is an asymmetric 4.4 x 4.2 x 3.3 cm mass at the right side of the bladder, concerning for malignancy. This likely explains the patient's right-sided hydronephrosis. The patient is status post hysterectomy. No suspicious adnexal masses are seen. No inguinal lymphadenopathy is seen.  Two small anterior abdominal hernias are noted just to the right of midline, containing only fat and trace fluid.  Diffuse soft tissue edema is noted along the abdominal wall, compatible with mild anasarca.  No acute osseous abnormalities are identified. 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  IMPRESSION: 1. Relatively severe chronic right-sided hydronephrosis noted. This appears to be secondary to a right-sided bladder mass, as described below. Given prior left sided nephrectomy, this explains the patient's renal failure. 2. 4.4 x 4.2 x 3.3 cm mass noted arising at the right side of the bladder, concerning for primary malignancy. 3. Diffuse soft tissue density surrounding the abdominal aorta along most of its course, concerning for matted lymphadenopathy or surrounding mass. Significantly  enlarged mesenteric nodes seen, suspicious for a metastatic process. 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 4. Small bilateral pleural effusions noted. 5. Small to moderate volume ascites within the abdomen and pelvis. 6. Large 10.6 cm cyst within the right hepatic lobe. 7. Two small anterior abdominal wall hernias noted just to the right of midline, containing only fat and trace fluid. 8. Relatively diffuse calcification along the abdominal aorta and its branches; the abdominal aorta is not well assessed without contrast. 9. Diffuse soft tissue edema along the abdominal wall, compatible with mild anasarca. 10. Scattered diverticulosis along the descending and sigmoid colon, without evidence of diverticulitis. 11. 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   Electronically Signed   By: Garald Balding M.D.   On: 03/20/2014 03:22   Ir Nephrostomy Placement Right  03/20/2014   CLINICAL DATA:  Right ureteral obstruction.  Bladder mass.  EXAM: IR NEPHROSTOGRAM INI PLACEMENT RIGHT  FLUOROSCOPY TIME:  2 minutes and 30 seconds.  MEDICATIONS AND MEDICAL HISTORY: Versed 1 mg, Fentanyl 75 mcg.  Additional Medications: Ancef.  ANESTHESIA/SEDATION: Moderate sedation time: 30 minutes  CONTRAST:  10 cc Omnipaque 300  PROCEDURE: The procedure, risks,  benefits, and alternatives were explained to the patient. Questions regarding the procedure were encouraged and answered. The patient understands and consents to the procedure.  The back was prepped with Betadine in a sterile fashion, and a sterile drape was applied covering the operative field. A sterile gown and sterile gloves were used for the procedure.  Under sonographic guidance, a 21 gauge needle was inserted into a posterior lower pole calyx and removed over a 018 wire. This was up sized to a 3 J. A 10 French nephrostomy was advanced over the wire and looped in the renal  pelvis. Contrast was injected.  FINDINGS: Imaging confirms access into the right kidney via posterior lower pole calyx. Final image demonstrates a 10 French nephrostomy in place.  COMPLICATIONS: None  IMPRESSION: Successful right percutaneous nephrostomy catheter placement.   Electronically Signed   By: Marybelle Killings M.D.   On: 03/20/2014 15:08    Labs:  CBC:  Recent Labs  03/11/14 0537 03/19/14 1320 03/19/14 2304 03/20/14 0506  WBC 9.3 10.4 12.0* 11.6*  HGB 9.1* 8.8 Repeated and verified X2.* 9.0* 9.0*  HCT 27.6* 26.4 Repeated and verified X2.* 26.6* 26.3*  PLT 322 426.0* 421* 391    COAGS:  Recent Labs  03/20/14 0820  INR 1.08    BMP:  Recent Labs  03/11/14 0537 03/12/14 0840 03/19/14 1320 03/19/14 2304 03/20/14 0506  NA 136 135 131* 130* 131*  K 3.8 3.8 5.0 4.7 4.9  CL 107 106 101 97 97  CO2 22 25 22 22  17*  GLUCOSE 105* 99 80 88 78  BUN 29* 27* 56* 54* 56*  CALCIUM 8.2* 8.2* 8.1* 8.0* 8.0*  CREATININE 1.76* 1.82* 5.70* 6.01* 6.07*  GFRNONAA 29* 28*  --  7* 7*  GFRAA 34* 33*  --  8* 8*    LIVER FUNCTION TESTS:  Recent Labs  12/07/13 0855 02/10/14 1842 03/08/14 0301 03/19/14 1320 03/19/14 2304  BILITOT 0.5 0.4 0.6  --  0.4  AST 28 33 29  --  27  ALT 12 17 15   --  15  ALKPHOS 79 78 76  --  76  PROT 7.2 6.4 6.4  --  6.2  ALBUMIN 3.7 3.0* 2.9* 2.8* 2.7*    Assessment and Plan:  R PCN in place Will follow Cr pending for today  Signed: Vyctoria Dickman A 03/21/2014, 8:46 AM   I spent a total of 15 Minutes in face to face in clinical consultation/evaluation, greater than 50% of which was counseling/coordinating care for R PCN placement

## 2014-03-21 NOTE — Progress Notes (Addendum)
[facility] PROGRESS NOTE  Martha Patton UGQ:[id number] DOB: 12/21/1950 DOA: 03/19/2014 PCP: Loura Pardon, MD  Assessment/Plan: Principal Problem:   Acute kidney failure Active Problems:   HTN (hypertension)   E-coli UTI   Bladder mass   Periaortic lymphadenopathy   Solitary right kidney   History of renal carcinoma   Hydronephrosis of right kidney     Acute kidney failure- in setting of solitary kidney -Secondary to obstructive uropathy Status post nephrostomy tube placement on 3/1  Urine output of 252 5 mL in the last 24 hours Creatinine improving 6.07> 4.39  Active Problems:  HTN (hypertension) - resume Norvasc, PRN Labetalol   Periaortic lymphadenopathy vs mass Patient would need a biopsy She would like to continue her care with Dr. Edrick Oh in [place] Patient did go to SNF and get an outpatient biopsy We'll try to schedule follow-up on 3/7   Solitary right kidney   History of renal carcinoma - s/p left nephrectomy Now with right-sided hydronephrosis  And 4.4 x 4.2 x 3.3 cm mass noted arising at the right side of the bladder, concerning for primary malignancy  Bipolar 1 disorder/ Continue Abilify, BuSpar, Trileptal  Code Status: full Family Communication: family updated about patient's clinical progress Disposition Plan:  Anticipate discharge to SNF in one to 2 days   Brief narrative: Martha Patton is a 64 y.o. female who presents to the ED after a hospital discharge follow up with her PCP today. Labs drawn today demonstrate that her creatinine has gone up from 1.8 at discharge to 5.7 today at PCPs office. This is confirmed on ED labs.  Patient was admitted on 2/18 with severe sepsis, AKI with creatinine of 3.0 at that time. This due to HCAP and E.Coli UTI. Although initially I didn't think she was going to survive without intubation due to her profound respiratory failure (tachypnic to the upper 40s / 50s in the ED initially), she  thankfully managed to pull through. Her AKI improved and creatinine was down to 1.8 at the time of discharge. Although initially on Zosyn/Vanc for broad spectrum coverage, she was discharged on cipro only.  She has not taken any NSAIDS in the past week, only tylenol. Her ACEI, and diuretics were held during the admission, and were not restarted at the time of discharge according to both family and Dr. Doristine Devoid discharge summary.  She has had a 6 day history of worsening, ascending, peripheral edema as well. She does report RLQ abdominal / flank pain which has been ongoing for the past 6 days (family was not aware of this). Patient does have a solitary R kidney secondary to previous L nephrectomy for kidney cancer  Consultants:  Interventional radiology  Procedures: R PCN placed 3/1 in IR  Antibiotics: None  HPI/Subjective: Patient's pain seems to be controlled, good urine output overnight, no nausea no vomiting chest pain  Objective: Filed Vitals:   03/20/14 1422 03/20/14 1502 03/20/14 2100 03/21/14 0543  BP: 167/98 176/90 135/95 145/89  Pulse: 103 103 105 94  Temp:  98.1 F (36.7 C) 99.3 F (37.4 C) 97.5 F (36.4 C)  TempSrc:  Axillary Oral Oral  Resp: 18 20 18 17   Height:      Weight:   69.446 kg (153 lb 1.6 oz)   SpO2: 98% 98% 98% 99%    Intake/Output Summary (Last 24 hours) at 03/21/14 1314 Last data filed at 03/21/14 1224  Gross per 24 hour  Intake   1561 ml  Output  2675 ml  Net  -1114 ml    Exam:  General: No acute respiratory distress Lungs: Clear to auscultation bilaterally without wheezes or crackles Cardiovascular: Regular rate and rhythm without murmur gallop or rub normal S1 and S2 Abdomen: Nontender, nondistended, soft, bowel sounds positive, no rebound, no ascites, no appreciable mass Extremities: No significant cyanosis, clubbing, or edema bilateral lower extremities      Data Reviewed: Basic Metabolic Panel:  Recent Labs Lab  03/19/14 1320 03/19/14 2304 03/20/14 0506 03/21/14 1000  NA 131* 130* 131* 133*  K 5.0 4.7 4.9 4.4  CL 101 97 97 102  CO2 22 22 17* 23  GLUCOSE 80 88 78 152*  BUN 56* 54* 56* 48*  CREATININE 5.70* 6.01* 6.07* 4.39*  CALCIUM 8.1* 8.0* 8.0* 7.9*  PHOS 6.2*  --   --   --     Liver Function Tests:  Recent Labs Lab 03/19/14 1320 03/19/14 2304  AST  --  27  ALT  --  15  ALKPHOS  --  76  BILITOT  --  0.4  PROT  --  6.2  ALBUMIN 2.8* 2.7*    Recent Labs Lab 03/19/14 2304  LIPASE 70*   No results for input(s): AMMONIA in the last 168 hours.  CBC:  Recent Labs Lab 03/19/14 1320 03/19/14 2304 03/20/14 0506  WBC 10.4 12.0* 11.6*  NEUTROABS 9.3* 10.7*  --   HGB 8.8 Repeated and verified X2.* 9.0* 9.0*  HCT 26.4 Repeated and verified X2.* 26.6* 26.3*  MCV 88.3 87.5 88.6  PLT 426.0* 421* 391    Cardiac Enzymes: No results for input(s): CKTOTAL, CKMB, CKMBINDEX, TROPONINI in the last 168 hours. BNP (last 3 results)  Recent Labs  03/08/14 0301  BNP 67.6    ProBNP (last 3 results) No results for input(s): PROBNP in the last 8760 hours.    CBG: No results for input(s): GLUCAP in the last 168 hours.  Recent Results (from the past 240 hour(s))  Urine culture     Status: None   Collection Time: 03/19/14  1:20 PM  Result Value Ref Range Status   Colony Count NO GROWTH  Final   Organism ID, Bacteria NO GROWTH  Final  Blood culture (routine x 2)     Status: None (Preliminary result)   Collection Time: 03/19/14 11:04 PM  Result Value Ref Range Status   Specimen Description BLOOD LEFT WRIST  Final   Special Requests   Final    BOTTLES DRAWN AEROBIC AND ANAEROBIC 10CC AEROBIC 4CC ANAEROBIC   Culture   Final           BLOOD CULTURE RECEIVED NO GROWTH TO DATE CULTURE WILL BE HELD FOR 5 DAYS BEFORE ISSUING A FINAL NEGATIVE REPORT Performed at [facility]    Report Status PENDING  Incomplete  Blood culture (routine x 2)     Status: None (Preliminary  result)   Collection Time: 03/19/14 11:08 PM  Result Value Ref Range Status   Specimen Description BLOOD RIGHT HAND  Final   Special Requests BOTTLES DRAWN AEROBIC AND ANAEROBIC 5CC EACH  Final   Culture   Final           BLOOD CULTURE RECEIVED NO GROWTH TO DATE CULTURE WILL BE HELD FOR 5 DAYS BEFORE ISSUING A FINAL NEGATIVE REPORT Performed at [facility]    Report Status PENDING  Incomplete  Urine culture     Status: None   Collection Time: 03/19/14 11:17 PM  Result Value Ref Range Status   Specimen Description URINE, CATHETERIZED  Final   Special Requests ADDED 0240 03/20/14  Final   Colony Count NO GROWTH Performed at [facility]   Final   Culture NO GROWTH Performed at [facility]   Final   Report Status 03/21/2014 FINAL  Final  Clostridium Difficile by PCR     Status: None   Collection Time: 03/20/14  9:12 AM  Result Value Ref Range Status   C difficile by pcr NEGATIVE NEGATIVE Final     Studies: Ct Abdomen Pelvis Wo Contrast  03/20/2014   CLINICAL DATA:  Acute onset of suprapubic abdominal pain. Significantly worsening creatinine. Initial encounter.  EXAM: CT ABDOMEN AND PELVIS WITHOUT CONTRAST  TECHNIQUE: Multidetector CT imaging of the abdomen and pelvis was performed following the standard protocol without IV contrast.  COMPARISON:  None.  FINDINGS: Small bilateral pleural effusions are noted. Trace pericardial fluid is also seen, within normal limits.  Small to moderate volume ascites is seen along the abdomen and pelvis, demonstrating mild bulging along the left lateral abdominal wall.  There is a 10.6 cm cyst within the right hepatic lobe. The patient is status post cholecystectomy. The liver and spleen are grossly unremarkable in appearance, though the spleen is surrounded by fluid. The pancreas and adrenal glands are unremarkable.  Relatively severe chronic right-sided hydronephrosis is noted. This appears to be secondary to a right-sided bladder  mass. The left kidney has been resected, reflecting the patient's history of renal cell carcinoma. Right-sided perinephric stranding is noted. No renal or ureteral stones are seen.  The small bowel is unremarkable in appearance. The stomach is within normal limits.  The abdominal aorta is difficult to assess without contrast. Relatively diffuse calcification is seen along the abdominal aorta and its branches. There is diffuse soft tissue density surrounding the abdominal aorta along most of its course, concerning for matted lymphadenopathy or surrounding mass. Significantly enlarged mesenteric nodes are seen, suspicious for a metastatic process. This could conceivably reflect either the bladder mass or the patient's known history of renal cell carcinoma.  The patient is status post appendectomy. Scattered diverticulosis is noted along the descending and sigmoid colon, without evidence of diverticulitis. The colon is otherwise unremarkable.  The bladder is decompressed. There is an asymmetric 4.4 x 4.2 x 3.3 cm mass at the right side of the bladder, concerning for malignancy. This likely explains the patient's right-sided hydronephrosis. The patient is status post hysterectomy. No suspicious adnexal masses are seen. No inguinal lymphadenopathy is seen.  Two small anterior abdominal hernias are noted just to the right of midline, containing only fat and trace fluid.  Diffuse soft tissue edema is noted along the abdominal wall, compatible with mild anasarca.  No acute osseous abnormalities are identified. 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  IMPRESSION: 1. Relatively severe chronic right-sided hydronephrosis noted. This appears to be secondary to a right-sided bladder mass, as described below. Given prior left sided nephrectomy, this explains the patient's renal failure. 2. 4.4 x 4.2 x 3.3 cm mass noted arising at the right side of the bladder, concerning for primary  malignancy. 3. Diffuse soft tissue density surrounding the abdominal aorta along most of its course, concerning for matted lymphadenopathy or surrounding mass. Significantly enlarged mesenteric nodes seen, suspicious for a metastatic process. 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 4. Small bilateral pleural effusions noted.  5. Small to moderate volume ascites within the abdomen and pelvis. 6. Large 10.6 cm cyst within the right hepatic lobe. 7. Two small anterior abdominal wall hernias noted just to the right of midline, containing only fat and trace fluid. 8. Relatively diffuse calcification along the abdominal aorta and its branches; the abdominal aorta is not well assessed without contrast. 9. Diffuse soft tissue edema along the abdominal wall, compatible with mild anasarca. 10. Scattered diverticulosis along the descending and sigmoid colon, without evidence of diverticulitis. 11. 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   Electronically Signed   By: Garald Balding M.D.   On: 03/20/2014 03:22   Dg Chest Port 1 View  03/08/2014   CLINICAL DATA:  Dyspnea  EXAM: PORTABLE CHEST - 1 VIEW  COMPARISON:  02/10/2014  FINDINGS: There is left base consolidation, new from 02/10/2014. This could represent pneumonia. The right lung is clear. No large effusions are evident. Pulmonary vasculature is normal. Heart size is unchanged.  IMPRESSION: New left base consolidation. This could represent pneumonia. Follow-up radiography recommended to confirm complete clearing after treatment.   Electronically Signed   By: Andreas Newport M.D.   On: 03/08/2014 03:38   Ir Nephrostomy Placement Right  03/20/2014   CLINICAL DATA:  Right ureteral obstruction.  Bladder mass.  EXAM: IR NEPHROSTOGRAM INI PLACEMENT RIGHT  FLUOROSCOPY TIME:  2 minutes and 30 seconds.  MEDICATIONS AND MEDICAL HISTORY: Versed 1 mg, Fentanyl 75 mcg.   Additional Medications: Ancef.  ANESTHESIA/SEDATION: Moderate sedation time: 30 minutes  CONTRAST:  10 cc Omnipaque 300  PROCEDURE: The procedure, risks, benefits, and alternatives were explained to the patient. Questions regarding the procedure were encouraged and answered. The patient understands and consents to the procedure.  The back was prepped with Betadine in a sterile fashion, and a sterile drape was applied covering the operative field. A sterile gown and sterile gloves were used for the procedure.  Under sonographic guidance, a 21 gauge needle was inserted into a posterior lower pole calyx and removed over a 018 wire. This was up sized to a 3 J. A 10 French nephrostomy was advanced over the wire and looped in the renal pelvis. Contrast was injected.  FINDINGS: Imaging confirms access into the right kidney via posterior lower pole calyx. Final image demonstrates a 10 French nephrostomy in place.  COMPLICATIONS: None  IMPRESSION: Successful right percutaneous nephrostomy catheter placement.   Electronically Signed   By: Marybelle Killings M.D.   On: 03/20/2014 15:08    Scheduled Meds: . amLODipine  10 mg Oral Daily  . antiseptic oral rinse  7 mL Mouth Rinse BID  . ARIPiprazole  20 mg Oral QHS  . atorvastatin  10 mg Oral q1800  . buPROPion  150 mg Oral Daily  . busPIRone  7.5 mg Oral BID  . fluticasone  1 spray Each Nare BID  . heparin subcutaneous  5,000 Units Subcutaneous 3 times per day  . levothyroxine  150 mcg Oral QAC breakfast  . mometasone-formoterol  2 puff Inhalation BID  . oxcarbazepine  600 mg Oral BID  . tiotropium  18 mcg Inhalation Daily  . traZODone  150 mg Oral QHS   Continuous Infusions:   Principal Problem:   Acute kidney failure Active Problems:   HTN (hypertension)   E-coli UTI   Bladder mass   Periaortic lymphadenopathy   Solitary right kidney   History of renal carcinoma   Hydronephrosis of right kidney    Time  spent: 40 minutes   [place]  [facility] Pager [phone]. If 7PM-7AM, please contact night-coverage at [email or web address], password [facility] 03/21/2014, 1:14 PM  LOS: 1 day

## 2014-03-21 NOTE — Clinical Social Work Note (Signed)
CSW received consult for skilled facility placement. CSW completed assessment with patient and her husband and they are in agreement with short-term rehab. Full assessment to follow.  Ellis Mehaffey Givens, MSW, LCSW Licensed Clinical Social Worker [place] [phone]

## 2014-03-21 NOTE — Telephone Encounter (Signed)
Done and in IN box 

## 2014-03-21 NOTE — Progress Notes (Signed)
Patient wears CPAP at night at home and requested CPAP while in the hospital. Placed patient on CPAP via auto-mode with minimum pressure set at 4cm and maximum pressure set at 20cm. Oxygen set at 2lpm.

## 2014-03-21 NOTE — Telephone Encounter (Signed)
FMLA forms in your INbox for review, completion and signature.

## 2014-03-21 NOTE — Telephone Encounter (Signed)
Forms faxed 03/21/14.  LVM for [facility] letting her know forms were faxed and copy at front desk for her copy. Copy sent to scan and copy for my file.

## 2014-03-21 NOTE — Progress Notes (Signed)
Attempted to arrange f/u with Dr.Cope on 3/7. Per office, Dr. Jacqlyn Larsen has reviewed CT and notes and feels can see in 2-3 weeks. Office to call patient's husband to set up appointment.Husband notified.

## 2014-03-22 ENCOUNTER — Other Ambulatory Visit: Payer: Self-pay | Admitting: Radiology

## 2014-03-22 DIAGNOSIS — N1330 Unspecified hydronephrosis: Secondary | ICD-10-CM

## 2014-03-22 LAB — COMPREHENSIVE METABOLIC PANEL
ALBUMIN: 2.3 g/dL — AB (ref 3.5–5.2)
ALT: 49 U/L — ABNORMAL HIGH (ref 0–35)
AST: 121 U/L — AB (ref 0–37)
Alkaline Phosphatase: 241 U/L — ABNORMAL HIGH (ref 39–117)
Anion gap: 10 (ref 5–15)
BILIRUBIN TOTAL: 0.2 mg/dL — AB (ref 0.3–1.2)
BUN: 42 mg/dL — ABNORMAL HIGH (ref 6–23)
CHLORIDE: 103 mmol/L (ref 96–112)
CO2: 20 mmol/L (ref 19–32)
CREATININE: 3.07 mg/dL — AB (ref 0.50–1.10)
Calcium: 7.9 mg/dL — ABNORMAL LOW (ref 8.4–10.5)
GFR calc Af Amer: 17 mL/min — ABNORMAL LOW (ref >90)
GFR calc non Af Amer: 15 mL/min — ABNORMAL LOW (ref >90)
Glucose, Bld: 102 mg/dL — ABNORMAL HIGH (ref 70–99)
Potassium: 4.5 mmol/L (ref 3.5–5.1)
Sodium: 133 mmol/L — ABNORMAL LOW (ref 135–145)
Total Protein: 5.6 g/dL — ABNORMAL LOW (ref 6.0–8.3)

## 2014-03-22 LAB — CBC
HEMATOCRIT: 24.9 % — AB (ref 36.0–46.0)
HEMOGLOBIN: 8.4 g/dL — AB (ref 12.0–15.0)
MCH: 29.7 pg (ref 26.0–34.0)
MCHC: 33.7 g/dL (ref 30.0–36.0)
MCV: 88 fL (ref 78.0–100.0)
Platelets: 416 10*3/uL — ABNORMAL HIGH (ref 150–400)
RBC: 2.83 MIL/uL — AB (ref 3.87–5.11)
RDW: 15.3 % (ref 11.5–15.5)
WBC: 7 10*3/uL (ref 4.0–10.5)

## 2014-03-22 MED ORDER — LEVOTHYROXINE SODIUM 150 MCG PO TABS: 150.0000 ug | ORAL_TABLET | Freq: Every day | ORAL | Status: DC

## 2014-03-22 MED ORDER — CLONAZEPAM 0.5 MG PO TABS: 0.5000 mg | ORAL_TABLET | Freq: Two times a day (BID) | ORAL | Status: AC | PRN

## 2014-03-22 MED ORDER — BUPROPION HCL ER (XL) 150 MG PO TB24: 150.0000 mg | ORAL_TABLET | Freq: Every day | ORAL | Status: AC

## 2014-03-22 MED ORDER — OXYCODONE HCL 5 MG PO CAPS: 5.0000 mg | ORAL_CAPSULE | Freq: Four times a day (QID) | ORAL | Status: DC | PRN

## 2014-03-22 MED ORDER — BUSPIRONE HCL 7.5 MG PO TABS: 7.5000 mg | ORAL_TABLET | Freq: Two times a day (BID) | ORAL | Status: AC

## 2014-03-22 NOTE — Progress Notes (Signed)
Referring Physician(s): TRH  Subjective: Patient without complaints.   Allergies: Spearmint oil; Ezetimibe; Fluvastatin sodium; Naproxen; and Tape  Medications: Prior to Admission medications   Medication Sig Start Date End Date Taking? Authorizing Provider  Aclidinium Bromide (TUDORZA PRESSAIR) 400 MCG/ACT AEPB Inhale 400 mcg into the lungs every 12 (twelve) hours.   Yes Historical Provider, MD  albuterol (PROVENTIL,VENTOLIN) 90 MCG/ACT inhaler Inhale 2 puffs into the lungs every 6 (six) hours as needed. For shortness of breath   Yes Historical Provider, MD  amLODipine (NORVASC) 10 MG tablet Take 1 tablet (10 mg total) by mouth daily. 03/12/14  Yes Orson Eva, MD  ARIPiprazole (ABILIFY) 20 MG tablet Take 20 mg by mouth at bedtime.   Yes Historical Provider, MD  atorvastatin (LIPITOR) 10 MG tablet TAKE 1 TABLET (10 MG TOTAL) BY MOUTH DAILY. 09/21/13  Yes Abner Greenspan, MD  buPROPion (WELLBUTRIN XL) 300 MG 24 hr tablet Take 300 mg by mouth daily.    Yes Historical Provider, MD  busPIRone (BUSPAR) 15 MG tablet Take 15 mg by mouth 2 (two) times daily.     Yes Historical Provider, MD  ciprofloxacin (CIPRO) 500 MG tablet Take 1 tablet (500 mg total) by mouth daily. 03/12/14  Yes Orson Eva, MD  clonazePAM (KLONOPIN) 0.5 MG tablet Take 0.5 mg by mouth 2 (two) times daily as needed. For nerves   Yes Historical Provider, MD  fluticasone (FLONASE) 50 MCG/ACT nasal spray 1 spray by Nasal route 2 (two) times daily.     Yes Historical Provider, MD  Fluticasone-Salmeterol (ADVAIR DISKUS) 250-50 MCG/DOSE AEPB Inhale 2 puffs into the lungs 2 (two) times daily.    Yes Historical Provider, MD  levothyroxine (SYNTHROID, LEVOTHROID) 100 MCG tablet TAKE 1 TABLET (100 MCG TOTAL) BY MOUTH DAILY. 01/08/14  Yes Abner Greenspan, MD  Melatonin 3 MG TABS Takes 3-10 mg per night   Yes Historical Provider, MD  Multiple Vitamins-Minerals (CENTRUM SILVER PO) Take 1 tablet by mouth daily.    Yes Historical Provider, MD    oxcarbazepine (TRILEPTAL) 600 MG tablet Take 600 mg by mouth 2 (two) times daily.    Yes Historical Provider, MD  Tamsulosin HCl (FLOMAX) 0.4 MG CAPS Take 0.4 mg by mouth daily.    Yes Historical Provider, MD  traZODone (DESYREL) 100 MG tablet Take 150 mg by mouth at bedtime.    Yes Historical Provider, MD    Vital Signs: BP 165/91 mmHg  Pulse 93  Temp(Src) 98 F (36.7 C) (Axillary)  Resp 20  Ht 5' 0.5" (1.537 m)  Wt 154 lb 8.7 oz (70.1 kg)  BMI 29.67 kg/m2  SpO2 98%  LMP 01/20/2000  Physical Exam General: A&ox3, NAD Abd: Soft, NT, ND, (+) BS Right flank: PCN dressing C/D/I, NT, no signs of bleeding/hematoma, 24 hr output 1100 cc clear yellow urine  Imaging: Ct Abdomen Pelvis Wo Contrast  03/20/2014   CLINICAL DATA:  Acute onset of suprapubic abdominal pain. Significantly worsening creatinine. Initial encounter.  EXAM: CT ABDOMEN AND PELVIS WITHOUT CONTRAST  TECHNIQUE: Multidetector CT imaging of the abdomen and pelvis was performed following the standard protocol without IV contrast.  COMPARISON:  None.  FINDINGS: Small bilateral pleural effusions are noted. Trace pericardial fluid is also seen, within normal limits.  Small to moderate volume ascites is seen along the abdomen and pelvis, demonstrating mild bulging along the left lateral abdominal wall.  There is a 10.6 cm cyst within the right hepatic lobe. The patient is status post  cholecystectomy. The liver and spleen are grossly unremarkable in appearance, though the spleen is surrounded by fluid. The pancreas and adrenal glands are unremarkable.  Relatively severe chronic right-sided hydronephrosis is noted. This appears to be secondary to a right-sided bladder mass. The left kidney has been resected, reflecting the patient's history of renal cell carcinoma. Right-sided perinephric stranding is noted. No renal or ureteral stones are seen.  The small bowel is unremarkable in appearance. The stomach is within normal limits.  The  abdominal aorta is difficult to assess without contrast. Relatively diffuse calcification is seen along the abdominal aorta and its branches. There is diffuse soft tissue density surrounding the abdominal aorta along most of its course, concerning for matted lymphadenopathy or surrounding mass. Significantly enlarged mesenteric nodes are seen, suspicious for a metastatic process. This could conceivably reflect either the bladder mass or the patient's known history of renal cell carcinoma.  The patient is status post appendectomy. Scattered diverticulosis is noted along the descending and sigmoid colon, without evidence of diverticulitis. The colon is otherwise unremarkable.  The bladder is decompressed. There is an asymmetric 4.4 x 4.2 x 3.3 cm mass at the right side of the bladder, concerning for malignancy. This likely explains the patient's right-sided hydronephrosis. The patient is status post hysterectomy. No suspicious adnexal masses are seen. No inguinal lymphadenopathy is seen.  Two small anterior abdominal hernias are noted just to the right of midline, containing only fat and trace fluid.  Diffuse soft tissue edema is noted along the abdominal wall, compatible with mild anasarca.  No acute osseous abnormalities are identified. 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  IMPRESSION: 1. Relatively severe chronic right-sided hydronephrosis noted. This appears to be secondary to a right-sided bladder mass, as described below. Given prior left sided nephrectomy, this explains the patient's renal failure. 2. 4.4 x 4.2 x 3.3 cm mass noted arising at the right side of the bladder, concerning for primary malignancy. 3. Diffuse soft tissue density surrounding the abdominal aorta along most of its course, concerning for matted lymphadenopathy or surrounding mass. Significantly enlarged mesenteric nodes seen, suspicious for a metastatic process. Given the remote history of  renal cell carcinoma, this more likely reflects the primary bladder malignancy. 4. Small bilateral pleural effusions noted. 5. Small to moderate volume ascites within the abdomen and pelvis. 6. Large 10.6 cm cyst within the right hepatic lobe. 7. Two small anterior abdominal wall hernias noted just to the right of midline, containing only fat and trace fluid. 8. Relatively diffuse calcification along the abdominal aorta and its branches; the abdominal aorta is not well assessed without contrast. 9. Diffuse soft tissue edema along the abdominal wall, compatible with mild anasarca. 10. Scattered diverticulosis along the descending and sigmoid colon, without evidence of diverticulitis. 11. 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   Electronically Signed   By: Garald Balding M.D.   On: 03/20/2014 03:22   Ir Nephrostomy Placement Right  03/20/2014   CLINICAL DATA:  Right ureteral obstruction.  Bladder mass.  EXAM: IR NEPHROSTOGRAM INI PLACEMENT RIGHT  FLUOROSCOPY TIME:  2 minutes and 30 seconds.  MEDICATIONS AND MEDICAL HISTORY: Versed 1 mg, Fentanyl 75 mcg.  Additional Medications: Ancef.  ANESTHESIA/SEDATION: Moderate sedation time: 30 minutes  CONTRAST:  10 cc Omnipaque 300  PROCEDURE: The procedure, risks, benefits, and alternatives were explained to the patient. Questions regarding the procedure were encouraged and answered. The patient understands and consents to the  procedure.  The back was prepped with Betadine in a sterile fashion, and a sterile drape was applied covering the operative field. A sterile gown and sterile gloves were used for the procedure.  Under sonographic guidance, a 21 gauge needle was inserted into a posterior lower pole calyx and removed over a 018 wire. This was up sized to a 3 J. A 10 French nephrostomy was advanced over the wire and looped in the renal pelvis. Contrast was injected.  FINDINGS: Imaging confirms access into the right kidney via posterior  lower pole calyx. Final image demonstrates a 10 French nephrostomy in place.  COMPLICATIONS: None  IMPRESSION: Successful right percutaneous nephrostomy catheter placement.   Electronically Signed   By: Marybelle Killings M.D.   On: 03/20/2014 15:08    Labs:  CBC:  Recent Labs  03/19/14 1320 03/19/14 2304 03/20/14 0506 03/22/14 0345  WBC 10.4 12.0* 11.6* 7.0  HGB 8.8 Repeated and verified X2.* 9.0* 9.0* 8.4*  HCT 26.4 Repeated and verified X2.* 26.6* 26.3* 24.9*  PLT 426.0* 421* 391 416*    COAGS:  Recent Labs  03/20/14 0820  INR 1.08    BMP:  Recent Labs  03/19/14 2304 03/20/14 0506 03/21/14 1000 03/22/14 0345  NA 130* 131* 133* 133*  K 4.7 4.9 4.4 4.5  CL 97 97 102 103  CO2 22 17* 23 20  GLUCOSE 88 78 152* 102*  BUN 54* 56* 48* 42*  CALCIUM 8.0* 8.0* 7.9* 7.9*  CREATININE 6.01* 6.07* 4.39* 3.07*  GFRNONAA 7* 7* 10* 15*  GFRAA 8* 8* 11* 17*    LIVER FUNCTION TESTS:  Recent Labs  02/10/14 1842 03/08/14 0301 03/19/14 1320 03/19/14 2304 03/22/14 0345  BILITOT 0.4 0.6  --  0.4 0.2*  AST 33 29  --  27 121*  ALT 17 15  --  15 49*  ALKPHOS 78 76  --  76 241*  PROT 6.4 6.4  --  6.2 5.6*  ALBUMIN 3.0* 2.9* 2.8* 2.7* 2.3*    Assessment and Plan: Right hydronephrosis secondary to Bladder mass Acute renal failure S/p right PCN in IR 3/1, output good clear yellow, Cr trending down 3 (4.39) History of kidney cancer s/p left nephrectomy Patient to F/U with her Urologist Dr. Edrick Oh in 2-3 weeks Will need 6-8 routine exchanges if PCN left in place, order placed.    SignedHedy Jacob 03/22/2014, 8:29 AM   I spent a total of 15 Minutes in face to face in clinical consultation/evaluation, greater than 50% of which was counseling/coordinating care for Right hydronephrosis.

## 2014-03-22 NOTE — Progress Notes (Signed)
OT Cancellation Note  Patient Details Name: Martha Patton MRN: [id number] DOB: [birth year]   Cancelled Treatment:    Reason Eval/Treat Not Completed: Fatigue/lethargy limiting ability to participate Spoke with husband and said pt had a busy morning  [place], Thereasa Parkin 03/22/2014, 1:08 PM

## 2014-03-22 NOTE — Progress Notes (Signed)
[facility] PROGRESS NOTE  Martha Patton IFO:[id number] DOB: [birth year] DOA: 03/19/2014 PCP: Martha Pardon, MD  Assessment/Plan: Principal Problem:   Acute kidney failure Active Problems:   HTN (hypertension)   E-coli UTI   Bladder mass   Periaortic lymphadenopathy   Solitary right kidney   History of renal carcinoma   Hydronephrosis of right kidney     Acute kidney failure- in setting of solitary kidney-improving -Secondary to obstructive uropathy Status post nephrostomy tube placement on 3/1  Urine output of 252 5 mL in the last 24 hours Creatinine improving 6.07> 4.39> 3.07 F/U with her Urologist Martha Patton in 2-3 weeks Will need 6-8 routine exchanges if PCN left in place,    HTN (hypertension) Continue Norvasc, PRN Labetalol   Periaortic lymphadenopathy vs mass Patient would need a biopsy Follow-up with Martha Patton in [place], he will call patient for appointment Patient did go to SNF 3/4 and get an outpatient biopsy     Solitary right kidney   History of renal carcinoma - s/p left nephrectomy Now with right-sided hydronephrosis  And 4.4 x 4.2 x 3.3 cm mass noted arising at the right side of the bladder, concerning for primary malignancy  Bipolar 1 disorder/ Continue Abilify, BuSpar, Trileptal  Code Status: full Family Communication: family updated about patient's clinical progress Disposition Plan:  Anticipate discharge to SNF 3/4   Brief narrative: Martha Patton is a 64 y.o. female who presents to the ED after a hospital discharge follow up with her PCP today. Labs drawn today demonstrate that her creatinine has gone up from 1.8 at discharge to 5.7 today at PCPs office. This is confirmed on ED labs.  Patient was admitted on 2/18 with severe sepsis, AKI with creatinine of 3.0 at that time. This due to HCAP and E.Coli UTI. Although initially I didn't think she was going to survive without intubation due to her profound respiratory  failure (tachypnic to the upper 40s / 50s in the ED initially), she thankfully managed to pull through. Her AKI improved and creatinine was down to 1.8 at the time of discharge. Although initially on Zosyn/Vanc for broad spectrum coverage, she was discharged on cipro only.  She has not taken any NSAIDS in the past week, only tylenol. Her ACEI, and diuretics were held during the admission, and were not restarted at the time of discharge according to both family and Martha Patton discharge summary.  She has had a 6 day history of worsening, ascending, peripheral edema as well. She does report RLQ abdominal / flank pain which has been ongoing for the past 6 days (family was not aware of this). Patient does have a solitary R kidney secondary to previous L nephrectomy for kidney cancer  Consultants:  Interventional radiology  Procedures: R PCN placed 3/1 in IR  Antibiotics: None  HPI/Subjective: Pain seems to be controlled, sitting in bed and having lunch, no complaints  Objective: Filed Vitals:   03/21/14 1925 03/21/14 2105 03/22/14 0441 03/22/14 0900  BP:  136/76 165/91 140/80  Pulse:  90 93 88  Temp:  98.2 F (36.8 C) 98 F (36.7 C) 98.4 F (36.9 C)  TempSrc:  Oral Axillary Oral  Resp:  20 20 20   Height:      Weight:  70.1 kg (154 lb 8.7 oz)    SpO2: 97% 97% 98% 99%    Intake/Output Summary (Last 24 hours) at 03/22/14 1430 Last data filed at 03/22/14 1100  Gross per 24 hour  Intake    840 ml  Output   1300 ml  Net   -460 ml    Exam:  General: No acute respiratory distress Lungs: Clear to auscultation bilaterally without wheezes or crackles Cardiovascular: Regular rate and rhythm without murmur gallop or rub normal S1 and S2 Abdomen: Nontender, nondistended, soft, bowel sounds positive, no rebound, no ascites, no appreciable mass Extremities: No significant cyanosis, clubbing, or edema bilateral lower extremities      Data Reviewed: Basic Metabolic  Panel:  Recent Labs Lab 03/19/14 1320 03/19/14 2304 03/20/14 0506 03/21/14 1000 03/22/14 0345  NA 131* 130* 131* 133* 133*  K 5.0 4.7 4.9 4.4 4.5  CL 101 97 97 102 103  CO2 22 22 17* 23 20  GLUCOSE 80 88 78 152* 102*  BUN 56* 54* 56* 48* 42*  CREATININE 5.70* 6.01* 6.07* 4.39* 3.07*  CALCIUM 8.1* 8.0* 8.0* 7.9* 7.9*  PHOS 6.2*  --   --   --   --     Liver Function Tests:  Recent Labs Lab 03/19/14 1320 03/19/14 2304 03/22/14 0345  AST  --  27 121*  ALT  --  15 49*  ALKPHOS  --  76 241*  BILITOT  --  0.4 0.2*  PROT  --  6.2 5.6*  ALBUMIN 2.8* 2.7* 2.3*    Recent Labs Lab 03/19/14 2304  LIPASE 70*   No results for input(s): AMMONIA in the last 168 hours.  CBC:  Recent Labs Lab 03/19/14 1320 03/19/14 2304 03/20/14 0506 03/22/14 0345  WBC 10.4 12.0* 11.6* 7.0  NEUTROABS 9.3* 10.7*  --   --   HGB 8.8 Repeated and verified X2.* 9.0* 9.0* 8.4*  HCT 26.4 Repeated and verified X2.* 26.6* 26.3* 24.9*  MCV 88.3 87.5 88.6 88.0  PLT 426.0* 421* 391 416*    Cardiac Enzymes: No results for input(s): CKTOTAL, CKMB, CKMBINDEX, TROPONINI in the last 168 hours. BNP (last 3 results)  Recent Labs  03/08/14 0301  BNP 67.6    ProBNP (last 3 results) No results for input(s): PROBNP in the last 8760 hours.    CBG: No results for input(s): GLUCAP in the last 168 hours.  Recent Results (from the past 240 hour(s))  Urine culture     Status: None   Collection Time: 03/19/14  1:20 PM  Result Value Ref Range Status   Colony Count NO GROWTH  Final   Organism ID, Bacteria NO GROWTH  Final  Blood culture (routine x 2)     Status: None (Preliminary result)   Collection Time: 03/19/14 11:04 PM  Result Value Ref Range Status   Specimen Description BLOOD LEFT WRIST  Final   Special Requests   Final    BOTTLES DRAWN AEROBIC AND ANAEROBIC 10CC AEROBIC 4CC ANAEROBIC   Culture   Final           BLOOD CULTURE RECEIVED NO GROWTH TO DATE CULTURE WILL BE HELD FOR 5 DAYS  BEFORE ISSUING A FINAL NEGATIVE REPORT Performed at [facility]    Report Status PENDING  Incomplete  Blood culture (routine x 2)     Status: None (Preliminary result)   Collection Time: 03/19/14 11:08 PM  Result Value Ref Range Status   Specimen Description BLOOD RIGHT HAND  Final   Special Requests BOTTLES DRAWN AEROBIC AND ANAEROBIC 5CC EACH  Final   Culture   Final           BLOOD CULTURE RECEIVED NO GROWTH TO DATE CULTURE  WILL BE HELD FOR 5 DAYS BEFORE ISSUING A FINAL NEGATIVE REPORT Performed at [facility]    Report Status PENDING  Incomplete  Urine culture     Status: None   Collection Time: 03/19/14 11:17 PM  Result Value Ref Range Status   Specimen Description URINE, CATHETERIZED  Final   Special Requests ADDED 0240 03/20/14  Final   Colony Count NO GROWTH Performed at [facility]   Final   Culture NO GROWTH Performed at [facility]   Final   Report Status 03/21/2014 FINAL  Final  Clostridium Difficile by PCR     Status: None   Collection Time: 03/20/14  9:12 AM  Result Value Ref Range Status   C difficile by pcr NEGATIVE NEGATIVE Final     Studies: Ct Abdomen Pelvis Wo Contrast  03/20/2014   CLINICAL DATA:  Acute onset of suprapubic abdominal pain. Significantly worsening creatinine. Initial encounter.  EXAM: CT ABDOMEN AND PELVIS WITHOUT CONTRAST  TECHNIQUE: Multidetector CT imaging of the abdomen and pelvis was performed following the standard protocol without IV contrast.  COMPARISON:  None.  FINDINGS: Small bilateral pleural effusions are noted. Trace pericardial fluid is also seen, within normal limits.  Small to moderate volume ascites is seen along the abdomen and pelvis, demonstrating mild bulging along the left lateral abdominal wall.  There is a 10.6 cm cyst within the right hepatic lobe. The patient is status post cholecystectomy. The liver and spleen are grossly unremarkable in appearance, though the spleen is surrounded by  fluid. The pancreas and adrenal glands are unremarkable.  Relatively severe chronic right-sided hydronephrosis is noted. This appears to be secondary to a right-sided bladder mass. The left kidney has been resected, reflecting the patient's history of renal cell carcinoma. Right-sided perinephric stranding is noted. No renal or ureteral stones are seen.  The small bowel is unremarkable in appearance. The stomach is within normal limits.  The abdominal aorta is difficult to assess without contrast. Relatively diffuse calcification is seen along the abdominal aorta and its branches. There is diffuse soft tissue density surrounding the abdominal aorta along most of its course, concerning for matted lymphadenopathy or surrounding mass. Significantly enlarged mesenteric nodes are seen, suspicious for a metastatic process. This could conceivably reflect either the bladder mass or the patient's known history of renal cell carcinoma.  The patient is status post appendectomy. Scattered diverticulosis is noted along the descending and sigmoid colon, without evidence of diverticulitis. The colon is otherwise unremarkable.  The bladder is decompressed. There is an asymmetric 4.4 x 4.2 x 3.3 cm mass at the right side of the bladder, concerning for malignancy. This likely explains the patient's right-sided hydronephrosis. The patient is status post hysterectomy. No suspicious adnexal masses are seen. No inguinal lymphadenopathy is seen.  Two small anterior abdominal hernias are noted just to the right of midline, containing only fat and trace fluid.  Diffuse soft tissue edema is noted along the abdominal wall, compatible with mild anasarca.  No acute osseous abnormalities are identified. 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  IMPRESSION: 1. Relatively severe chronic right-sided hydronephrosis noted. This appears to be secondary to a right-sided bladder mass, as described below.  Given prior left sided nephrectomy, this explains the patient's renal failure. 2. 4.4 x 4.2 x 3.3 cm mass noted arising at the right side of the bladder, concerning for primary malignancy. 3. Diffuse soft tissue density surrounding the abdominal aorta along most  of its course, concerning for matted lymphadenopathy or surrounding mass. Significantly enlarged mesenteric nodes seen, suspicious for a metastatic process. 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 4. Small bilateral pleural effusions noted. 5. Small to moderate volume ascites within the abdomen and pelvis. 6. Large 10.6 cm cyst within the right hepatic lobe. 7. Two small anterior abdominal wall hernias noted just to the right of midline, containing only fat and trace fluid. 8. Relatively diffuse calcification along the abdominal aorta and its branches; the abdominal aorta is not well assessed without contrast. 9. Diffuse soft tissue edema along the abdominal wall, compatible with mild anasarca. 10. Scattered diverticulosis along the descending and sigmoid colon, without evidence of diverticulitis. 11. 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   Electronically Signed   By: Garald Balding M.D.   On: 03/20/2014 03:22   Dg Chest Port 1 View  03/08/2014   CLINICAL DATA:  Dyspnea  EXAM: PORTABLE CHEST - 1 VIEW  COMPARISON:  02/10/2014  FINDINGS: There is left base consolidation, new from 02/10/2014. This could represent pneumonia. The right lung is clear. No large effusions are evident. Pulmonary vasculature is normal. Heart size is unchanged.  IMPRESSION: New left base consolidation. This could represent pneumonia. Follow-up radiography recommended to confirm complete clearing after treatment.   Electronically Signed   By: Andreas Newport M.D.   On: 03/08/2014 03:38   Ir Nephrostomy Placement Right  03/20/2014   CLINICAL DATA:  Right ureteral obstruction.   Bladder mass.  EXAM: IR NEPHROSTOGRAM INI PLACEMENT RIGHT  FLUOROSCOPY TIME:  2 minutes and 30 seconds.  MEDICATIONS AND MEDICAL HISTORY: Versed 1 mg, Fentanyl 75 mcg.  Additional Medications: Ancef.  ANESTHESIA/SEDATION: Moderate sedation time: 30 minutes  CONTRAST:  10 cc Omnipaque 300  PROCEDURE: The procedure, risks, benefits, and alternatives were explained to the patient. Questions regarding the procedure were encouraged and answered. The patient understands and consents to the procedure.  The back was prepped with Betadine in a sterile fashion, and a sterile drape was applied covering the operative field. A sterile gown and sterile gloves were used for the procedure.  Under sonographic guidance, a 21 gauge needle was inserted into a posterior lower pole calyx and removed over a 018 wire. This was up sized to a 3 J. A 10 French nephrostomy was advanced over the wire and looped in the renal pelvis. Contrast was injected.  FINDINGS: Imaging confirms access into the right kidney via posterior lower pole calyx. Final image demonstrates a 10 French nephrostomy in place.  COMPLICATIONS: None  IMPRESSION: Successful right percutaneous nephrostomy catheter placement.   Electronically Signed   By: Marybelle Killings M.D.   On: 03/20/2014 15:08    Scheduled Meds: . amLODipine  10 mg Oral Daily  . antiseptic oral rinse  7 mL Mouth Rinse BID  . ARIPiprazole  20 mg Oral QHS  . atorvastatin  10 mg Oral q1800  . buPROPion  150 mg Oral Daily  . busPIRone  7.5 mg Oral BID  . fluticasone  1 spray Each Nare BID  . heparin subcutaneous  5,000 Units Subcutaneous 3 times per day  . levothyroxine  150 mcg Oral QAC breakfast  . mometasone-formoterol  2 puff Inhalation BID  . oxcarbazepine  600 mg Oral BID  . tiotropium  18 mcg Inhalation Daily  . traZODone  150 mg Oral QHS   Continuous Infusions:   Principal Problem:  Acute kidney failure Active Problems:   HTN (hypertension)   E-coli UTI   Bladder mass    Periaortic lymphadenopathy   Solitary right kidney   History of renal carcinoma   Hydronephrosis of right kidney    Time spent: 40 minutes   [facility] Pager [phone]. If 7PM-7AM, please contact night-coverage at [email or web address], password [facility] 03/22/2014, 2:30 PM  LOS: 2 days

## 2014-03-22 NOTE — Progress Notes (Signed)
Physical Therapy Treatment Patient Details Name: Martha Patton MRN: [id number] DOB: 1950-01-31 Today's Date: 03/22/2014    History of Present Illness Pt is a 63 y.o. female who presents to the ED after a hospital discharge follow up with her PCP today.Labs drawn today demonstrate that her creatinine has gone up from 1.8 at discharge to 5.7 today at PCPs office.She has had a 6 day history of worsening, ascending, peripheral edema as well.She does report RLQ abdominal / flank pain which has been ongoing for the past 6 days (family was not aware of this).Patient does have a solitary R kidney secondary to previous L nephrectomy for kidney cancer.    PT Comments    Pt progressing towards physical therapy goals. Was able to improve ambulation distance this session however continues to require assist for support and walker direction. Continue to recommend SNF at d/c for continued therapy.   Follow Up Recommendations  SNF;Supervision/Assistance - 24 hour     Equipment Recommendations  None recommended by PT    Recommendations for Other Services       Precautions / Restrictions Precautions Precautions: Fall Restrictions Weight Bearing Restrictions: No    Mobility  Bed Mobility Overal bed mobility: Needs Assistance Bed Mobility: Supine to Sit     Supine to sit: Supervision     General bed mobility comments: Supervision for safety when transitioning to EOB. Use of bed rails for support.   Transfers Overall transfer level: Needs assistance Equipment used: Rolling walker (2 wheeled) Transfers: Sit to/from Stand Sit to Stand: Min guard         General transfer comment: Close guard for safety. Pt stands without warning at times.  Ambulation/Gait Ambulation/Gait assistance: Min assist Ambulation Distance (Feet): 200 Feet Assistive device: Rolling walker (2 wheeled) Gait Pattern/deviations: Step-through pattern;Decreased stride length;Trunk flexed Gait velocity:  Decreased Gait velocity interpretation: Below normal speed for age/gender General Gait Details: Assist for direction - need to be in front of patient for cueing as pt lip reads. Assist required for managing walker.    Stairs            Wheelchair Mobility    Modified Rankin (Stroke Patients Only)       Balance Overall balance assessment: Needs assistance Sitting-balance support: Feet supported;No upper extremity supported Sitting balance-Leahy Scale: Fair     Standing balance support: No upper extremity supported;During functional activity Standing balance-Leahy Scale: Poor                      Cognition Arousal/Alertness: Awake/alert Behavior During Therapy: WFL for tasks assessed/performed Overall Cognitive Status: Within Functional Limits for tasks assessed                      Exercises      General Comments        Pertinent Vitals/Pain Pain Assessment: No/denies pain    Home Living                      Prior Function            PT Goals (current goals can now be found in the care plan section) Acute Rehab PT Goals Patient Stated Goal: Home after rehab PT Goal Formulation: With patient/family Time For Goal Achievement: 04/03/14 Potential to Achieve Goals: Good Progress towards PT goals: Progressing toward goals    Frequency  Min 2X/week    PT Plan Current plan remains appropriate    Co-evaluation  End of Session Equipment Utilized During Treatment: Gait belt;Oxygen Activity Tolerance: Patient limited by fatigue Patient left: with family/visitor present;in chair;with call bell/phone within reach     Time: 0848-0905 PT Time Calculation (min) (ACUTE ONLY): 17 min  Charges:  $Gait Training: 8-22 mins                    G Codes:      Rolinda Roan 04-19-14, 10:54 AM  Rolinda Roan, PT, DPT Acute Rehabilitation Services Pager: [phone]

## 2014-03-23 LAB — COMPREHENSIVE METABOLIC PANEL
ALK PHOS: 448 U/L — AB (ref 39–117)
ALT: 186 U/L — AB (ref 0–35)
AST: 426 U/L — ABNORMAL HIGH (ref 0–37)
Albumin: 2.4 g/dL — ABNORMAL LOW (ref 3.5–5.2)
Anion gap: 12 (ref 5–15)
BILIRUBIN TOTAL: 0.6 mg/dL (ref 0.3–1.2)
BUN: 32 mg/dL — AB (ref 6–23)
CHLORIDE: 101 mmol/L (ref 96–112)
CO2: 18 mmol/L — ABNORMAL LOW (ref 19–32)
Calcium: 8 mg/dL — ABNORMAL LOW (ref 8.4–10.5)
Creatinine, Ser: 2.19 mg/dL — ABNORMAL HIGH (ref 0.50–1.10)
GFR calc Af Amer: 26 mL/min — ABNORMAL LOW (ref >90)
GFR calc non Af Amer: 23 mL/min — ABNORMAL LOW (ref >90)
Glucose, Bld: 86 mg/dL (ref 70–99)
Potassium: 4.1 mmol/L (ref 3.5–5.1)
Sodium: 131 mmol/L — ABNORMAL LOW (ref 135–145)
Total Protein: 5.7 g/dL — ABNORMAL LOW (ref 6.0–8.3)

## 2014-03-23 LAB — CBC
HCT: 24.8 % — ABNORMAL LOW (ref 36.0–46.0)
HEMOGLOBIN: 8.3 g/dL — AB (ref 12.0–15.0)
MCH: 29.6 pg (ref 26.0–34.0)
MCHC: 33.5 g/dL (ref 30.0–36.0)
MCV: 88.6 fL (ref 78.0–100.0)
Platelets: 418 10*3/uL — ABNORMAL HIGH (ref 150–400)
RBC: 2.8 MIL/uL — ABNORMAL LOW (ref 3.87–5.11)
RDW: 15.3 % (ref 11.5–15.5)
WBC: 6.7 10*3/uL (ref 4.0–10.5)

## 2014-03-23 NOTE — Discharge Summary (Addendum)
Physician Discharge Summary  Martha Patton MRN: [id number] DOB/AGE: March 26, 1950 64 y.o.  PCP: Loura Pardon, MD   Admit date: 03/19/2014 Discharge date: 03/23/2014  Discharge Diagnoses:      Acute kidney failure Active Problems:   HTN (hypertension)   E-coli UTI   Bladder mass   Periaortic lymphadenopathy   Solitary right kidney   History of renal carcinoma   Hydronephrosis of right kidney  Follow-up recommendations Follow-up with urology next Thursday Follow-up CBC, CMP once a week     Medication List    STOP taking these medications        ciprofloxacin 500 MG tablet  Commonly known as:  CIPRO     Melatonin 3 MG Tabs      TAKE these medications        ADVAIR DISKUS 250-50 MCG/DOSE Aepb  Generic drug:  Fluticasone-Salmeterol  Inhale 2 puffs into the lungs 2 (two) times daily.     albuterol 90 MCG/ACT inhaler  Commonly known as:  PROVENTIL,VENTOLIN  Inhale 2 puffs into the lungs every 6 (six) hours as needed. For shortness of breath     amLODipine 10 MG tablet  Commonly known as:  NORVASC  Take 1 tablet (10 mg total) by mouth daily.     ARIPiprazole 20 MG tablet  Commonly known as:  ABILIFY  Take 20 mg by mouth at bedtime.     atorvastatin 10 MG tablet  Commonly known as:  LIPITOR  TAKE 1 TABLET (10 MG TOTAL) BY MOUTH DAILY.     buPROPion 150 MG 24 hr tablet  Commonly known as:  WELLBUTRIN XL  Take 1 tablet (150 mg total) by mouth daily.     busPIRone 7.5 MG tablet  Commonly known as:  BUSPAR  Take 1 tablet (7.5 mg total) by mouth 2 (two) times daily.     CENTRUM SILVER PO  Take 1 tablet by mouth daily.     clonazePAM 0.5 MG tablet  Commonly known as:  KLONOPIN  Take 1 tablet (0.5 mg total) by mouth 2 (two) times daily as needed. For nerves     FLOMAX 0.4 MG Caps capsule  Generic drug:  tamsulosin  Take 0.4 mg by mouth daily.     fluticasone 50 MCG/ACT nasal spray  Commonly known as:  FLONASE  1 spray by Nasal route 2 (two) times daily.      levothyroxine 100 MCG tablet  Commonly known as:  SYNTHROID, LEVOTHROID  TAKE 1 TABLET (100 MCG TOTAL) BY MOUTH DAILY.               oxycodone 5 MG capsule  Commonly known as:  OXY-IR  Take 1 capsule (5 mg total) by mouth every 6 (six) hours as needed.     traZODone 100 MG tablet  Commonly known as:  DESYREL  Take 150 mg by mouth at bedtime.     TRILEPTAL 600 MG tablet  Generic drug:  oxcarbazepine  Take 600 mg by mouth 2 (two) times daily.     TUDORZA PRESSAIR 400 MCG/ACT Aepb  Generic drug:  Aclidinium Bromide  Inhale 400 mcg into the lungs every 12 (twelve) hours.        Discharge Condition: *Stable   Disposition: 06-Home-Health Care Svc   Consults:  None    Significant Diagnostic Studies: Ct Abdomen Pelvis Wo Contrast  03/20/2014   CLINICAL DATA:  Acute onset of suprapubic abdominal pain. Significantly worsening creatinine. Initial encounter.  EXAM: CT ABDOMEN AND PELVIS  WITHOUT CONTRAST  TECHNIQUE: Multidetector CT imaging of the abdomen and pelvis was performed following the standard protocol without IV contrast.  COMPARISON:  None.  FINDINGS: Small bilateral pleural effusions are noted. Trace pericardial fluid is also seen, within normal limits.  Small to moderate volume ascites is seen along the abdomen and pelvis, demonstrating mild bulging along the left lateral abdominal wall.  There is a 10.6 cm cyst within the right hepatic lobe. The patient is status post cholecystectomy. The liver and spleen are grossly unremarkable in appearance, though the spleen is surrounded by fluid. The pancreas and adrenal glands are unremarkable.  Relatively severe chronic right-sided hydronephrosis is noted. This appears to be secondary to a right-sided bladder mass. The left kidney has been resected, reflecting the patient's history of renal cell carcinoma. Right-sided perinephric stranding is noted. No renal or ureteral stones are seen.  The small bowel is unremarkable in  appearance. The stomach is within normal limits.  The abdominal aorta is difficult to assess without contrast. Relatively diffuse calcification is seen along the abdominal aorta and its branches. There is diffuse soft tissue density surrounding the abdominal aorta along most of its course, concerning for matted lymphadenopathy or surrounding mass. Significantly enlarged mesenteric nodes are seen, suspicious for a metastatic process. This could conceivably reflect either the bladder mass or the patient's known history of renal cell carcinoma.  The patient is status post appendectomy. Scattered diverticulosis is noted along the descending and sigmoid colon, without evidence of diverticulitis. The colon is otherwise unremarkable.  The bladder is decompressed. There is an asymmetric 4.4 x 4.2 x 3.3 cm mass at the right side of the bladder, concerning for malignancy. This likely explains the patient's right-sided hydronephrosis. The patient is status post hysterectomy. No suspicious adnexal masses are seen. No inguinal lymphadenopathy is seen.  Two small anterior abdominal hernias are noted just to the right of midline, containing only fat and trace fluid.  Diffuse soft tissue edema is noted along the abdominal wall, compatible with mild anasarca.  No acute osseous abnormalities are identified. 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  IMPRESSION: 1. Relatively severe chronic right-sided hydronephrosis noted. This appears to be secondary to a right-sided bladder mass, as described below. Given prior left sided nephrectomy, this explains the patient's renal failure. 2. 4.4 x 4.2 x 3.3 cm mass noted arising at the right side of the bladder, concerning for primary malignancy. 3. Diffuse soft tissue density surrounding the abdominal aorta along most of its course, concerning for matted lymphadenopathy or surrounding mass. Significantly enlarged mesenteric nodes seen, suspicious  for a metastatic process. 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 4. Small bilateral pleural effusions noted. 5. Small to moderate volume ascites within the abdomen and pelvis. 6. Large 10.6 cm cyst within the right hepatic lobe. 7. Two small anterior abdominal wall hernias noted just to the right of midline, containing only fat and trace fluid. 8. Relatively diffuse calcification along the abdominal aorta and its branches; the abdominal aorta is not well assessed without contrast. 9. Diffuse soft tissue edema along the abdominal wall, compatible with mild anasarca. 10. Scattered diverticulosis along the descending and sigmoid colon, without evidence of diverticulitis. 11. 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   Electronically Signed   By: Garald Balding M.D.   On: 03/20/2014 03:22   Dg Chest Port 1 View  03/08/2014  CLINICAL DATA:  Dyspnea  EXAM: PORTABLE CHEST - 1 VIEW  COMPARISON:  02/10/2014  FINDINGS: There is left base consolidation, new from 02/10/2014. This could represent pneumonia. The right lung is clear. No large effusions are evident. Pulmonary vasculature is normal. Heart size is unchanged.  IMPRESSION: New left base consolidation. This could represent pneumonia. Follow-up radiography recommended to confirm complete clearing after treatment.   Electronically Signed   By: Andreas Newport M.D.   On: 03/08/2014 03:38   Ir Nephrostomy Placement Right  03/20/2014   CLINICAL DATA:  Right ureteral obstruction.  Bladder mass.  EXAM: IR NEPHROSTOGRAM INI PLACEMENT RIGHT  FLUOROSCOPY TIME:  2 minutes and 30 seconds.  MEDICATIONS AND MEDICAL HISTORY: Versed 1 mg, Fentanyl 75 mcg.  Additional Medications: Ancef.  ANESTHESIA/SEDATION: Moderate sedation time: 30 minutes  CONTRAST:  10 cc Omnipaque 300  PROCEDURE: The procedure, risks, benefits, and alternatives were explained to the patient. Questions  regarding the procedure were encouraged and answered. The patient understands and consents to the procedure.  The back was prepped with Betadine in a sterile fashion, and a sterile drape was applied covering the operative field. A sterile gown and sterile gloves were used for the procedure.  Under sonographic guidance, a 21 gauge needle was inserted into a posterior lower pole calyx and removed over a 018 wire. This was up sized to a 3 J. A 10 French nephrostomy was advanced over the wire and looped in the renal pelvis. Contrast was injected.  FINDINGS: Imaging confirms access into the right kidney via posterior lower pole calyx. Final image demonstrates a 10 French nephrostomy in place.  COMPLICATIONS: None  IMPRESSION: Successful right percutaneous nephrostomy catheter placement.   Electronically Signed   By: Marybelle Killings M.D.   On: 03/20/2014 15:08      Microbiology: Recent Results (from the past 240 hour(s))  Urine culture     Status: None   Collection Time: 03/19/14  1:20 PM  Result Value Ref Range Status   Colony Count NO GROWTH  Final   Organism ID, Bacteria NO GROWTH  Final  Blood culture (routine x 2)     Status: None (Preliminary result)   Collection Time: 03/19/14 11:04 PM  Result Value Ref Range Status   Specimen Description BLOOD LEFT WRIST  Final   Special Requests   Final    BOTTLES DRAWN AEROBIC AND ANAEROBIC 10CC AEROBIC 4CC ANAEROBIC   Culture   Final           BLOOD CULTURE RECEIVED NO GROWTH TO DATE CULTURE WILL BE HELD FOR 5 DAYS BEFORE ISSUING A FINAL NEGATIVE REPORT Performed at [facility]    Report Status PENDING  Incomplete  Blood culture (routine x 2)     Status: None (Preliminary result)   Collection Time: 03/19/14 11:08 PM  Result Value Ref Range Status   Specimen Description BLOOD RIGHT HAND  Final   Special Requests BOTTLES DRAWN AEROBIC AND ANAEROBIC 5CC EACH  Final   Culture   Final           BLOOD CULTURE RECEIVED NO GROWTH TO DATE CULTURE WILL  BE HELD FOR 5 DAYS BEFORE ISSUING A FINAL NEGATIVE REPORT Performed at [facility]    Report Status PENDING  Incomplete  Urine culture     Status: None   Collection Time: 03/19/14 11:17 PM  Result Value Ref Range Status   Specimen Description URINE, CATHETERIZED  Final   Special Requests ADDED 0240 03/20/14  Final   Colony Count NO GROWTH Performed at [facility]   Final   Culture NO GROWTH Performed at [facility]   Final   Report Status 03/21/2014 FINAL  Final  Clostridium Difficile by PCR     Status: None   Collection Time: 03/20/14  9:12 AM  Result Value Ref Range Status   C difficile by pcr NEGATIVE NEGATIVE Final     Labs: Results for orders placed or performed during the hospital encounter of 03/19/14 (from the past 48 hour(s))  Comprehensive metabolic panel     Status: Abnormal   Collection Time: 03/22/14  3:45 AM  Result Value Ref Range   Sodium 133 (L) 135 - 145 mmol/L   Potassium 4.5 3.5 - 5.1 mmol/L   Chloride 103 96 - 112 mmol/L   CO2 20 19 - 32 mmol/L   Glucose, Bld 102 (H) 70 - 99 mg/dL   BUN 42 (H) 6 - 23 mg/dL   Creatinine, Ser 3.07 (H) 0.50 - 1.10 mg/dL   Calcium 7.9 (L) 8.4 - 10.5 mg/dL   Total Protein 5.6 (L) 6.0 - 8.3 g/dL   Albumin 2.3 (L) 3.5 - 5.2 g/dL   AST 121 (H) 0 - 37 U/L   ALT 49 (H) 0 - 35 U/L   Alkaline Phosphatase 241 (H) 39 - 117 U/L   Total Bilirubin 0.2 (L) 0.3 - 1.2 mg/dL   GFR calc non Af Amer 15 (L) >90 mL/min   GFR calc Af Amer 17 (L) >90 mL/min    Comment: (NOTE) The eGFR has been calculated using the CKD EPI equation. This calculation has not been validated in all clinical situations. eGFR's persistently <90 mL/min signify possible Chronic Kidney Disease.    Anion gap 10 5 - 15  CBC     Status: Abnormal   Collection Time: 03/22/14  3:45 AM  Result Value Ref Range   WBC 7.0 4.0 - 10.5 K/uL   RBC 2.83 (L) 3.87 - 5.11 MIL/uL   Hemoglobin 8.4 (L) 12.0 - 15.0 g/dL   HCT 24.9 (L) 36.0 - 46.0 %   MCV  88.0 78.0 - 100.0 fL   MCH 29.7 26.0 - 34.0 pg   MCHC 33.7 30.0 - 36.0 g/dL   RDW 15.3 11.5 - 15.5 %   Platelets 416 (H) 150 - 400 K/uL  CBC     Status: Abnormal   Collection Time: 03/23/14  5:37 AM  Result Value Ref Range   WBC 6.7 4.0 - 10.5 K/uL   RBC 2.80 (L) 3.87 - 5.11 MIL/uL   Hemoglobin 8.3 (L) 12.0 - 15.0 g/dL   HCT 24.8 (L) 36.0 - 46.0 %   MCV 88.6 78.0 - 100.0 fL   MCH 29.6 26.0 - 34.0 pg   MCHC 33.5 30.0 - 36.0 g/dL   RDW 15.3 11.5 - 15.5 %   Platelets 418 (H) 150 - 400 K/uL  Comprehensive metabolic panel     Status: Abnormal   Collection Time: 03/23/14  5:37 AM  Result Value Ref Range   Sodium 131 (L) 135 - 145 mmol/L   Potassium 4.1 3.5 - 5.1 mmol/L   Chloride 101 96 - 112 mmol/L   CO2 18 (L) 19 - 32 mmol/L   Glucose, Bld 86 70 - 99 mg/dL   BUN 32 (H) 6 - 23 mg/dL   Creatinine, Ser 2.19 (H) 0.50 - 1.10 mg/dL   Calcium 8.0 (L) 8.4 - 10.5 mg/dL   Total Protein  5.7 (L) 6.0 - 8.3 g/dL   Albumin 2.4 (L) 3.5 - 5.2 g/dL   AST 426 (H) 0 - 37 U/L   ALT 186 (H) 0 - 35 U/L   Alkaline Phosphatase 448 (H) 39 - 117 U/L   Total Bilirubin 0.6 0.3 - 1.2 mg/dL   GFR calc non Af Amer 23 (L) >90 mL/min   GFR calc Af Amer 26 (L) >90 mL/min    Comment: (NOTE) The eGFR has been calculated using the CKD EPI equation. This calculation has not been validated in all clinical situations. eGFR's persistently <90 mL/min signify possible Chronic Kidney Disease.    Anion gap 12 5 - 15     HPI :KEIRRA ZEIMET is a 64 y.o. female who presents to the ED after a hospital discharge follow up with her PCP today. Labs drawn today demonstrate that her creatinine has gone up from 1.8 at discharge to 5.7 today at PCPs office. This is confirmed on ED labs.  Patient was admitted on 2/18 with severe sepsis, AKI with creatinine of 3.0 at that time. This due to HCAP and E.Coli UTI. Although initially I didn't think she was going to survive without intubation due to her profound respiratory failure  (tachypnic to the upper 40s / 50s in the ED initially), she thankfully managed to pull through. Her AKI improved and creatinine was down to 1.8 at the time of discharge. Although initially on Zosyn/Vanc for broad spectrum coverage, she was discharged on cipro only.  She has not taken any NSAIDS in the past week, only tylenol. Her ACEI, and diuretics were held during the admission, and were not restarted at the time of discharge according to both family and Dr. Doristine Devoid discharge summary.  She has had a 6 day history of worsening, ascending, peripheral edema as well. She does report RLQ abdominal / flank pain which has been ongoing for the past 6 days (family was not aware of this). Patient does have a solitary R kidney secondary to previous L nephrectomy for kidney cancer  HOSPITAL COURSE: * Acute kidney failure- in setting of solitary kidney-improving -Secondary to obstructive uropathy Status post nephrostomy tube placement on 3/1  Urine output of 2300 mL in the last 24 hours Creatinine improving 6.07> 4.39> 3.07>2.19 F/U with her Urologist Dr. Edrick Oh in 2-3 weeks Will need 6-8 routine exchanges if PCN left in place,    HTN (hypertension) Continue Norvasc, PRN Labetalol   Periaortic lymphadenopathy vs mass Patient would need a biopsy Follow-up with Dr. Edrick Oh in [place], he will call patient for appointment Patient did go to SNF 3/4 and get an outpatient biopsy     Solitary right kidney   History of renal carcinoma - s/p left nephrectomy Now with right-sided hydronephrosis And 4.4 x 4.2 x 3.3 cm mass noted arising at the right side of the bladder, concerning for primary malignancy  Bipolar 1 disorder/ Continue Abilify, BuSpar, Trileptal  Code Status: full  Discharge Exam:    Blood pressure 118/61, pulse 89, temperature 98.7 F (37.1 C), temperature source Oral, resp. rate 18, height 5' 0.5" (1.537 m), weight 70.262 kg (154 lb 14.4 oz), last menstrual  period 01/20/2000, SpO2 99 %.  General: No acute respiratory distress Lungs: Clear to auscultation bilaterally without wheezes or crackles Cardiovascular: Regular rate and rhythm without murmur gallop or rub normal S1 and S2 Abdomen: Nontender, nondistended, soft, bowel sounds positive, no rebound, no ascites, no appreciable mass Extremities: No significant cyanosis, clubbing, or edema  bilateral lower extremities       Discharge Instructions    Diet - low sodium heart healthy    Complete by:  As directed      Diet - low sodium heart healthy    Complete by:  As directed      Increase activity slowly    Complete by:  As directed      Increase activity slowly    Complete by:  As directed              Signed: Steffany Schoenfelder 03/23/2014, 12:43 PM

## 2014-03-23 NOTE — Clinical Social Work Psychosocial (Addendum)
Clinical Social Work Department BRIEF PSYCHOSOCIAL ASSESSMENT 03/23/2014  Patient:  Martha Patton, Martha Patton     Account Number:  [id number]     Admit date:  03/19/2014  Clinical Social Worker:  Frederico Hamman  Date/Time:  03/23/2014 12:58 PM  Referred by:  Physician  Date Referred:  03/21/2014 Referred for  SNF Placement   Other Referral:   Interview type:  Patient Other interview type:   CSW also spoke with patient's husband    PSYCHOSOCIAL DATA Living Status:  HUSBAND Admitted from facility:   Level of care:   Primary support name:  Teddy Spike Primary support relationship to patient:  SPOUSE Degree of support available:   Strong support    CURRENT CONCERNS Current Concerns  Post-Acute Placement   Other Concerns:    SOCIAL WORK ASSESSMENT / PLAN On 03/21/14 CSW talked with patient and husband at the bedside regarding discharge planning and MD recommendation of [facility]. 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 During one of CSW's visits, one of their daughters was also in the room.    Patient agreeable to ST rehab and wants [facility] She also requested to be able to smoke a cigarette before getting to the facility and also be taken out to dinner once or twice while at facility. This was discussed and CSW shared a light-hearted moment with patient and her husband as she made her conditions known.    Family informed later that [facility] can accept patient for ST rehab and patient/husband were very pleased.   Assessment/plan status:  No Further Intervention Required Other assessment/ plan:   Information/referral to community resources:   Patient and husband provided with a skilled facility list for [facility].    PATIENT'S/FAMILY'S RESPONSE TO PLAN OF CARE: Patient agreeable  to short-term rehab and also made the conditions of her going to a facility known to her family and CSW.       Theodora Lalanne Givens, MSW, LCSW Licensed Clinical Social Worker [place] [phone]

## 2014-03-23 NOTE — Progress Notes (Signed)
Patient Discharge:  Disposition: Discharged to [facility] SNF  Education: Discharge summary sent to SNF and packet for SNF given to husband. AVS given to pt's husband. Explained. Verbalized understanding.   IV: Removed  Telemetry: N/A   Transportation: Transported home by husband  Belongings:All belongings taken with pt.

## 2014-03-23 NOTE — Clinical Social Work Placement (Addendum)
Clinical Social Work Department CLINICAL SOCIAL WORK PLACEMENT NOTE 03/23/2014  Patient:  MARKIAH, JANEWAY  Account Number:  [id number] Admit date:  03/19/2014  Clinical Social Worker:  Gale Klar Givens, LCSW  Date/time:  03/23/2014 01:11 AM  Clinical Social Work is seeking post-discharge placement for this patient at the following level of care:   SKILLED NURSING   (*CSW will update this form in Epic as items are completed)   03/21/2014  Patient/family provided with [place] Department of Clinical Social Work's list of facilities offering this level of care within the geographic area requested by the patient (or if unable, by the patient's family).  03/21/2014  Patient/family informed of their freedom to choose among providers that offer the needed level of care, that participate in Medicare, Medicaid or managed care program needed by the patient, have an available bed and are willing to accept the patient.    Patient/family informed of [facility]' ownership interest in [facility], as well as of the fact that they are under no obligation to receive care at this facility.  PASARR submitted to EDS in 2015  PASARR number received in 2015   FL2 transmitted to all facilities in geographic area requested by pt/family on  03/22/2014 FL2 transmitted to all facilities within larger geographic area on   Patient informed that his/her managed care company has contracts with or will negotiate with  certain facilities, including the following:     Patient/family informed of bed offers received:  03/22/2014 Patient chooses bed at [facility] Physician recommends and patient chooses bed at    Patient to be transferred to [facility] on  03/23/2014 Patient to be transferred to facility by Husband, Teddy Spike Patient and family notified of transfer on 03/22/2014 Name of family member notified:  Teddy Spike, spouse  The following physician request were entered in  Epic:   Additional Comments:   Orley Lawry Givens, MSW, [place] Licensed Clinical Social Worker Clinical Social Work [place] [phone]

## 2014-03-23 NOTE — Progress Notes (Signed)
   03/23/14 0010  BiPAP/CPAP/SIPAP  BiPAP/CPAP/SIPAP Pt Type Adult  Mask Type Nasal mask  Mask Size Medium  Set Rate 0 breaths/min  Respiratory Rate 16 breaths/min  IPAP (Max IPAP =20)  EPAP (Min EPAP =4)  Flow Rate 4 lpm  BiPAP/CPAP/SIPAP CPAP  Patient Home Equipment No  Auto Titrate Yes  Patient placed on CPAP Auto mode with 4l bleed in

## 2014-03-23 NOTE — Care Management Note (Signed)
CARE MANAGEMENT NOTE 03/23/2014  Patient:  Martha Patton, Martha Patton   Account Number:  [id number]  Date Initiated:  03/23/2014  Documentation initiated by:  Shakiera Edelson  Subjective/Objective Assessment:   CM following for progression and d/c planning.     Action/Plan:   Met with pt husband discussed plan for short term SNF, IM given.   Anticipated DC Date:  03/23/2014   Anticipated DC Plan:  SKILLED NURSING FACILITY         Choice offered to / List presented to:             Status of service:  Completed, signed off Medicare Important Message given?  YES (If response is "NO", the following Medicare IM given date fields will be blank) Date Medicare IM given:  03/23/2014 Medicare IM given by:  Kaci Dillie Date Additional Medicare IM given:   Additional Medicare IM given by:    Discharge Disposition:  [facility]  Per UR Regulation:    If discussed at Long Length of Stay Meetings, dates discussed:    Comments:

## 2014-03-26 ENCOUNTER — Telehealth: Payer: Self-pay

## 2014-03-26 ENCOUNTER — Non-Acute Institutional Stay (SKILLED_NURSING_FACILITY): Payer: Medicare Other | Admitting: Internal Medicine

## 2014-03-26 DIAGNOSIS — R599 Enlarged lymph nodes, unspecified: Secondary | ICD-10-CM

## 2014-03-26 DIAGNOSIS — N1330 Unspecified hydronephrosis: Secondary | ICD-10-CM | POA: Diagnosis not present

## 2014-03-26 DIAGNOSIS — R21 Rash and other nonspecific skin eruption: Secondary | ICD-10-CM

## 2014-03-26 DIAGNOSIS — Z85528 Personal history of other malignant neoplasm of kidney: Secondary | ICD-10-CM | POA: Diagnosis not present

## 2014-03-26 DIAGNOSIS — G4700 Insomnia, unspecified: Secondary | ICD-10-CM | POA: Diagnosis not present

## 2014-03-26 DIAGNOSIS — N179 Acute kidney failure, unspecified: Secondary | ICD-10-CM | POA: Diagnosis not present

## 2014-03-26 DIAGNOSIS — E039 Hypothyroidism, unspecified: Secondary | ICD-10-CM | POA: Diagnosis not present

## 2014-03-26 DIAGNOSIS — R590 Localized enlarged lymph nodes: Secondary | ICD-10-CM

## 2014-03-26 DIAGNOSIS — I10 Essential (primary) hypertension: Secondary | ICD-10-CM | POA: Diagnosis not present

## 2014-03-26 DIAGNOSIS — F3162 Bipolar disorder, current episode mixed, moderate: Secondary | ICD-10-CM | POA: Diagnosis not present

## 2014-03-26 DIAGNOSIS — J441 Chronic obstructive pulmonary disease with (acute) exacerbation: Secondary | ICD-10-CM

## 2014-03-26 DIAGNOSIS — R5381 Other malaise: Secondary | ICD-10-CM

## 2014-03-26 LAB — CULTURE, BLOOD (ROUTINE X 2)
CULTURE: NO GROWTH
Culture: NO GROWTH

## 2014-03-26 LAB — CBC AND DIFFERENTIAL
HCT: 25 % — AB (ref 36–46)
HEMOGLOBIN: 8.6 g/dL — AB (ref 12.0–16.0)
Platelets: 431 10*3/uL — AB (ref 150–399)
WBC: 6.1 10^3/mL

## 2014-03-26 NOTE — Progress Notes (Signed)
Patient ID: Martha Patton, female   DOB: October 25, 1950, 64 y.o.   MRN: [id number]     Facility: [facility]    PCP: Loura Pardon, MD  Code Status: DNR  Allergies  Allergen Reactions  . Spearmint Oil Hives  . Ezetimibe     REACTION: elevated lfts  . Fluvastatin Sodium     REACTION: elevated LFT's  . Naproxen     REACTION: GI  . Tape     Other reaction(s): UNKNOWN    Chief Complaint  Patient presents with  . New Admit To SNF     HPI:  64 year old patient is here for short term rehabilitation post hospital admission from 03/19/14-03/23/14 with right lower quadrant pain and worsening leg edema. She was noted to have obstructive uropathy with right sided hydronephrosis and had nephrostomy tube placed on 03/20/14. She was also diagnosed to have periaortic lymphadenopathy vs mass and is pending outpatient biopsy. She was in the hospital from 03/08/14-03/12/14 with severe sepsis from HCAP and e.coli UTI and AKI. She has history of left nephrectomy for renal cancer, HTN, bipolar disorder She is seen in her room today with her husband present. Her energy level is slowly picking up. Her appetite is poor. She is on o2 and breathing is at baseline. She had a bowel movement this am. Denies any concerns.  Review of Systems:  Constitutional: positive for easy fatigue. Negative for fever, chills, diaphoresis.  HENT: Negative for headache, congestion, nasal discharge. Eyes: Negative for eye pain, blurred vision, double vision and discharge.  Respiratory: Negative for wheezing.  has chronic copd on o2 and has dyspnea with exertion Cardiovascular: Negative for chest pain, palpitations. chronic leg swelling.  Gastrointestinal: Negative for heartburn, nausea, vomiting, abdominal pain. Genitourinary: Negative for dysuria Musculoskeletal: Negative for back pain, falls Skin: Negative for itching, rash.  Neurological: Negative for dizziness, tingling, focal  weakness Psychiatric/Behavioral: Negative for depression   Past Medical History  Diagnosis Date  . COPD (chronic obstructive pulmonary disease)   . Hypertension   . Thyroid disease   . Osteoarthritis   . Bipolar 1 disorder   . Cancer of kidney   . Urinary retention   . Deafness     cochlear implants bilat  . Hyperlipidemia   . Allergy   . Macular degeneration     left eye  . Osteopenia   . Obstructive sleep apnea on CPAP   . E. coli UTI (urinary tract infection)   . Sepsis   . Renal failure   . Complication of anesthesia     " difficulty because of copd during hysterectomy "   Past Surgical History  Procedure Laterality Date  . Appendectomy    . Cholecystectomy    . Abdominal hysterectomy    . Bladder suspension      bladder tack  . Nephrectomy      left  . Rotator cuff repair    . Tonsillectomy    . External ear surgery      x3  . Tibia fracture surgery      as a child  . Liver biopsy  02/1998    cysts  . Leep  2002  . Septoplasty  06/2003  . Cervical disc surgery     Social History:   reports that she has been smoking Cigarettes.  She has a 20 pack-year smoking history. She has never used smokeless tobacco. She reports that she drinks alcohol. She reports that she does not use  illicit drugs.  No family history on file.  Medications: Patient's Medications  New Prescriptions   No medications on file  Previous Medications   ACLIDINIUM BROMIDE (TUDORZA PRESSAIR) 400 MCG/ACT AEPB    Inhale 400 mcg into the lungs every 12 (twelve) hours.   ALBUTEROL (PROVENTIL,VENTOLIN) 90 MCG/ACT INHALER    Inhale 2 puffs into the lungs every 6 (six) hours as needed. For shortness of breath   AMLODIPINE (NORVASC) 10 MG TABLET    Take 1 tablet (10 mg total) by mouth daily.   ARIPIPRAZOLE (ABILIFY) 20 MG TABLET    Take 20 mg by mouth at bedtime.   ATORVASTATIN (LIPITOR) 10 MG TABLET    TAKE 1 TABLET (10 MG TOTAL) BY MOUTH DAILY.   BUPROPION (WELLBUTRIN XL) 150 MG 24 HR TABLET     Take 1 tablet (150 mg total) by mouth daily.   BUSPIRONE (BUSPAR) 7.5 MG TABLET    Take 1 tablet (7.5 mg total) by mouth 2 (two) times daily.   CLONAZEPAM (KLONOPIN) 0.5 MG TABLET    Take 1 tablet (0.5 mg total) by mouth 2 (two) times daily as needed. For nerves   FLUTICASONE (FLONASE) 50 MCG/ACT NASAL SPRAY    1 spray by Nasal route 2 (two) times daily.     FLUTICASONE-SALMETEROL (ADVAIR DISKUS) 250-50 MCG/DOSE AEPB    Inhale 2 puffs into the lungs 2 (two) times daily.    LEVOTHYROXINE (SYNTHROID, LEVOTHROID) 100 MCG TABLET    TAKE 1 TABLET (100 MCG TOTAL) BY MOUTH DAILY.   MULTIPLE VITAMINS-MINERALS (CENTRUM SILVER PO)    Take 1 tablet by mouth daily.    OXCARBAZEPINE (TRILEPTAL) 600 MG TABLET    Take 600 mg by mouth 2 (two) times daily.    OXYCODONE (OXY-IR) 5 MG CAPSULE    Take 1 capsule (5 mg total) by mouth every 6 (six) hours as needed.   TAMSULOSIN HCL (FLOMAX) 0.4 MG CAPS    Take 0.4 mg by mouth daily.    TRAZODONE (DESYREL) 100 MG TABLET    Take 150 mg by mouth at bedtime.   Modified Medications   No medications on file  Discontinued Medications   No medications on file     Physical Exam: Filed Vitals:   03/26/14 1420  BP: 130/68  Pulse: 69  Temp: 97.5 F (36.4 C)  Resp: 18  SpO2: 95%    General- elderly female, well built, in no acute distress, chronically ill appearing Head- normocephalic, atraumatic, hard of hearing Throat- moist mucus membrane Neck- no cervical lymphadenopathy Cardiovascular- normal s1,s2, no murmurs, palpable dorsalis pedis, 2+ leg edema Respiratory- bilateral poor air entry, on o2 by nasal canula, no wheeze, no rhonchi, no crackles, no use of accessory muscles Abdomen- bowel sounds present, soft, non tender, mild distension, right nephrostomy tube draining well, mild erythema at insertion site, non tender, no drainage around the site Musculoskeletal- able to move all 4 extremities, generalized weakness Neurological- no focal deficit Skin- warm  and dry, mild pallor Psychiatry- alert and oriented with normal mood and affect    Labs reviewed: Basic Metabolic Panel:  Recent Labs  03/19/14 1320  03/21/14 1000 03/22/14 0345 03/23/14 0537  NA 131*  < > 133* 133* 131*  K 5.0  < > 4.4 4.5 4.1  CL 101  < > 102 103 101  CO2 22  < > 23 20 18*  GLUCOSE 80  < > 152* 102* 86  BUN 56*  < > 48* 42* 32*  CREATININE  5.70*  < > 4.39* 3.07* 2.19*  CALCIUM 8.1*  < > 7.9* 7.9* 8.0*  PHOS 6.2*  --   --   --   --   < > = values in this interval not displayed. Liver Function Tests:  Recent Labs  03/19/14 2304 03/22/14 0345 03/23/14 0537  AST 27 121* 426*  ALT 15 49* 186*  ALKPHOS 76 241* 448*  BILITOT 0.4 0.2* 0.6  PROT 6.2 5.6* 5.7*  ALBUMIN 2.7* 2.3* 2.4*    Recent Labs  03/19/14 2304  LIPASE 70*   No results for input(s): AMMONIA in the last 8760 hours. CBC:  Recent Labs  03/08/14 0301  03/19/14 1320 03/19/14 2304 03/20/14 0506 03/22/14 0345 03/23/14 0537  WBC 11.7*  < > 10.4 12.0* 11.6* 7.0 6.7  NEUTROABS 10.5*  --  9.3* 10.7*  --   --   --   HGB 9.9*  < > 8.8 Repeated and verified X2.* 9.0* 9.0* 8.4* 8.3*  HCT 28.9*  < > 26.4 Repeated and verified X2.* 26.6* 26.3* 24.9* 24.8*  MCV 87.0  < > 88.3 87.5 88.6 88.0 88.6  PLT 359  < > 426.0* 421* 391 416* 418*  < > = values in this interval not displayed. Cardiac Enzymes: No results for input(s): CKTOTAL, CKMB, CKMBINDEX, TROPONINI in the last 8760 hours. BNP: Invalid input(s): POCBNP CBG:  Recent Labs  02/12/14 2144 02/13/14 0633  GLUCAP 103* 96   03/26/14 wbc 6.1, hb 8.6, hct 25.1, plt 431, na 132, k 4.7, cr 1.16, ca 8, tsh 8.260  Radiological Exams: Ct Abdomen Pelvis Wo Contrast  03/20/2014   CLINICAL DATA:  Acute onset of suprapubic abdominal pain. Significantly worsening creatinine. Initial encounter.  EXAM: CT ABDOMEN AND PELVIS WITHOUT CONTRAST  TECHNIQUE: Multidetector CT imaging of the abdomen and pelvis was performed following the standard protocol  without IV contrast.  COMPARISON:  None.  FINDINGS: Small bilateral pleural effusions are noted. Trace pericardial fluid is also seen, within normal limits.  Small to moderate volume ascites is seen along the abdomen and pelvis, demonstrating mild bulging along the left lateral abdominal wall.  There is a 10.6 cm cyst within the right hepatic lobe. The patient is status post cholecystectomy. The liver and spleen are grossly unremarkable in appearance, though the spleen is surrounded by fluid. The pancreas and adrenal glands are unremarkable.  Relatively severe chronic right-sided hydronephrosis is noted. This appears to be secondary to a right-sided bladder mass. The left kidney has been resected, reflecting the patient's history of renal cell carcinoma. Right-sided perinephric stranding is noted. No renal or ureteral stones are seen.  The small bowel is unremarkable in appearance. The stomach is within normal limits.  The abdominal aorta is difficult to assess without contrast. Relatively diffuse calcification is seen along the abdominal aorta and its branches. There is diffuse soft tissue density surrounding the abdominal aorta along most of its course, concerning for matted lymphadenopathy or surrounding mass. Significantly enlarged mesenteric nodes are seen, suspicious for a metastatic process. This could conceivably reflect either the bladder mass or the patient's known history of renal cell carcinoma.  The patient is status post appendectomy. Scattered diverticulosis is noted along the descending and sigmoid colon, without evidence of diverticulitis. The colon is otherwise unremarkable.  The bladder is decompressed. There is an asymmetric 4.4 x 4.2 x 3.3 cm mass at the right side of the bladder, concerning for malignancy. This likely explains the patient's right-sided hydronephrosis. The patient is status post  hysterectomy. No suspicious adnexal masses are seen. No inguinal lymphadenopathy is seen.  Two small  anterior abdominal hernias are noted just to the right of midline, containing only fat and trace fluid.  Diffuse soft tissue edema is noted along the abdominal wall, compatible with mild anasarca.  No acute osseous abnormalities are identified. 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  IMPRESSION: 1. Relatively severe chronic right-sided hydronephrosis noted. This appears to be secondary to a right-sided bladder mass, as described below. Given prior left sided nephrectomy, this explains the patient's renal failure. 2. 4.4 x 4.2 x 3.3 cm mass noted arising at the right side of the bladder, concerning for primary malignancy. 3. Diffuse soft tissue density surrounding the abdominal aorta along most of its course, concerning for matted lymphadenopathy or surrounding mass. Significantly enlarged mesenteric nodes seen, suspicious for a metastatic process. 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 4. Small bilateral pleural effusions noted. 5. Small to moderate volume ascites within the abdomen and pelvis. 6. Large 10.6 cm cyst within the right hepatic lobe. 7. Two small anterior abdominal wall hernias noted just to the right of midline, containing only fat and trace fluid. 8. Relatively diffuse calcification along the abdominal aorta and its branches; the abdominal aorta is not well assessed without contrast. 9. Diffuse soft tissue edema along the abdominal wall, compatible with mild anasarca. 10. Scattered diverticulosis along the descending and sigmoid colon, without evidence of diverticulitis. 11. 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   Electronically Signed   By: Garald Balding M.D.   On: 03/20/2014 03:22   Dg Chest Port 1 View  03/08/2014   CLINICAL DATA:  Dyspnea  EXAM: PORTABLE CHEST - 1 VIEW  COMPARISON:  02/10/2014  FINDINGS: There is left base consolidation, new  from 02/10/2014. This could represent pneumonia. The right lung is clear. No large effusions are evident. Pulmonary vasculature is normal. Heart size is unchanged.  IMPRESSION: New left base consolidation. This could represent pneumonia. Follow-up radiography recommended to confirm complete clearing after treatment.   Electronically Signed   By: Andreas Newport M.D.   On: 03/08/2014 03:38   Ir Nephrostomy Placement Right  03/20/2014   CLINICAL DATA:  Right ureteral obstruction.  Bladder mass.  EXAM: IR NEPHROSTOGRAM INI PLACEMENT RIGHT  FLUOROSCOPY TIME:  2 minutes and 30 seconds.  MEDICATIONS AND MEDICAL HISTORY: Versed 1 mg, Fentanyl 75 mcg.  Additional Medications: Ancef.  ANESTHESIA/SEDATION: Moderate sedation time: 30 minutes  CONTRAST:  10 cc Omnipaque 300  PROCEDURE: The procedure, risks, benefits, and alternatives were explained to the patient. Questions regarding the procedure were encouraged and answered. The patient understands and consents to the procedure.  The back was prepped with Betadine in a sterile fashion, and a sterile drape was applied covering the operative field. A sterile gown and sterile gloves were used for the procedure.  Under sonographic guidance, a 21 gauge needle was inserted into a posterior lower pole calyx and removed over a 018 wire. This was up sized to a 3 J. A 10 French nephrostomy was advanced over the wire and looped in the renal pelvis. Contrast was injected.  FINDINGS: Imaging confirms access into the right kidney via posterior lower pole calyx. Final image demonstrates a 10 French nephrostomy in place. COMPLICATIONS: None  IMPRESSION: Successful right percutaneous nephrostomy catheter placement.   Electronically Signed   By: Rodena Goldmann.D.  On: 03/20/2014 15:08     Assessment/Plan  Physical deconditioning Will have her work with physical therapy and occupational therapy team to help with gait training and muscle strengthening exercises.fall precautions. Skin  care. Encourage to be out of bed.   Acute kidney failure in setting of secondary to obstructive uropathy. Status post nephrostomy tube placement on 3/1 which is draining out good. Has follow up with urology Edrick Oh on 03/29/14. Renal lab work shows improvement.  Right hydronephrosis Status post nephrostomy tube placement on 3/1. Also on flomax. Has f/u with urology  Abdominal fold rash Continue nystatin powder bid prn for now and monitor  Copd On chronic 2, continue her bronchodilators, uses cpap at night  Leg edema Chronic, off diuretics with impaired kidney function. Keep legs elevated at rest and have ted hose  HTN  Stable. Continue Norvasc 10 mg daily. monitor  Periaortic lymphadenopathy vs mass F/u Dr. Edrick Oh in [place], possible outpatient biopsy  Hypothyroidism Reviewed tsh- elevated. With her recent hospital admission and acute episode, concern of sick euthyroid syndrome. Continue levothyroxine 100 mcg daily and recheck tsh in 4 weeks  Bipolar 1 disorder Continue Abilify 20 mg daily, wellbutrin xl 150 mg daily and buspar 7.5 mg bid with clonazepam 0.5 mg bid prn for anxiety. Also on trileptal 600 mg bid  Hyperlipidemia Continue her lipitor  Insomnia On trazodone 100 mg qhs  History of renal carcinoma s/p left nephrectomy   Goals of care: short term rehabilitation   Labs/tests ordered: cmp 03/29/14 Family/ staff Communication: reviewed care plan with patient and nursing supervisor    Blanchie Serve, MD  [place] [phone] (Monday-Friday 8 am - 5 pm) [phone] (afterhours)

## 2014-03-26 NOTE — Telephone Encounter (Signed)
Patient is at [facility] place for now.  She will see Dr Lorelei Pont this week.

## 2014-03-26 NOTE — Telephone Encounter (Signed)
Left message for patient to call back  

## 2014-03-26 NOTE — Telephone Encounter (Signed)
Mr Slaven left v/m returning call and request cb at [phone].

## 2014-03-30 DIAGNOSIS — J9621 Acute and chronic respiratory failure with hypoxia: Secondary | ICD-10-CM

## 2014-03-30 DIAGNOSIS — J189 Pneumonia, unspecified organism: Secondary | ICD-10-CM | POA: Diagnosis not present

## 2014-03-30 DIAGNOSIS — N390 Urinary tract infection, site not specified: Secondary | ICD-10-CM | POA: Diagnosis not present

## 2014-03-30 DIAGNOSIS — J449 Chronic obstructive pulmonary disease, unspecified: Secondary | ICD-10-CM | POA: Diagnosis not present

## 2014-04-03 ENCOUNTER — Other Ambulatory Visit: Payer: Self-pay | Admitting: *Deleted

## 2014-04-03 MED ORDER — HYDROCODONE-ACETAMINOPHEN 5-325 MG PO TABS: ORAL_TABLET | ORAL | Status: DC

## 2014-04-03 NOTE — Telephone Encounter (Signed)
[facility] 

## 2014-04-05 ENCOUNTER — Non-Acute Institutional Stay (SKILLED_NURSING_FACILITY): Payer: Medicare Other | Admitting: Registered Nurse

## 2014-04-05 DIAGNOSIS — J449 Chronic obstructive pulmonary disease, unspecified: Secondary | ICD-10-CM | POA: Diagnosis not present

## 2014-04-05 DIAGNOSIS — R600 Localized edema: Secondary | ICD-10-CM

## 2014-04-05 DIAGNOSIS — R7989 Other specified abnormal findings of blood chemistry: Secondary | ICD-10-CM | POA: Diagnosis not present

## 2014-04-05 DIAGNOSIS — E871 Hypo-osmolality and hyponatremia: Secondary | ICD-10-CM

## 2014-04-05 DIAGNOSIS — R945 Abnormal results of liver function studies: Secondary | ICD-10-CM

## 2014-04-05 LAB — HEPATIC FUNCTION PANEL
ALT: 85 U/L — AB (ref 7–35)
AST: 100 U/L — AB (ref 13–35)
Alkaline Phosphatase: 987 U/L — AB (ref 25–125)

## 2014-04-05 LAB — BASIC METABOLIC PANEL
BUN: 13 mg/dL (ref 4–21)
Creatinine: 1.2 mg/dL — AB (ref 0.5–1.1)
GLUCOSE: 104 mg/dL
POTASSIUM: 4.6 mmol/L (ref 3.4–5.3)
Sodium: 129 mmol/L — AB (ref 137–147)

## 2014-04-05 NOTE — Progress Notes (Signed)
Patient ID: Martha Patton, female   DOB: 27-May-1950, 64 y.o.   MRN: [id number]   Place of Service: [facility]  Allergies  Allergen Reactions  . Spearmint Oil Hives  . Ezetimibe     REACTION: elevated lfts  . Fluvastatin Sodium     REACTION: elevated LFT's  . Naproxen     REACTION: GI  . Tape     Other reaction(s): UNKNOWN    Code Status: DNR  Goals of Care: Comfort and Quality of Life/STR  Chief Complaint  Patient presents with  . Acute Visit    worsening of edema    HPI  64 y.o. female with PMH of severe COPD, oxygen dependent, hypothryoidism, HTN, renal cancer s/p left nephrectomy, bladder mass among others is being seen for an acute visit at the request of family for the evaluation of worsening of BLE edema. Seen in wheelchair today. Spouse at bedside. Reported having shortness of breath but no more than usual and worsening of bil leg edema. Unable to tolerate TED hose. Stated that she was taking lasix at home to help with the swelling but it was discontinue in the hospital due to "kidney" problem. Denies any other concerns.   Review of Systems Constitutional: Negative for fever and chills Cardiovascular: Negative for chest pain, palpitations. Positive for leg swelling Respiratory: Negative cough and wheezing. Positive for shortness of breath Gastrointestinal: Negative for nausea and vomiting. Negative for abdominal pain Neurological: Negative for dizziness and headache  Past Medical History  Diagnosis Date  . COPD (chronic obstructive pulmonary disease)   . Hypertension   . Thyroid disease   . Osteoarthritis   . Bipolar 1 disorder   . Cancer of kidney   . Urinary retention   . Deafness     cochlear implants bilat  . Hyperlipidemia   . Allergy   . Macular degeneration     left eye  . Osteopenia   . Obstructive sleep apnea on CPAP   . E. coli UTI (urinary tract infection)   . Sepsis   . Renal failure   . Complication of anesthesia     "  difficulty because of copd during hysterectomy "    Past Surgical History  Procedure Laterality Date  . Appendectomy    . Cholecystectomy    . Abdominal hysterectomy    . Bladder suspension      bladder tack  . Nephrectomy      left  . Rotator cuff repair    . Tonsillectomy    . External ear surgery      x3  . Tibia fracture surgery      as a child  . Liver biopsy  02/1998    cysts  . Leep  2002  . Septoplasty  06/2003  . Cervical disc surgery      History   Social History  . Marital Status: Married    Spouse Name: N/A  . Number of Children: 2  . Years of Education: N/A   Occupational History  . on disability for psych    Social History Main Topics  . Smoking status: Current Some Day Smoker -- 0.50 packs/day for 40 years    Types: Cigarettes  . Smokeless tobacco: Never Used     Comment: has quit in the past on and off   . Alcohol Use: 0.0 oz/week    0 Standard drinks or equivalent per week     Comment: rare  . Drug Use: No  .  Sexual Activity: Not on file   Other Topics Concern  . Not on file   Social History Narrative      Medication List       This list is accurate as of: 04/05/14  5:32 PM.  Always use your most recent med list.               ADVAIR DISKUS 250-50 MCG/DOSE Aepb  Generic drug:  Fluticasone-Salmeterol  Inhale 2 puffs into the lungs 2 (two) times daily.     albuterol 90 MCG/ACT inhaler  Commonly known as:  PROVENTIL,VENTOLIN  Inhale 2 puffs into the lungs every 6 (six) hours as needed. For shortness of breath     amLODipine 10 MG tablet  Commonly known as:  NORVASC  Take 1 tablet (10 mg total) by mouth daily.     ARIPiprazole 20 MG tablet  Commonly known as:  ABILIFY  Take 20 mg by mouth at bedtime.     atorvastatin 10 MG tablet  Commonly known as:  LIPITOR  TAKE 1 TABLET (10 MG TOTAL) BY MOUTH DAILY.     buPROPion 150 MG 24 hr tablet  Commonly known as:  WELLBUTRIN XL  Take 1 tablet (150 mg total) by mouth daily.      busPIRone 7.5 MG tablet  Commonly known as:  BUSPAR  Take 1 tablet (7.5 mg total) by mouth 2 (two) times daily.     CENTRUM SILVER PO  Take 1 tablet by mouth daily.     clonazePAM 0.5 MG tablet  Commonly known as:  KLONOPIN  Take 1 tablet (0.5 mg total) by mouth 2 (two) times daily as needed. For nerves     FLOMAX 0.4 MG Caps capsule  Generic drug:  tamsulosin  Take 0.4 mg by mouth daily.     fluticasone 50 MCG/ACT nasal spray  Commonly known as:  FLONASE  1 spray by Nasal route 2 (two) times daily.     levothyroxine 100 MCG tablet  Commonly known as:  SYNTHROID, LEVOTHROID  TAKE 1 TABLET (100 MCG TOTAL) BY MOUTH DAILY.     oxycodone 5 MG capsule  Commonly known as:  OXY-IR  Take 1 capsule (5 mg total) by mouth every 6 (six) hours as needed.     traZODone 100 MG tablet  Commonly known as:  DESYREL  Take 150 mg by mouth at bedtime.     TRILEPTAL 600 MG tablet  Generic drug:  oxcarbazepine  Take 600 mg by mouth 2 (two) times daily.     TUDORZA PRESSAIR 400 MCG/ACT Aepb  Generic drug:  Aclidinium Bromide  Inhale 400 mcg into the lungs every 12 (twelve) hours.        Physical Exam  BP 142/90 mmHg  Pulse 100  Temp(Src) 98.6 F (37 C)  Resp 20  SpO2 95%  LMP 01/20/2000  Constitutional: frail adult  female in no acute distress. Appears older than stated age.  HEENT: Normocephalic and atraumatic. PERRL. EOM intact. No icterus.  Neck:No lymphadenopathy, masses, or thyromegaly. No JVD or carotid bruits. Cardiac: Normal S1, S2. RRR without appreciable murmurs, rubs, or gallops. Distal pulses intact. 2+ pitting edema of BLE Lungs: No respiratory distress. Breath sounds diminished bilaterally with wheezes throughout lung fields. Oxygen via [place] in place Abdomen: Audible bowel sounds in all quadrants. Soft, nontender, nondistended.   Musculoskeletal: able to move all extremities.  Skin: Warm and dry. Neurological: Alert and oriented to self Psychiatric: Appropriate mood  and affect.   Labs  Reviewed  CBC Latest Ref Rng 03/23/2014 03/22/2014 03/20/2014  WBC 4.0 - 10.5 K/uL 6.7 7.0 11.6(H)  Hemoglobin 12.0 - 15.0 g/dL 8.3(L) 8.4(L) 9.0(L)  Hematocrit 36.0 - 46.0 % 24.8(L) 24.9(L) 26.3(L)  Platelets 150 - 400 K/uL 418(H) 416(H) 391     CMP Latest Ref Rng 04/05/2014 03/23/2014 03/22/2014  Glucose 70 - 99 mg/dL - 86 102(H)  BUN 4 - 21 mg/dL 13 32(H) 42(H)  Creatinine 0.5 - 1.1 mg/dL 1.2(A) 2.19(H) 3.07(H)  Sodium 137 - 147 mmol/L 129(A) 131(L) 133(L)  Potassium 3.4 - 5.3 mmol/L 4.6 4.1 4.5  Chloride 96 - 112 mmol/L - 101 103  CO2 19 - 32 mmol/L - 18(L) 20  Calcium 8.4 - 10.5 mg/dL - 8.0(L) 7.9(L)  Total Protein 6.0 - 8.3 g/dL - 5.7(L) 5.6(L)  Total Bilirubin 0.3 - 1.2 mg/dL - 0.6 0.2(L)  Alkaline Phos 25 - 125 U/L 987(A) 448(H) 241(H)  AST 13 - 35 U/L 100(A) 426(H) 121(H)  ALT 7 - 35 U/L 85(A) 186(H) 49(H)    Assessment & Plan 1. Bilateral leg edema Unable to tolerate TED hose. No a good candidate for lasix right now due to hyponatremia. Will initiate 1200cc fluid restriction. Encourage elevating BLE as much as possible and continue monitoring for now.   2. COPD, severe Continue turdorza 472mcg every 12 hours. Start duoneb every six hours x 3 days then every six hours as needed for shortness of breath and wheezing. Continue oxygen therapy to maintain sat >88%. Continue to monitor her status.   3. Abnormal LFTs Discontinue hydrocodone/acetaminphen. Avoid hepatotoxic agents. Continue to monitor lfts.   4. Hyponatremia Na 129. 1200cc/day fluid restriction initiated. Repeat cmp on 04/09/14 as spouse stated that she will be leaving on 04/11/14. Continue to monitor her status.   Diagnostic Studies/Labs Ordered: cmp   Family/Staff Communication Plan of care discussed with patient, spouse, and nursing staff. Patient, spouse, and nursing staff verbalized understanding and agree with plan of care. No additional questions or concerns reported.    Arthur Holms, MSN,  AGNP-C [facility] [address] [place], [place] [address] [phone] [8am-5pm] After hours: [phone]

## 2014-04-06 ENCOUNTER — Other Ambulatory Visit: Payer: Self-pay | Admitting: *Deleted

## 2014-04-06 MED ORDER — OXYCODONE HCL 5 MG PO CAPS: ORAL_CAPSULE | ORAL | Status: AC

## 2014-04-06 NOTE — Telephone Encounter (Signed)
[facility] 

## 2014-04-09 ENCOUNTER — Ambulatory Visit
Admit: 2014-04-09 | Disposition: A | Discharge status: Home / Self Care | Payer: Self-pay | Attending: Oncology | Admitting: Oncology

## 2014-04-09 ENCOUNTER — Non-Acute Institutional Stay (SKILLED_NURSING_FACILITY): Payer: Medicare Other | Admitting: Registered Nurse

## 2014-04-09 DIAGNOSIS — N1330 Unspecified hydronephrosis: Secondary | ICD-10-CM | POA: Diagnosis not present

## 2014-04-09 DIAGNOSIS — E871 Hypo-osmolality and hyponatremia: Secondary | ICD-10-CM | POA: Diagnosis not present

## 2014-04-09 DIAGNOSIS — R5381 Other malaise: Secondary | ICD-10-CM

## 2014-04-09 DIAGNOSIS — N3289 Other specified disorders of bladder: Secondary | ICD-10-CM

## 2014-04-09 DIAGNOSIS — E039 Hypothyroidism, unspecified: Secondary | ICD-10-CM

## 2014-04-09 DIAGNOSIS — J441 Chronic obstructive pulmonary disease with (acute) exacerbation: Secondary | ICD-10-CM | POA: Diagnosis not present

## 2014-04-09 DIAGNOSIS — I10 Essential (primary) hypertension: Secondary | ICD-10-CM

## 2014-04-09 DIAGNOSIS — G4700 Insomnia, unspecified: Secondary | ICD-10-CM

## 2014-04-09 DIAGNOSIS — R7989 Other specified abnormal findings of blood chemistry: Secondary | ICD-10-CM

## 2014-04-09 DIAGNOSIS — R600 Localized edema: Secondary | ICD-10-CM | POA: Diagnosis not present

## 2014-04-09 DIAGNOSIS — N179 Acute kidney failure, unspecified: Secondary | ICD-10-CM | POA: Diagnosis not present

## 2014-04-09 DIAGNOSIS — F3162 Bipolar disorder, current episode mixed, moderate: Secondary | ICD-10-CM

## 2014-04-09 DIAGNOSIS — R945 Abnormal results of liver function studies: Secondary | ICD-10-CM

## 2014-04-09 LAB — BASIC METABOLIC PANEL
BUN: 17 mg/dL (ref 4–21)
Creatinine: 1.1 mg/dL (ref 0.5–1.1)
Glucose: 80 mg/dL
Potassium: 4.7 mmol/L (ref 3.4–5.3)
Sodium: 131 mmol/L — AB (ref 137–147)

## 2014-04-09 LAB — HEPATIC FUNCTION PANEL
ALK PHOS: 999 U/L — AB (ref 25–125)
ALT: 84 U/L — AB (ref 7–35)
AST: 100 U/L — AB (ref 13–35)

## 2014-04-09 NOTE — Progress Notes (Signed)
Patient ID: Martha Patton, female   DOB: 11/09/1950, 64 y.o.   MRN: [id number]   Place of Service: [facility]  Allergies  Allergen Reactions  . Spearmint Oil Hives  . Ezetimibe     REACTION: elevated lfts  . Fluvastatin Sodium     REACTION: elevated LFT's  . Naproxen     REACTION: GI  . Tape     Other reaction(s): UNKNOWN    Code Status: DNR  Goals of Care: Comfort and Quality of Life/STR  Chief Complaint  Patient presents with  . Discharge Note    HPI 64 y.o. female with PMH of severe COPD on chronic oxygen, bipolar disease, renal cell cancer, HTN, hypothyroidism, OSA on CPAP at night among others is being seen for a discharge visit. Patient was here for short-term rehabilitation post hospital admission from 03/19/14 to 03/23/14 with right lower quadrant pain and worsening leg edema. She was noted to have obstructive uropathy with right sided hydronephrosis s/p nephrostomy tube on 03/20/14. She was also diagnosed to have periaortic lymphadenopathy vs mass. Per spouse, had "bladder mass" removed by urology last week and is now able to void.  Patient has worked with therapy team and is ready to be discharged home with [facility] PT/OT and nebulizer.   Review of Systems Constitutional: Negative for fever and chills HENT: Negative for ear pain and sore throat. Positive for hearing impairment Eyes: Negative for eye pain and eye discharge Cardiovascular: Negative for chest pain. Positive for leg swelling Respiratory: Negative cough. Positive for shortness of breath Gastrointestinal: Negative for nausea and vomiting. Negative for abdominal pain Genitourinary: Negative for dysuria Musculoskeletal: Positive for generalized pain Neurological: Negative for dizziness and headache. Positive for weakness Skin: Negative for rash and itchiness.    Psychiatric: Positive for depression. Negative for suicidal ideation   Past Medical History  Diagnosis Date  . COPD (chronic obstructive  pulmonary disease)   . Hypertension   . Thyroid disease   . Osteoarthritis   . Bipolar 1 disorder   . Cancer of kidney   . Urinary retention   . Deafness     cochlear implants bilat  . Hyperlipidemia   . Allergy   . Macular degeneration     left eye  . Osteopenia   . Obstructive sleep apnea on CPAP   . E. coli UTI (urinary tract infection)   . Sepsis   . Renal failure   . Complication of anesthesia     " difficulty because of copd during hysterectomy "    Past Surgical History  Procedure Laterality Date  . Appendectomy    . Cholecystectomy    . Abdominal hysterectomy    . Bladder suspension      bladder tack  . Nephrectomy      left  . Rotator cuff repair    . Tonsillectomy    . External ear surgery      x3  . Tibia fracture surgery      as a child  . Liver biopsy  02/1998    cysts  . Leep  2002  . Septoplasty  06/2003  . Cervical disc surgery      History   Social History  . Marital Status: Married    Spouse Name: N/A  . Number of Children: 2  . Years of Education: N/A   Occupational History  . on disability for psych    Social History Main Topics  . Smoking status: Current Some Day Smoker --  0.50 packs/day for 40 years    Types: Cigarettes  . Smokeless tobacco: Never Used     Comment: has quit in the past on and off   . Alcohol Use: 0.0 oz/week    0 Standard drinks or equivalent per week     Comment: rare  . Drug Use: No  . Sexual Activity: Not on file   Other Topics Concern  . Not on file   Social History Narrative      Medication List       This list is accurate as of: 04/09/14 11:59 PM.  Always use your most recent med list.               ADVAIR DISKUS 250-50 MCG/DOSE Aepb  Generic drug:  Fluticasone-Salmeterol  Inhale 2 puffs into the lungs 2 (two) times daily.     albuterol 108 (90 BASE) MCG/ACT inhaler  Commonly known as:  PROVENTIL HFA;VENTOLIN HFA  Inhale 2 puffs into the lungs every 6 (six) hours as needed for wheezing  or shortness of breath.     amLODipine 10 MG tablet  Commonly known as:  NORVASC  Take 1 tablet (10 mg total) by mouth daily.     ARIPiprazole 20 MG tablet  Commonly known as:  ABILIFY  Take 20 mg by mouth at bedtime.     atorvastatin 10 MG tablet  Commonly known as:  LIPITOR  TAKE 1 TABLET (10 MG TOTAL) BY MOUTH DAILY.     buPROPion 150 MG 24 hr tablet  Commonly known as:  WELLBUTRIN XL  Take 1 tablet (150 mg total) by mouth daily.     busPIRone 7.5 MG tablet  Commonly known as:  BUSPAR  Take 1 tablet (7.5 mg total) by mouth 2 (two) times daily.     CENTRUM SILVER PO  Take 1 tablet by mouth daily.     clonazePAM 0.5 MG tablet  Commonly known as:  KLONOPIN  Take 1 tablet (0.5 mg total) by mouth 2 (two) times daily as needed. For nerves     FLOMAX 0.4 MG Caps capsule  Generic drug:  tamsulosin  Take 0.4 mg by mouth daily.     fluticasone 50 MCG/ACT nasal spray  Commonly known as:  FLONASE  1 spray by Nasal route 2 (two) times daily.     ipratropium-albuterol 0.5-2.5 (3) MG/3ML Soln  Commonly known as:  DUONEB  Take 3 mLs by nebulization every 4 (four) hours as needed.     levothyroxine 100 MCG tablet  Commonly known as:  SYNTHROID, LEVOTHROID  TAKE 1 TABLET (100 MCG TOTAL) BY MOUTH DAILY.     oxycodone 5 MG capsule  Commonly known as:  OXY-IR  Take one tablet by mouth every 6 hours as needed for pain     traZODone 100 MG tablet  Commonly known as:  DESYREL  Take 150 mg by mouth at bedtime.     TRILEPTAL 600 MG tablet  Generic drug:  oxcarbazepine  Take 600 mg by mouth 2 (two) times daily.     TUDORZA PRESSAIR 400 MCG/ACT Aepb  Generic drug:  Aclidinium Bromide  Inhale 400 mcg into the lungs every 12 (twelve) hours.        Physical Exam  BP 131/81 mmHg  Pulse 94  Temp(Src) 97.7 F (36.5 C)  Resp 20  Ht 5' (1.524 m)  Wt 168 lb 12.8 oz (76.567 kg)  BMI 32.97 kg/m2  SpO2 96%  LMP 01/20/2000  Constitutional: Frail adult female  in no acute  distress. Appears older than stated age. HEENT: Normocephalic and atraumatic. PERRL. EOM intact. No scleral icterus. No nasal discharge or sinus tenderness. Oral mucosa moist. Posterior pharynx clear of any exudate or lesions.  Neck: No lymphadenopathy, masses, or thyromegaly. No JVD or carotid bruits. Cardiac: Normal S1, S2. RRR without appreciable murmurs, rubs, or gallops. Distal pulses not palpable r/t edema. 2+ pitting dependent edema.  Lungs: No respiratory distress. Breath sounds clear diminished with wheezes bilaterally. Oxgyen via [place] in place Abdomen: Audible bowel sounds in all quadrants. Soft, nontender, nondistended. No palpable mass.  Musculoskeletal: able to move all extremities. Limited ROM with generalized weakness noted.  Skin: Warm and dry. Jaundice.  Neurological: Alert and oriented to self. Psychiatric: Appropriate mood and affect for situation  Labs Reviewed  CBC Latest Ref Rng 03/26/2014 03/23/2014 03/22/2014  WBC - 6.1 6.7 7.0  Hemoglobin 12.0 - 16.0 g/dL 8.6(A) 8.3(L) 8.4(L)  Hematocrit 36 - 46 % 25(A) 24.8(L) 24.9(L)  Platelets 150 - 399 K/L 431(A) 418(H) 416(H)    CMP Latest Ref Rng 04/09/2014 04/05/2014 03/23/2014  Glucose 70 - 99 mg/dL - - 86  BUN 4 - 21 mg/dL 17 13 32(H)  Creatinine 0.5 - 1.1 mg/dL 1.1 1.2(A) 2.19(H)  Sodium 137 - 147 mmol/L 131(A) 129(A) 131(L)  Potassium 3.4 - 5.3 mmol/L 4.7 4.6 4.1  Chloride 96 - 112 mmol/L - - 101  CO2 19 - 32 mmol/L - - 18(L)  Calcium 8.4 - 10.5 mg/dL - - 8.0(L)  Total Protein 6.0 - 8.3 g/dL - - 5.7(L)  Total Bilirubin 0.3 - 1.2 mg/dL - - 0.6  Alkaline Phos 25 - 125 U/L 999(A) 987(A) 448(H)  AST 13 - 35 U/L 100(A) 100(A) 426(H)  ALT 7 - 35 U/L 84(A) 85(A) 186(H)   Diagnostic Studies Reviewed  03/20/2014: Ct Abdomen Pelvis w/o Contrast Small bilateral pleural effusions are noted. Trace pericardial fluid is also seen, within normal limits. Small to moderate volume ascites is seen along the abdomen and pelvis, demonstrating  mild bulging along the left lateral abdominal wall. There is a 10.6 cm cyst within the right hepatic lobe. The patient is status post cholecystectomy. The liver and spleen are grossly unremarkable in appearance, though the spleen is surrounded by fluid. The pancreas and adrenal glands are unremarkable. Relatively severe chronic right-sided hydronephrosis is noted. This appears to be secondary to a right-sided bladder mass. The left kidney has been resected, reflecting the patient's history of renal cell carcinoma. Right-sided perinephric stranding is noted. No renal or ureteral stones are seen.The small bowel is unremarkable in appearance. The stomach is within normal limits. The abdominal aorta is difficult to assess without contrast. Relatively diffuse calcification is seen along the abdominal aorta and its branches. There is diffuse soft tissue density surrounding the abdominal aorta along most of its course, concerning for matted lymphadenopathy or surrounding mass. Significantly enlarged mesenteric nodes are seen, suspicious for a metastatic process. This could conceivably reflect either the bladder mass or the patient's known history of renal cell carcinoma. The patient is status post appendectomy. Scattered diverticulosis is noted along the descending and sigmoid colon, without evidence of diverticulitis. The colon is otherwise unremarkable. The bladder is decompressed. There is an asymmetric 4.4 x 4.2 x 3.3 cm mass at the right side of the bladder, concerning for malignancy. This likely explains the patient's right-sided hydronephrosis. The patient is status post hysterectomy. No suspicious adnexal masses are seen. No inguinal lymphadenopathy is seen. Two small anterior abdominal  hernias are noted just to the right of midline, containing only fat and trace fluid. Diffuse soft tissue edema is noted along the abdominal wall, compatible with mild anasarca. No acute osseous abnormalities are identified.  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 IMPRESSION: 1. Relatively severe chronic right-sided hydronephrosis noted. This appears to be secondary to a right-sided bladder mass, as described below. Given prior left sided nephrectomy, this explains the patient's renal failure. 2. 4.4 x 4.2 x 3.3 cm mass noted arising at the right side of the bladder, concerning for primary malignancy. 3. Diffuse soft tissue density surrounding the abdominal aorta along most of its course, concerning for matted lymphadenopathy or surrounding mass. Significantly enlarged mesenteric nodes seen, suspicious for a metastatic process. 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 4. Small bilateral pleural effusions noted. 5. Small to moderate volume ascites within the abdomen and pelvis. 6. Large 10.6 cm cyst within the right hepatic lobe. 7. Two small anterior abdominal wall hernias noted just to the right of midline, containing only fat and trace fluid. 8. Relatively diffuse calcification along the abdominal aorta and its branches; the abdominal aorta is not well assessed without contrast. 9. Diffuse soft tissue edema along the abdominal wall, compatible with mild anasarca. 10. Scattered diverticulosis along the descending and sigmoid colon, without evidence of diverticulitis. 11. 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 Electronically Signed By: Garald Balding M.D On: 03/20/2014 03:22   03/08/2014: DG Chest Port 1 View: There is left base consolidation, new from 02/10/2014. This could represent pneumonia. The right lung is clear. No large effusions are evident. Pulmonary vasculature is normal. Heart size is unchanged. IMPRESSION: New left base consolidation. This could represent pneumonia. Follow-up radiography recommended to confirm complete clearing after treatment. Electronically Signed  By: Andreas Newport M.D. On: 03/08/2014 03:38   03/20/2014 Ir Nephrostomy Placement Right The back was prepped with Betadine in a sterile fashion, and a sterile drape was applied covering the operative field. A sterile gown and sterile gloves were used for the procedure. Under sonographic guidance, a 21 gauge needle was inserted into a posterior lower pole calyx and removed over a 018 wire. This was up sized to a 3 J. A 10 French nephrostomy was advanced over the wire and looped in the renal pelvis. Contrast was injected. FINDINGS: Imaging confirms access into the right kidney via posterior lower pole calyx. Final image demonstrates a 10 French nephrostomy in place. COMPLICATIONS: None IMPRESSION: Successful right percutaneous nephrostomy catheter placement. Electronically Signed By: Rodena Goldmann.D.On: 03/20/2014 15:08   Assessment & Plan 1. Physical deconditioning Continue to work with [facility] PT/OT for gait/strength/balance training to restore/maximize functions. Fall risk precautions.   2. Acute renal failure, unspecified acute renal failure type In setting of obstructive uropathy. Renal function improving. Last creatinine/bun 1.11/17.   3. Hypothyroidism, unspecified hypothyroidism type Continue levothyroxine 147mcg daily. PCP to monitor thyroid function  4. Essential hypertension Stable. Continue norvasc 10mg  daily   5. Bipolar 1 disorder, mixed, moderate Mood stable. Continue abilify 20mg  daily, wellbutrin xl 150mg  daily, trileptal 600mg  twice daily,  buspar 7.5mg  twice daily with clonazepam 0.5mg  twice daily as needed for anxiety  6. Insomnia Stable. Continue trazodone 100mg  daily at bedtime  7. Chronic obstructive pulmonary disease Stable. On chronic O2 at 2lpm via [place]. Also on CPAP at night for OSA. Continue albuterol hfa 2 puffs every four to six hours  as needed or duoneb via nebulizer every 4 hours as needed for shortness of breath and wheezing. Continue Advair 250/50cmcg  2 puffs twice daily and Turdoza 47mcg twice daily. Continue to f/u with PCP  8. Hydronephrosis of right kidney S/p nephrostomy tube placement. Continue flomax 0.4mg  daily and f/u with urology as scheduled.   9. Bladder mass S/p TURBT by Dr. Edrick Oh in [place]. Continue to f/u with Dr. Jacqlyn Larsen as scheduled.   10. Hyponatremia Improving. Na 131. Continue 1200cc fluid restriction. PCP to monitor.   11. Abnormal LFTs Spouse stated that her jaundice/discoloration of her skin is not new but is not aware that she has abnormal LFTs. Off tylenol. Encourage to f/u with PCP and oncologist  12. Bilateral leg edema Improving with fluid restriction. Unable to tolerate TED hose. Off of lasix for now due to hyponatremia. Encourage elevating BLE as much as possible. F/u with PCP  Home health services: PT/OT DME required: Nebulizer PCP follow-up: Please schedule f/u appt with PCP w/in 1-2 weeks of discharge from skilled nursing facility  30-day supply of prescription medications provided (#30 klonopin, #30 oxycodone 5mg )  Family/Staff Communication Plan of care discussed with spouse, Shanon Brow, patient, and nursing staff. Spouse, patient and nursing staff verbalized understanding and agree with plan of care. No additional questions or concerns reported.    Arthur Holms, MSN, AGNP-C [facility] [address] [place], [place] [address] [phone] [8am-5pm] After hours: [phone]

## 2014-04-10 ENCOUNTER — Ambulatory Visit
Admit: 2014-04-10 | Disposition: A | Discharge status: Home / Self Care | Payer: Self-pay | Attending: Oncology | Admitting: Oncology

## 2014-04-12 ENCOUNTER — Inpatient Hospital Stay: Payer: Self-pay | Admitting: Internal Medicine

## 2014-04-12 LAB — CBC WITH DIFFERENTIAL/PLATELET
Bands: 1 %
HCT: 24.6 % — AB (ref 35.0–47.0)
HGB: 8.4 g/dL — AB (ref 12.0–16.0)
MCH: 33.7 pg (ref 26.0–34.0)
MCHC: 34.2 g/dL (ref 32.0–36.0)
MCV: 99 fL (ref 80–100)
MONOS PCT: 5 %
Platelet: 481 10*3/uL — ABNORMAL HIGH (ref 150–440)
RBC: 2.5 10*6/uL — AB (ref 3.80–5.20)
RDW: 16.8 % — ABNORMAL HIGH (ref 11.5–14.5)
Segmented Neutrophils: 94 %
WBC: 14.6 10*3/uL — AB (ref 3.6–11.0)

## 2014-04-12 LAB — COMPREHENSIVE METABOLIC PANEL
ALT: 101 U/L — AB
AST: 188 U/L — AB
Albumin: 2.3 g/dL — ABNORMAL LOW
Alkaline Phosphatase: 1165 U/L — ABNORMAL HIGH
Anion Gap: 8 (ref 7–16)
BILIRUBIN TOTAL: 13.5 mg/dL — AB
BUN: 20 mg/dL
CO2: 23 mmol/L
Calcium, Total: 8.7 mg/dL — ABNORMAL LOW
Chloride: 99 mmol/L — ABNORMAL LOW
Creatinine: 0.86 mg/dL
EGFR (African American): >60
EGFR (Non-African Amer.): >60
Glucose: 84 mg/dL
Potassium: 4.3 mmol/L
Sodium: 130 mmol/L — ABNORMAL LOW
Total Protein: 6.1 g/dL — ABNORMAL LOW

## 2014-04-12 LAB — ETHANOL: Ethanol: <5 mg/dL

## 2014-04-12 LAB — PROTIME-INR
INR: 1.7
PROTHROMBIN TIME: 20.5 s — AB

## 2014-04-12 LAB — AMMONIA: AMMONIA, PLASMA: 48 umol/L — AB

## 2014-04-12 LAB — LACTIC ACID, PLASMA: LACTIC ACID, VENOUS: 1.1 mmol/L

## 2014-04-13 LAB — CBC WITH DIFFERENTIAL/PLATELET
Eosinophil: 3 %
HCT: 21.4 % — ABNORMAL LOW (ref 35.0–47.0)
HGB: 6.9 g/dL — ABNORMAL LOW (ref 12.0–16.0)
LYMPHS PCT: 6 %
MCH: 31.4 pg (ref 26.0–34.0)
MCHC: 32.3 g/dL (ref 32.0–36.0)
MCV: 97 fL (ref 80–100)
MONOS PCT: 6 %
PLATELETS: 445 10*3/uL — AB (ref 150–440)
RBC: 2.2 10*6/uL — ABNORMAL LOW (ref 3.80–5.20)
RDW: 16.7 % — ABNORMAL HIGH (ref 11.5–14.5)
Segmented Neutrophils: 85 %
WBC: 12.5 10*3/uL — ABNORMAL HIGH (ref 3.6–11.0)

## 2014-04-13 LAB — COMPREHENSIVE METABOLIC PANEL
ALT: 85 U/L — AB
AST: 166 U/L — AB
Albumin: 1.9 g/dL — ABNORMAL LOW
Alkaline Phosphatase: 501 U/L — ABNORMAL HIGH
Anion Gap: 7 (ref 7–16)
BUN: 19 mg/dL
Bilirubin,Total: 11.3 mg/dL — ABNORMAL HIGH
CALCIUM: 8.2 mg/dL — AB
CHLORIDE: 101 mmol/L
Co2: 23 mmol/L
Creatinine: 1 mg/dL
EGFR (African American): >60
EGFR (Non-African Amer.): 59 — ABNORMAL LOW
GLUCOSE: 82 mg/dL
POTASSIUM: 3.8 mmol/L
Sodium: 131 mmol/L — ABNORMAL LOW
TOTAL PROTEIN: 5.3 g/dL — AB

## 2014-04-14 ENCOUNTER — Encounter: Payer: Self-pay | Admitting: Family Medicine

## 2014-04-16 ENCOUNTER — Telehealth: Payer: Self-pay | Admitting: Family Medicine

## 2014-04-16 NOTE — Telephone Encounter (Signed)
Opened in [place]

## 2014-04-17 ENCOUNTER — Encounter: Payer: Self-pay | Admitting: Family Medicine

## 2014-04-17 LAB — CULTURE, BLOOD (SINGLE)

## 2014-04-20 ENCOUNTER — Ambulatory Visit
Admit: 2014-04-20 | Disposition: A | Discharge status: Home / Self Care | Payer: Self-pay | Attending: Oncology | Admitting: Oncology

## 2014-04-20 DEATH — deceased

## 2014-04-30 ENCOUNTER — Ambulatory Visit: Payer: Medicare Other | Admitting: Family Medicine

## 2014-05-01 ENCOUNTER — Other Ambulatory Visit (HOSPITAL_COMMUNITY): Payer: Medicare Other

## 2014-05-12 NOTE — Consult Note (Signed)
PATIENT NAME:  Martha Patton, Martha Patton MR#:  [id number] DATE OF BIRTH:  Apr 12, 1950  DATE OF CONSULTATION:  08/17/2013  REFERRING PHYSICIAN:   CONSULTING PHYSICIAN:  Gonzella Lex, MD  IDENTIFYING INFORMATION AND REASON FOR CONSULT: This is a 64 year old woman with a history of bipolar disorder. She was admitted to the hospital on July 28, with complaints of recent acute fainting. The consult was because of concern about whether her medications could be making her over sedated here in the hospital.   HISTORY OF PRESENT ILLNESS: Information obtained from the chart and from interview with the patient and the patient's husband. Husband reported that he found her lying on the floor passed out and brought her to the hospital. This was after having an upset stomach over the weekend and seeming to be tired. The patient stated she had not had any change to her medication. Husband backed this up and said that her medicine that she is taking had been stable without any changes. No evidence that she had been taking more or less than her usual medicine. Denied suicidal ideation. Denied that she had been depressed prior to coming into the hospital. She had some chronic grumpiness and irritability that been about the same as usual. There had not been any psychotic symptoms. Since being in the hospital, he has been diagnosed with a urinary tract infection and had intermittent worsening of kidney function, which is now improving. She has appeared to be over sedated at times and somewhat confused at times. Husband reports that this is typical for what he has seen previously when she has been sick. The patient, herself, tells me that she is feeling better today. She says that she has been feeling very tired, but she is starting to feel more alert. Denies any mood symptoms. Denies psychotic symptoms.   PAST PSYCHIATRIC HISTORY: The patient has a long-standing history of bipolar disorder and sees Dr. Bridgett Larsson as an outpatient. She cannot  remember having been on other medications in the past and thinks that her current regimen and has been helpful for her. Denies any history of suicide attempts. In the past, has had some anger problems which are under better control.   FAMILY HISTORY: None identified.   SOCIAL HISTORY: The patient lives with her husband. They have been together just a few years. They seem to have a good relationship. She does stay in close contact with extended family, including adult children, some of whom live in the area. The patient does not work outside the home.   PAST MEDICAL HISTORY: She appears to have had a major urinary tract infection this time. She has a past history of COPD, hypertension, hyperlipidemia, hypothyroidism, and respiratory problems that cause her to be on chronic oxygen therapy. She also continues to smoke a little bit even though she knows she should stop.   SUBSTANCE ABUSE HISTORY: Not drinking, not abusing drugs. Does continue to smoke.   REVIEW OF SYSTEMS: The patient says she is still feeling tired. Denies suicidal or homicidal ideation. Denies having any hallucinations. She still a little bit sick to her stomach, but that is getting better. Achy all over. Otherwise, negative review of symptoms.   MENTAL STATUS EXAM:  An ill-appearing woman interviewed in a hospital bed. Eye contact minimal. She kept her eyes shut most of the time. Speech very quiet and minimal in amount. Affect flat. Mood stated as being okay. Thoughts were slow, but fairly lucid. She did not make any bizarre or obviously delusional  statements. She was alert and oriented to where she was and when she was. She could remember 2/3 objects at 3 minutes.  Her longer term memory was intact. Fund of knowledge appeared to be normal.   LABORATORY RESULTS: On admission, she had a creatinine up to 4.35 with a BUN of 47,  glucose elevated, sodium low at 127.   Her laboratory results as of the 30th show her creatinine has come down  to 1.1, sodium normalized. Potassium normalized.   CURRENT MEDICATIONS: Psychiatric medications include BuSpar 15 mg twice a day, trazodone 150 mg at night, clonazepam 0.25 mg b.i.d., Trileptal 600 mg b.i.d., Abilify 20 mg at night and bupropion extended-release 300 mg a day.   ALLERGIES: NO KNOWN DRUG ALLERGIES.   ASSESSMENT: This is a 64 year old woman who was acutely sick with a urinary tract infection. She has somewhat precarious health to start with being oxygen dependent. In the hospital, she has appeared to be over sedated and possibly delirious at times. To my interview today, she seems like she is improving. She does not have acute psychotic symptoms and does not appear delirious. Husband reports to me that he has seen her be like this similarly when she gets sick and he has not found it very concerning. The patient is on a large amount of psychiatric medicine, but she has been stable on it for quite a while. I do note that with transient problems with kidney function, she probably had a spike in her blood levels of her medicines. Trileptal is largely eliminated ultimately by the kidneys and that would have been expected to cause an elevated level of that; probably of other medicines as well. I suspect that any delirium and sedation she was probably from a combination of that along with just the effects of the acute illness and the hospitalization.   TREATMENT PLAN: I do not recommend changing any of her medicine. I know she has gotten some Haldol when she has been agitated at night, sort of sundowning. If she has that again and it is necessary, she can do that again, but I expect she will probably be better within a day or so. I will follow up with her tomorrow.   DIAGNOSIS, PRINCIPAL AND PRIMARY:  AXIS I: Delirium, secondary to medical condition, improving.   SECONDARY DIAGNOSES:  AXIS I: Bipolar disorder, not otherwise specified, stable.  AXIS II: Deferred.  AXIS III: Acute urinary tract  infection and chronic obstructive pulmonary disease, oxygen dependent.  AXIS IV: Moderate from chronic and acute illness.  AXIS V: Functioning at time of evaluation 42.    ____________________________ Gonzella Lex, MD jtc:ts D: 08/18/2013 11:59:31 ET T: 08/18/2013 12:13:36 ET JOB#: [id number]  cc: Gonzella Lex, MD, <Dictator> Gonzella Lex MD ELECTRONICALLY SIGNED 08/30/2013 0:40

## 2014-05-12 NOTE — Consult Note (Signed)
Psychiatry: Patient seen. Spoke to family too. Patient currently more awake and alert and oriented than yesterday, but per family still nnot baseline. Would continue all regular psych meds. Dr Lianne Moris office aware.  Electronic Signatures: Gonzella Lex (MD)  (Signed on 31-Jul-15 14:31)  Authored  Last Updated: 31-Jul-15 14:31 by Gonzella Lex (MD)

## 2014-05-12 NOTE — Discharge Summary (Signed)
PATIENT NAME:  Martha Patton, Martha Patton MR#:  [id number] DATE OF BIRTH:  26-Sep-1950  DATE OF ADMISSION:  08/15/2013 DATE OF DISCHARGE:  08/21/2013  ADMITTING DIAGNOSIS: Syncope with collapse.   DISCHARGE DIAGNOSES: 1.  Syncope due to hypotension.  2.  Hypotension likely related to sepsis.  3.  Sepsis related to urinary tract infection with Escherichia coli as well as possible pneumonia.  4.  Acute renal failure, likely due to acute tubular necrosis from rhabdomyolysis and hypotension.  5.  Rhabdomyolysis.  6.  Hyponatremia due to volume depletion.  7.  Chronic obstructive pulmonary disease without any evidence of acute exacerbation.  8.  Hypertension.  9.  Hyperlipidemia.  10.  Tobacco abuse.  11.  Obstructive sleep apnea.  12.  Hypothyroidism.  6.  Urinary retention with the patient being discharged home on a Foley.  14.  Acute delirium due to sepsis, now resolved.  15.  Elevated troponin, felt to be due to demand ischemia. No evidence of myocardial infarction.  16.  Anxiety/depression.  17.  Constipation.  CONSULTANTS: Dr. Alethia Berthold, Dr. Candiss Norse.  PERTINENT LABS AND EVALUATIONS: Glucose 237, BUN 47, creatinine 4.35, sodium 127, potassium 5. Chloride was 94. CO2 22. LFTs normal except albumin of 3.1, AST 98. Troponin 0.15, 0.16, then 0.14. CPK was 4842. WBC 19.2, hemoglobin 13.6, platelet count 250,000.   Blood cultures x2 showed E. coli, pan sensitive. Urine culture also showed E. coli.   Urinalysis showed RBCs 714, WBCs 1453. Most recent creatinine 0.91.    CT scan of the head without contrast showed no acute abnormality.   Chest x-ray on admission showed no active disease. Subsequent chest x-ray on the 30th showed left lower lobe consolidation.   Ultrasound of the kidneys showed no evidence of hydronephrosis. 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 Compensatory hypertrophy of the right kidney with increased cortical echogenicity. Large right hepatic lobe cyst.   HOSPITAL COURSE: Please  refer to H and P done by the admitting physician. The patient is a 64 year old white female with chronic respiratory failure who is brought in with a syncopal episode, passing out, also some altered mental status. The patient in the ED was noted to be hypotensive, noted to have acute renal failure, noted to have a urinary tract infection as well as rhabdomyolysis. The patient was admitted, was given aggressive IV fluids, started on IV antibiotics. Subsequently, her urine cultures and blood cultures did confirm E. coli sepsis, which was pan sensitive. The patient was kept on IV antibiotics up until day of discharge. There was also some concern for pneumonia on her repeat chest x-ray; however, the last chest x-ray failed to show any pneumonia. The patient was slow to improve, but is doing much better. She also had issues with urinary retention and therefore she is being discharged with a Foley. She does follow up with a urologist as an outpatient. Initially, the plan was to send her to rehab; however, the patient at the end of the hospitalization did well and was able to ambulate in the hall. Therefore, home physical therapy and home health nurse have been arranged. At this time, she is stable for discharge.   DISCHARGE MEDICATIONS: Advair Diskus 250/50 one puff b.i.d., bupropion 15 one tab p.o. b.i.d., trazodone 150 at bedtime, clonazepam 0.5 mg 1 tab p.o. b.i.d., oxcarbazepine 600 one tab p.o. b.i.d., Abilify 20 one tab p.o. at bedtime, bupropion 300 mg 1 tab p.o. daily, levothyroxine 100 mcg daily, amlodipine/benazepril 10/20 one tab p.o. daily, benazepril 20 one tab  p.o. daily, atorvastatin 10 daily, ProAir 2 puffs 4 times a day as needed, fluticasone 50 mcg one spray b.i.d., Flomax 0.4 daily, melatonin 3 mg at bedtime, Advil 200 q. 6 p.r.n., Calcarb with vitamin D 1 tab p.o. daily, Ceftin 500 mg one tab p.o. b.i.d. for 7 more days, nicotine 21 mg daily, MiraLax 17 grams daily, Colace 100 one tab p.o. b.i.d. as  needed.   The patient is told to hold her Klor-Con, Lasix and Benicar until seen by primary care provider.   HOME HEALTH REFERRAL: Physical therapy and nurse.  HOME OXYGEN: 3 liters continuous as previously.   DISCHARGE DIET: Low-sodium, low-fat, low-cholesterol.   DISCHARGE ACTIVITY: As tolerated.  DISCHARGE FOLLOWUP: Follow with primary MD in 1 to 2 weeks. Follow with Dr. Jacqlyn Larsen in 1 to 2 weeks. Keep Foley until seen by Dr. Jacqlyn Larsen.  TIME SPENT ON DISCHARGE: 35 minutes.  ____________________________ Lafonda Mosses Posey Pronto, MD shp:sb D: 08/22/2013 08:37:10 ET T: 08/22/2013 10:00:19 ET JOB#: [id number]  cc: Kris Burd H. Posey Pronto, MD, <Dictator> Alric Seton MD ELECTRONICALLY SIGNED 08/30/2013 10:29

## 2014-05-12 NOTE — Consult Note (Signed)
Brief Consult Note: Diagnosis: delirium - resolving.   Patient was seen by consultant.   Consult note dictated.   Comments: Psychiatry: PAtient seen and chart reviewed. Patient has been more sedated and delirious than usual. Currently seems much better. Alert and oriented and lucid,.  Multifactorial. Effect of acute illness and hospitalization. Underlying predisposition to grumpiness. ALSO I suspect the transient change in creatinine clearance during her acute infection caused her blood levels of meds, esp trileptal, to go up and be more sedating.   Will not change anything now. REassess tomorrow. Expect her to have improved.  Electronic Signatures: Gonzella Lex (MD)  (Signed 30-Jul-15 15:54)  Authored: Brief Consult Note   Last Updated: 30-Jul-15 15:54 by Gonzella Lex (MD)

## 2014-05-12 NOTE — H&P (Signed)
PATIENT NAME:  Martha Patton, Martha Patton MR#:  [id number] DATE OF BIRTH:  Aug 16, 1950  DATE OF ADMISSION:  08/15/2013  REFERRING PHYSICIAN: Dr. Lisa Roca.   PRIMARY CARE PHYSICIAN: Dr. Loura Pardon.   CHIEF COMPLAINT: Syncope, passed out.   HISTORY OF PRESENT ILLNESS: This is a 64 year old female with known history of chronic obstructive pulmonary disease, tobacco abuse, chronic respiratory failure, on 2 liters nasal cannula at baseline, hypertension, and hypothyroidism, presents with syncope. The patient has been feeling weak over the last four days. She has been having diarrhea since Saturday. As well, she has been having weakness, as well as complaining of some mild abdominal pain. The patient had a wedding in the family, so she was very busy. The family reports she has not been eating or drinking well since then. The patient had 3 episodes of fainting today, while her husband was at work. When he came home at 8:30, he found her lying on the floor. The last time the patient was seen was in the morning around 7:00 a.m. The patient reports she had 3 episodes of fainting, with being on the floor a few hours. The patient denies any focal deficits, tingling, numbness, any altered mental status. Denies any head trauma as well. CT head did not show any acute findings. The patient's blood work did show multiple abnormalities, mainly being in acute renal failure with creatinine more than 4, with normal baseline. As well, she was hypotensive, which did respond to 3 liters of IV normal saline fluid boluses. She had elevated total CK, as well as elevated troponin. She denies any chest pain or shortness of breath. Her urinalysis came back positive. Hospitalist service was requested to admit the patient.  PAST MEDICAL HISTORY: 1.  Chronic obstructive pulmonary disease.  2.  Hypertension.  3.  Hyperlipidemia.  4.  Hypothyroidism.  5.  Bipolar disorder. 6.  Obstructive sleep apnea, on CPAP.  7.  Chronic respiratory  failure on 2 liters nasal cannula.    ALLERGIES: ADHESIVE TAPE.   HOME MEDICATIONS: 1.  Buspirone 15 mg 2 times daily.  2.  Trazodone 150 mg at bedtime.  3.  Clonazepam 0.5 mg one half tablet 2 times a day.  4.  Trileptal 600 mg oral 2 times a day.  5.  Abilify 20 mg at night.  6.  Wellbutrin XL 300 mg daily.  7.  Synthroid 100 mcg oral daily.  8.  Potassium 10 mEq oral 3 times a day.  9.  Lasix 40 mg daily.  10.  Amlodipine/benazepril 1/20, 1 tablet oral daily.  11.  Benicar 40 mg oral daily.  12.  Atorvastatin 10 mg oral daily.  13.  Advair 250/50, 1 puff b.i.d.  14.  Albuterol as needed.  15.  Flonase 50 mcg each nostril b.i.d.  16.  CPAP.  17.  Flomax 0.4 mg oral daily.    FAMILY HISTORY: Significant for hypertension and diabetes.   SOCIAL HISTORY: The patient still smokes a few cigarettes per day. Tried to quit multiple times. No alcohol. No illicit drug use.   REVIEW OF SYSTEMS: CONSTITUTIONAL: The patient denies fever, chills. Reports fatigue, weakness.  EYES: Denies blurry vision, double vision or inflammation.  ENT: Denies tinnitus, ear pain, wearing cochlear implant, has chronic hearing loss. Denies epistaxis or discharge.  RESPIRATORY: Denies cough, wheezing, hemoptysis.  CARDIOVASCULAR: Denies chest pain, edema, palpitation, syncope.  GASTROINTESTINAL: Denies nausea, vomiting. Had complaints of abdominal pain, and diarrhea.  GENITOURINARY: Denies dysuria, hematuria, or renal colic.  ENDOCRINE: Denies polyuria, polydipsia, heat or cold intolerance.  HEMATOLOGY: Denies anemia, easy bruising, bleeding diathesis.  INTEGUMENT: Denies acne, rash or skin lesion.  MUSCULOSKELETAL: Denies any swelling, gout, cramps.  NEUROLOGIC: Denies any history of CVA, transient ischemic attack, or headache. The patient reports multiple episodes of syncope and lightheadedness preceding that.  PSYCHIATRIC: Has history of bipolar disorder. Denies substance abuse, alcohol abuse.    PHYSICAL EXAMINATION: VITAL SIGNS: Temperature 97.6, pulse 111, respiratory rate 37, blood pressure 86/70, saturating 96% on oxygen.  GENERAL: A frail, elderly female, looks comfortable in bed, in no apparent distress.  HEENT: Head atraumatic, normocephalic. Pupils equal, reactive to light. Pink conjunctivae. Anicteric sclerae. Dry oral mucosa with cracked lips.  NECK: Supple. No thyromegaly. No JVD.  CHEST: Good air entry bilaterally. No wheezing, rales, rhonchi.  CARDIOVASCULAR: S1, S2 heard. No rubs, murmurs, or gallops.  ABDOMEN: Soft, nontender, nondistended. Bowel sounds present.  EXTREMITIES: No edema. No clubbing. No cyanosis.  PSYCHIATRIC: Appropriate affect. Awake, alert x3. Intact judgment and insight.  NEUROLOGIC: Cranial nerves grossly intact. Motor 5/5. No focal deficits.  MUSCULOSKELETAL: No joint effusion or erythema.   PERTINENT LABORATORY DATA: Glucose 237, BUN 47, creatinine 4.35, sodium 127, potassium 5, chloride 94, CO2 22. ALT 35, AST 98, alkaline phosphatase 83. Troponin 0.15, CK-MB 15.6, total CK 4842. White blood cells 19.2, hemoglobin 13.6, hematocrit 41.3, platelets 250.   IMAGING STUDIES: CT head without contrast showing no acute abnormality, bilateral cochlear implants.   ASSESSMENT AND PLAN: 1.  Syncope. This is related to her hypotension and urinary tract infection. The patient will be admitted to inpatient status. We will give her aggressive IV fluid hydration. We will hold all of her diuresis and antihypertensive medication.  2.  Acute renal failure. This is most likely due to acute tubular necrosis from rhabdomyolysis and hypotension. We will hold her Lasix. We will hold her ACE inhibitor. Continue with aggressive fluid hydration. We will consult nephrology service.  3.  Sepsis. The patient is tachycardic, hypotensive, and has leukocytosis. This is related to her urinary tract infection. The patient had blood cultures sent. We will start her on IV Rocephin.   4.  Urinary tract infection. Continue with Rocephin, follow on the urine cultures.  5.  Rhabdomyolysis. Will hold nephrotoxic agents. We will continue with aggressive hydration.  6.  Hyponatremia related to volume depletion. We will continue with IV normal saline.  7.  Chronic obstructive pulmonary disease. The patient has no wheezing. We will continue her on Advair and as-needed albuterol.  8.  Tobacco abuse. The patient was counseled.  9.  Chronic respiratory failure appears to be at baseline. We will continue with 2 liters nasal cannula.  10.  Hypertension. Blood pressure is on the lower side. We will hold all her antihypertensive medicines.  11.  Hyperlipidemia. We will hold her statins for now given her rhabdomyolysis. 12.  Obstructive sleep apnea. Will continue with nighttime CPAP.  13.  Hypothyroidism. Continue with Synthroid.  14.  Deep vein thrombosis prophylaxis. Subcutaneous heparin.   CODE STATUS: Discussed with the patient and family at bedside. The patient reports she is a do not resuscitate. She has a living will. Her husband is her healthcare power of attorney.   TOTAL TIME SPENT ON ADMISSION AND PATIENT CARE: 55 minutes.     ____________________________ Albertine Patricia, MD dse:cg D: 08/15/2013 01:27:33 ET T: 08/15/2013 02:45:52 ET JOB#: [id number]  cc: Albertine Patricia, MD, <Dictator> Taevyn Hausen Graciela Husbands MD ELECTRONICALLY SIGNED 08/15/2013 5:20

## 2014-05-20 NOTE — H&P (Signed)
PATIENT NAME:  Martha Patton, LIVAS MR#:  [id number] DATE OF BIRTH:  08/29/1950  DATE OF ADMISSION:  04/12/2014  PRIMARY CARE PHYSICIAN:  Loura Pardon, MD UROLOGIST:  Edrick Oh, MD with [facility] Urology at [facility] REQUESTING PHYSICIAN: Carrie Mew, MD   CHIEF COMPLAINT: Confusion and jaundice.   HISTORY OF PRESENT ILLNESS: The patient is a 64 year old female with a known history of diabetes, bipolar disorder, hypothyroidism, recent diagnosis of bladder cancer status post tumor removal.  He is being admitted for sepsis. The patient is completely encephalopathic, not able to provide any history. Most of the history is obtained from the patient's family member Shelbe Haglund who is the patient's healthcare power of attorney along with review of records and the ED providers. As per the patient's family member, patient has been admitted 3 times since January 23, 4 to 5 days every time in the hospital from January 23 and another one was February 18 and the last one was February 29.  On March 1, she had a nephrostomy tube placed. She was discharged to [facility] where she stayed about 20 days and just got discharged yesterday.  She had a bladder tumor removed on March 14 by Dr. Edrick Oh and was claimed to be high-grade carcinoma about 4.5 cm cube per family member. The patient had a followup appointment with Dr. Jacqlyn Larsen today where she was noted to be very confused and her nephrostomy bag was lost and was found to be very jaundiced and was requested to come down here to the Emergency Department. While in the ED, she was found to be septic with encephalopathy, elevated white count, tachycardia, and some tachypnea, and is being admitted for further evaluation and management. As per family member, patient has been intermittently using walker, but for the most part she is wheelchair bound.  Uses 2 liters oxygen all the time for her for stage IV COPD.   PAST MEDICAL HISTORY:  1.  COPD, end stage using 2 liters oxygen  all the time.  2.  Hypertension.  3.  Hyperlipidemia.  4.  Hypothyroidism. 5.  Bipolar disorder.  6.  Obstructive sleep apnea on CPAP.   7.  Bladder cancer status post tumor resection by Dr. Jacqlyn Larsen on March 14. 8.  Hiatal hernia.   PAST SURGICAL HISTORY:  1.  Hysterectomy.  2.  Tonsillectomy. 3.  Pleural effusion.  4.  Appendectomy.   ALLERGIES: EZETMIBE, FLUVASTATIN, NAPROSYN, ADHESIVE TAPE.  FAMILY HISTORY:  Positive for hypertension and diabetes.  SOCIAL HISTORY: She is an active smoker, smokes 3 cigarettes per day, trying to quit.  No alcohol, no illicit drug use. She was just discharged from [facility]  Was there about 20 days.   REVIEW OF SYSTEMS: Unobtainable as patient is completely obtunded.   MEDICATIONS AT HOME:  1. Abilify 20 mg p.o. at bedtime.  2. Advair 250/50 one puff b.i.d.  3. DuoNeb inhaled 4 times a day as needed.  4. Amlodipine 10 mg p.o. daily.  5. Atorvastatin 10 mg p.o. daily.  6. Bupropion 150 mg p.o. daily.  7. Buspirone 7.5 mg p.o. b.i.d.  8. Cerovite once daily.  9. Clonazepam 0.5 mg p.o. 1/2 tablet b.i.d.  10. Flonase 1 spray intranasally daily.  11. Levothyroxine 100 mcg p.o. daily.  12. Oxcarbazepine 600 mg p.o. b.i.d.  13. Oxycodone 5 mg p.o. every 6 hours as needed.  14. ProAir 2 puffs inhaled every 6 hours as needed. 15. Tamsulosin 0.4 mg p.o. daily.  16. Trazodone 150  mg p.o. at bedtime.  17. Tudorza 1 puff inhaled twice a day.   PHYSICAL EXAMINATION:   VITAL SIGNS: Temperature 98.2, heart rate 105 per minute, respirations 20 per minute, blood pressure 130/82. She is saturating 97% on 2 liters oxygen via nasal cannula.  GENERAL: The patient is a 64 year old female lying in the bed completely obtunded and jaundiced.  EYES: Pupils equal, round, reactive to light and accommodation. She does have scleral icterus. Extraocular muscles intact.  HEENT: Head atraumatic, normocephalic. Oropharynx and nasopharynx clear.  NECK: Supple.  No jugular venous distention. No thyroid enlargement or tenderness.  LUNGS: Decreased breath sounds at the bases. She does have some rales at the right lower base. No wheezing or crepitation.  CARDIOVASCULAR: S1, S2 normal, tachycardic. No murmurs, rubs, or gallop.  ABDOMEN: Soft, nontender, nondistended. Bowel sounds present. No organomegaly or mass.  EXTREMITIES: She has 2 to 3+ pedal edema bilaterally. No cyanosis or clubbing.  MUSCULOSKELETAL: No joint effusion or tenderness.  SKIN: Jaundiced all over her chest and upper part of torso along with face. NEUROLOGIC: She is obtunded, not able to cooperate with any further exam. PSYCHIATRY: The patient is not able to be assessed as she is obtunded.   LABORATORY PANEL:  Normal BMP except sodium 130, chloride of 99.  LFTs showed total bilirubin of 13.5, alkaline phosphatase is 1165, AST 188, ALT 101. CBC showed white count of 14.6, hemoglobin 8.4, hematocrit 24.6, platelet 481,000.  PT of 20.5, INR 1.7. CT scan of the head without contrast in the ER showed no acute intracranial abnormality. CT scan of the abdomen, chest, and pelvis in the ER showed new severe intra and extrahepatic biliary ductal dilatation which is new from March 1.  The obstruction seemed to be at distal common bile duct level. Evidence of extensive retroperitoneal and mesenteric lymphadenopathy worrisome for malignant etiology. Continued anasarca. Patchy right upper and lower lung opacity worrisome for multilobar pneumonia.   IMPRESSION AND PLAN:  1. Sepsis with leukocytosis, encephalopathy, tachycardia, tachypnea due to pneumonia. Will obtain blood culture, obtain sputum culture, start on IV vancomycin and Zosyn, consult infectious disease.  2. Acute encephalopathy likely due to sepsis, could be multifactorial in nature.  3. Acute elevation of liver function tests likely due to biliary ductal obstruction.  Will get a gastrointestinal consultation to see if this can be relieved. This  is likely from underlying malignancy. Will consult oncology and gastrointestinal.  4. New severe intra and extrahepatic biliary ductal dilatation likely from underlying malignancy and common bile duct obstruction.  Will consult oncology.   CODE STATUS: DO NOT RESUSCITATE. Nurse may pronounce.   Will consult palliative care. The case was discussed with family at the bedside. The patient's healthcare power of attorney is Mr. Cherly Erno.  His cell phone number is [phone].  They do understand the severe nature of this patient's clinical illness and likely she will have a very poor prognosis. She may be hospice appropriate, she may get coded.  Total time spent (Critical): 45 mins  ____________________________ Olon Russ S. Manuella Ghazi, MD vss:sp D: 04/12/2014 15:57:00 ET T: 04/12/2014 16:27:48 ET JOB#: [id number]  cc: Marne A. Glori Bickers, MD Denice Bors. Jacqlyn Larsen, MD Rustyn Conery S. Manuella Ghazi, MD, <Dictator>  Lucina Mellow [facility] MD ELECTRONICALLY SIGNED 04/13/2014 15:04

## 2014-05-20 NOTE — Consult Note (Signed)
PATIENT NAME:  Martha Patton, GLADE MR#:  [id number] DATE OF BIRTH:  1950-12-06  DATE OF CONSULTATION:  04/12/2014  REFERRING PHYSICIAN:  Dr Willette Pa CONSULTING PHYSICIAN:  Blayn Whetsell S. Manuella Ghazi, MD  REQUESTING PHYSICIAN: Carrie Mew, M.D.   CHIEF COMPLAINT: Confusion and jaundice.   HISTORY OF PRESENT ILLNESS: The patient is a 64 year old female with a known history of diabetes, bipolar disorder, hypothyroidism, recent diagnosis of bladder cancer status post tumor removal.  He is being admitted for sepsis. The patient is completely encephalopathic, not able to provide any history. Most of the history is obtained from the patient's family member Layli Capshaw who is the patient's healthcare power of attorney along with review of records and the ED providers. As per the patient's family member, patient has been admitted 3 times since January 23, 4 to 5 days every time in the hospital from January 23 and another one was February 18 and the last one was February 29.  On March 1, she had a nephrostomy tube placed. She was discharged to [facility] where she stayed about 20 days and just got discharged yesterday.  She had a bladder tumor removed on March 14 by Dr. Edrick Oh and was claimed to be high-grade carcinoma about 4.5 cm cube per family member. The patient had a followup appointment with Dr. Jacqlyn Larsen today where she was noted to be very confused and her nephrostomy bag was lost and was found to be very jaundiced and was requested to come down here to the Emergency Department. While in the ED, she was found to be septic with encephalopathy, elevated white count, tachycardia, and some tachypnea, and is being admitted for further evaluation and management. As per family member, patient has been intermittently using walker, but for the most part she is wheelchair bound.  Uses 2 liters oxygen all the time for her for stage IV COPD.   PAST MEDICAL HISTORY:  1.  COPD, end stage using 2 liters oxygen all the  time.  2.  Hypertension.  3.  Hyperlipidemia.  4.  Hypothyroidism. 5.  Bipolar disorder.  6.  Obstructive sleep apnea on CPAP.   7.  Bladder cancer status post tumor resection by Dr. Jacqlyn Larsen on March 14. 8.  Hiatal hernia.   PAST SURGICAL HISTORY:  1.  Hysterectomy.  2.  Tonsillectomy. 3.  Pleural effusion.  4.  Appendectomy.   ALLERGIES: Ezetmibe, NAPROSYN,ADHESIVE TAPE.  FAMILY HISTORY:  Positive for hypertension and diabetes.  SOCIAL HISTORY: She is an active smoker, smokes 3 cigarettes per day, trying to quit.  No alcohol, no illicit drug use. She was just discharged from [facility].  Was there about 20 days.   REVIEW OF SYSTEMS: Unobtainable as patient is completely obtunded.   MEDICATIONS AT HOME:  1.  Abilify 20 mg p.o. at bedtime.  2.  Advair 250/50 one puff b.i.d.  3.  DuoNeb inhaled 4 times a day as needed.  4.  Amlodipine 10 mg p.o. daily.  5.  Atorvastatin 10 mg p.o. daily.  6.  Bupropion 150 mg p.o. daily.  7.  Buspirone 7.5 mg p.o. b.i.d.  8.  Cerovite once daily.  9.  Clonazepam 0.5 mg p.o. 1/2 tablet b.i.d.  10.  Flonase 1 spray intranasally daily.  11.  Levothyroxine 100 mcg p.o. daily.  12. Oxcarbazepine 600 mg p.o. b.i.d.  13.  Oxycodone 5 mg p.o. every 6 hours as needed.  14.  ProAir 2 puffs inhaled every 6 hours as needed. 15.  Tamsulosin 0.4 mg p.o. daily.  16.  Trazodone 150 mg p.o. at bedtime.  17.  Tudorza 1 puff inhaled twice a day.   PHYSICAL EXAMINATION:   VITAL SIGNS: Temperature 98.2, heart rate 105 per minute, respirations 20 per minute, blood pressure 130/82. She is saturating 97% on 2 liters oxygen via nasal cannula.  GENERAL: The patient is a 64 year old female lying in the bed completely obtunded and jaundiced.  EYES: Pupils equal, round, reactive to light and accommodation. She does have scleral icterus. Extraocular muscles intact.  HEENT: Head atraumatic, normocephalic. Oropharynx and nasopharynx clear.  NECK: Supple. No  jugular venous distention. No thyroid enlargement or tenderness.  LUNGS: Decreased breath sounds at the bases. She does have some rales at the right lower base. No wheezing or crepitation.  CARDIOVASCULAR: S1, S2 normal, tachycardic. No murmurs, rubs, or gallop.  ABDOMEN: Soft, nontender, nondistended. Bowel sounds present. No organomegaly or mass.  EXTREMITIES: She has 2 to 3+ pedal edema bilaterally. No cyanosis or clubbing.  MUSCULOSKELETAL: No joint effusion or tenderness.  SKIN: Jaundiced all over her chest and upper part of torso along with face. NEUROLOGIC: She is obtunded, not able to cooperate with any further exam. PSYCHIATRY: The patient is not able to be assessed as she is obtunded.   LABORATORY PANEL:  Normal BMP except sodium 130, chloride of 99.  LFTs showed total bilirubin of 13.5, alkaline phosphatase is 1165, AST 188, ALT 101. CBC showed white count of 14.6, hemoglobin 8.4, hematocrit 24.6, platelet 481,000.  PT of 20.5, INR 1.7. CT scan of the head without contrast in the ER showed no acute intracranial abnormality. CT scan of the abdomen, chest, and pelvis in the ER showed new severe intra and extrahepatic biliary ductal dilatation which is new from March 1.  The obstruction seemed to be at distal common bile duct level. Evidence of extensive retroperitoneal and mesenteric lymphadenopathy worrisome for malignant etiology. Continued anasarca. Patchy right upper and lower lung opacity worrisome for multilobar pneumonia.   IMPRESSION AND PLAN:  1.  Sepsis with leukocytosis, encephalopathy, tachycardia, tachypnea due to pneumonia. Will obtain blood culture, obtain sputum culture, start on IV vancomycin and Zosyn, consult infectious disease.  2.  Acute encephalopathy likely due to sepsis, could be multifactorial in nature.  3.  Acute elevation of liver function tests likely due to biliary ductal obstruction.  Will get a gastrointestinal consultation to see if this can be relieved. This  is likely from underlying malignancy. Will consult oncology and gastrointestinal.  4.  New severe intra and extrahepatic biliary ductal dilatation likely from underlying malignancy and common bile duct obstruction.  Will consult oncology.  CODE STATUS: DO NOT RESUSCITATE.  NURSE MAY PRONOUNCE.   Will consult palliative care. The case was discussed with family at the bedside. The patient's healthcare power of attorney is Mr. Consandra Laske.  His cell phone number is [phone].  They do understand the severe nature of this patient's clinical illness and likely she will have a very poor prognosis. She may be hospice appropriate, she may get coded.   Total time spent (Critical): 45 mins ____________________________ Cristhian Vanhook S. Manuella Ghazi, MD vss:sp D: 04/12/2014 15:57:40 ET T: 04/12/2014 16:27:48 ET JOB#: [id number]  cc: Raymond Azure S. Manuella Ghazi, MD, <Dictator> [place]. Glori Bickers, MD Denice Bors. Jacqlyn Larsen, MD

## 2014-05-20 NOTE — Discharge Summary (Signed)
PATIENT NAME:  Martha Patton, Martha Patton MR#:  [id number] DATE OF BIRTH:  08-20-1950  DATE OF ADMISSION:  04/12/2014 DATE OF DISCHARGE:    DISCHARGE DIAGNOSES:  1.  Stage 4 bladder cancer.  2.  End-stage chronic obstructive pulmonary disease.  3.  Sepsis due to pneumonia.   SECONDARY DIAGNOSES:   1.  COPD, end stage using 2 liters oxygen all the time.  2.  Hypertension.  3.  Hyperlipidemia.  4.  Hypothyroidism. 5.  Bipolar disorder.  6.  Obstructive sleep apnea on CPAP.   7.  Bladder cancer status post tumor resection by Dr. Jacqlyn Larsen on March 14. 8.  Hiatal hernia.  CONSULTATIONS:  Palliative care, Dr. Izora Gala Phifer.   PROCEDURES AND RADIOLOGY: CT scan of the chest, abdomen, and pelvis without contrast on March 24 showed new severe intra- and extrahepatic biliary ductal dilatation with complete obstruction of the distal CBD. Evidence of extensive retroperitoneal and mesenteric lymphadenopathy raising suspicion for malignant etiology. Interval right kidney drain placed with resolved right hydronephrosis. Anasarca. Patchy right multilobar pneumonia.   CT scan of the head without contrast in the ED showed no acute intracranial abnormality.   MAJOR LABORATORY PANEL: Blood cultures x 2 were negative on admission.   HISTORY AND SHORT HOSPITAL COURSE: The patient is a 64 year old female with the abovementioned medical problems, who was admitted for confusion and clinical jaundice. Please see Dr. Trena Platt dictated history and physical for further details. The patient was found to be septic present on admission clinical and laboratory criteria, was started on broad-spectrum antibiotic.  Concerning her extensive end-stage COPD and metastatic bladder cancer along with complete obstruction of CBD with multiorgan failure palliative care consultation was obtained.  They had a discussion with the patient and family, decision was made to make her comfort care and transfer to hospice home where she is being discharged in  fair condition.   On the date of discharge her vital signs are as follows: Temperature 98.1, heart rate 113 per minute, respirations 20 per minute; blood pressure 89/59. She is saturating 95% on 3 liters oxygen via nasal cannula.   PERTINENT PHYSICAL EXAMINATION ON THE DATE OF DISCHARGE:  CARDIOVASCULAR: Tachycardic. S1, S2 normal. No murmurs, rubs, or gallop.  LUNGS: Decreased breath sounds at the bases, wheezing and rhonchi present.  ABDOMEN: Distended, hypoactive bowel sounds and firm belly.  EXTREMITIES: 2-3 + pedal edema.  SKIN:  Jaundice.  NEUROLOGIC: Bilateral hearing loss, nonfocal examination.  PSYCHIATRIC: Nonfocal examination.   All other physical examination remained at baseline.   DISCHARGE MEDICATIONS:   Medication Instructions  trazodone 150 mg oral tablet  1 tab(s) orally once a day (at bedtime)   clonazepam 0.5 mg oral tablet  0.5 tab(s) orally 2 times a day   abilify 20 mg oral tablet  1 tab(s) orally once a day (at bedtime)   buspirone 7.5 mg oral tablet  1 tab(s) orally 2 times a day   albuterol-ipratropium 2.5 mg-0.5 mg/3 ml inhalation solution  3 milliliter(s) inhaled 4 times a day, As Needed - for Shortness of Breath   morphine 20 mg/ml oral concentrate  0.5 milliliter(s) orally every 1 to 2 hours   zofran odt 4 mg oral tablet, disintegrating  1 tab(s) orally every 6 hours, As Needed- for Nausea, Vomiting     DISCHARGE DIET: As tolerated.   DISCHARGE ACTIVITY: As tolerated.   DISCHARGE INSTRUCTIONS AND FOLLOWUP: The patient was instructed to follow up with her primary care physician at hospice facility. She will need  2 liters oxygen continuous for now at facility. Medication to be crushed or use liquid when appropriate.   CODE STATUS: DNR.    TOTAL TIME DISCHARGING THIS PATIENT: 45 minutes.    ____________________________ Lucina Mellow. Manuella Ghazi, MD vss:bu D: 04/13/2014 15:45:41 ET T: 04/13/2014 16:09:12 ET JOB#: [id number]  cc: Gina Leblond S. Manuella Ghazi, MD,  <Dictator> [place]. Glori Bickers, MD Denice Bors. Jacqlyn Larsen, MD Efraim Kaufmann, MD [place] MD ELECTRONICALLY SIGNED 04/13/2014 18:26

## 2014-05-20 NOTE — Consult Note (Signed)
Note Type Consult   Subjective: Chief Complaint/Diagnosis:   Stage IV bladder cancer, declining performance status, hyperbilirubinemia, confusion. HPI:   Patient is a 64 year old female who recently underwent cystectomy for locally advanced urothelial carcinoma. She was also recently admitted to hospital [facility] in acute renal failure and also required intubation. She was initially evaluated in the [place] several days ago she continues to be significantly jaundiced and confused. Her family is present at the bedside. No aggressive treatments are planned and patient is being discharged to hospice home later today.    Review of Systems:  Performance Status (ECOG): 4  Review of Systems:   unable to obtain.  Allergies:  Other -Explain in Comment: Hives  Adhesive: Blisters, Rash  Tape: Hives, Blisters  ezetimibe: Unknown  Fluvastatin: Unknown  Naprosyn: Unknown  PFSH: Additional Past Medical and Surgical History: renal cell carcinoma status post nephrectomy approximately 12 years ago, diabetes, acute cystitis, depression, hyperlipidemia, DJD, hypothyroidism, hypertension, mastoid surgery, parotid gland removal, bilateral cochlear implants, hysterectomy, tonsillectomy, neck surgery, appendectomy.    Family history: No report of any malignancy or chronic disease.    Social history: Positive tobacco, unclear how much. Denies alcohol.   Home Medications: Medication Instructions Last Modified Date/Time  Zofran ODT 4 mg oral tablet, disintegrating 1 tab(s) orally every 6 hours, As Needed- for Nausea, Vomiting  25-Mar-16 11:19  morphine 20 mg/mL oral concentrate 0.5 milliliter(s) orally every 1 to 2 hours 25-Mar-16 11:19  traZODone 150 mg oral tablet 1 tab(s) orally once a day (at bedtime) 25-Mar-16 11:19  clonazepam 0.5 mg oral tablet 0.5 tab(s) orally 2 times a day 25-Mar-16 11:19  Abilify 20 mg oral tablet 1 tab(s) orally once a day (at bedtime) 25-Mar-16 11:19  busPIRone 7.5 mg  oral tablet 1 tab(s) orally 2 times a day 25-Mar-16 11:19  albuterol-ipratropium 2.5 mg-0.5 mg/3 mL inhalation solution 3 milliliter(s) inhaled 4 times a day, As Needed - for Shortness of Breath 25-Mar-16 11:19   Vital Signs:  :: vital signs stable, patient afebrile.   Physical Exam:  General: ill-appearing, jaundiced.  Mental Status: lethargic, confused.  Eyes: icteric sclera  Head, Ears, Nose,Throat: cochlear implants not present on today's exam  Respiratory: clear to auscultation bilaterally  Cardiovascular: regular rate and rhythm, no murmur, rub, or gallop  Gastrointestinal: normal active bowel sounds  Genitourinary: urostomy tubes noted.  Musculoskeletal: No edema  Skin: jaundice  Neurological: confused.   Laboratory Results: Hepatic:  25-Mar-16 04:41   Bilirubin, Total  11.3 (0.3-1.2 NOTE: New Reference Range  03/27/14)  Alkaline Phosphatase  501 (38-126 NOTE: New Reference Range  03/27/14)  SGPT (ALT)  85 (14-54 NOTE: New Reference Range  03/27/14)  SGOT (AST)  166 (15-41 NOTE: New Reference Range  03/27/14)  Total Protein, Serum  5.3 (6.5-8.1 NOTE: New Reference Range  03/27/14)  Albumin, Serum  1.9 (3.5-5.0 NOTE: New reference range  03/27/14)  Routine Chem:  25-Mar-16 04:41   Glucose, Serum 82 (65-99 NOTE: New Reference Range  03/27/14)  BUN 19 (6-20 NOTE: New Reference Range  03/27/14)  Creatinine (comp) 1.00 (0.44-1.00 NOTE: New Reference Range  03/27/14)  Sodium, Serum  131 (135-145 NOTE: New Reference Range  03/27/14)  Potassium, Serum 3.8 (3.5-5.1 NOTE: New Reference Range  03/27/14)  Chloride, Serum 101 (101-111 NOTE: New Reference Range  03/27/14)  CO2, Serum 23 (22-32 NOTE: New Reference Range  03/27/14)  Calcium (Total), Serum  8.2 (8.9-10.3 NOTE: New Reference Range  03/27/14)  eGFR (African American) >60  eGFR (  Non-African American)  59 (eGFR values <90m/min/1.73 m2 may be an indication of chronic kidney disease  (CKD). Calculated eGFR is useful in patients with stable renal function. The eGFR calculation will not be reliable in acutely ill patients when serum creatinine is changing rapidly. It is not useful in patients on dialysis. The eGFR calculation may not be applicable to patients at the low and high extremes of body sizes, pregnant women, and vegetarians.)  Anion Gap 7  Routine Hem:  25-Mar-16 04:41   WBC (CBC)  12.5  RBC (CBC)  2.20  Hemoglobin (CBC)  6.9  Hematocrit (CBC)  21.4  Platelet Count (CBC)  445 (Result(s) reported on 13 Apr 2014 at 05:54AM.)  MCV 97  MCH 31.4  MCHC 32.3  RDW  16.7  Segmented Neutrophils 85  Lymphocytes 6  Monocytes 6  Eosinophil 3  Diff Comment 1 ANISOCYTOSIS  Diff Comment 2 HYPOCHROMIA  Diff Comment 3 PLTS VARIED IN SIZE  Result(s) reported on 13 Apr 2014 at 05:54AM.   Medical Imaging Results:   Review Medical Imaging   Head Without Contrast 12-Apr-2014 13:20:00: IMPRESSION:  No acute intracranial abnormality. Stable cerebral atrophy.  Bilateral cochlear implants again noted. There is mucosal thickening  with air-fluid level in right maxillary sinus. Complete  opacification of right mastoid air cells.      Electronically Signed    By: LLahoma CrockerM.D.    On: 04/12/2014 13:56         Verified By: LEphraim Hamburger M.D., Chest Abdomen and Pelvis WO 12-Apr-2014 13:20:00: IMPRESSION:  1. New severe intra- and extrahepatic biliary ductal dilatation  since 03/20/14. Obstruction appears to be at the distal CBD level, but  a discrete obstructing lesion is not delineated.  2. Evidence of extensive retroperitoneal and mesenteric  lymphadenopathy again noted as on 03/20/14, raising suspicion for  malignant etiology CBD obstruction in #1.  3. Interval right kidney internal external drain placed with  resolved right hydronephrosis.  4. Continued anasarca. Stable small volume abdominal and pelvic.  Ascites regressed bilateral pleural effusions, the larger on the  left.  5. Patchy  right upper and lower lung opacity suspicious for  developing multilobar pneumonia.      Electronically Signed    By: HGenevie AnnM.D.    On: 04/12/2014 14:09         Verified By: HGwenyth Bender HALL, M.D.,  Assessment and Plan: Impression:   Stage IV bladder cancer. Plan:   1. Bladder cancer: Imaging and laboratory independently reviewed and as reported above.  No aggressive measures are being taken and patient is being transferred to the hospice home later today. Discussed at length with the family and they are comfortable with this plan.  Fax to Physician:  [facility] Fax: [place], MWynelle Fanny MD [phone]  CC Referral:  cc: Dr. CJacqlyn Larsen   Tumor Staging:  Tumor Staging Tumor Staging   Tumor TX   Node N3   Metastasis M1   Stage IV   Electronic Signatures: FDelight Hoh(MD)  (Signed 25-Mar-16 18:04)  Authored: Note Type, CC/HPI, Review of Systems, ALLERGIES, Patient Family Social History, HOME MEDICATIONS, Vital Signs, Physical Exam, Lab Results Review, Rad Results Review, Assessment and Plan, Fax to Physician, CC Referring Physician, Quality Measures   Last Updated: 25-Mar-16 18:04 by FDelight Hoh(MD)

## 2014-06-11 ENCOUNTER — Ambulatory Visit: Payer: Medicare Other | Admitting: Family Medicine

## 2015-12-06 IMAGING — CT CT ABD-PELV W/O CM
2 of 4 series · 14 of 46 positions shown, 16 images · non-contrast
Comparison: None.

CLINICAL DATA: Acute onset of suprapubic abdominal pain.
Significantly worsening creatinine. Initial encounter.

EXAM:
CT ABDOMEN AND PELVIS WITHOUT CONTRAST
TECHNIQUE: Multidetector CT imaging of the abdomen and pelvis was performed
following the standard protocol without IV contrast.

[Series 2: abd/ pelvis 5.0 i30f 1 · axial · 0.76mm/px · z∈[-439,-54]mm · 11 of 93 slices shown, 13 images]
[im 8/93  soft-tissue]
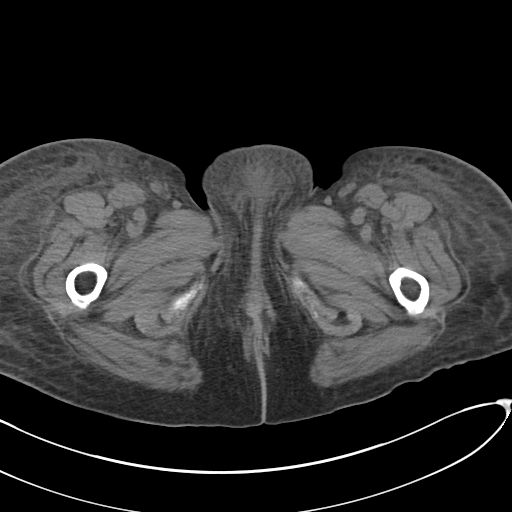
[im 8/93  bone]
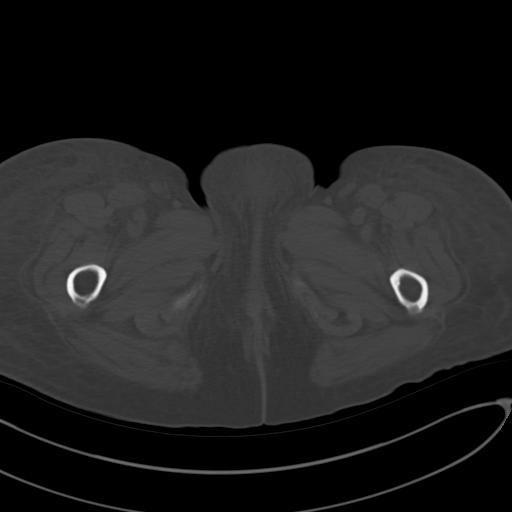
[im 15/93  soft-tissue]
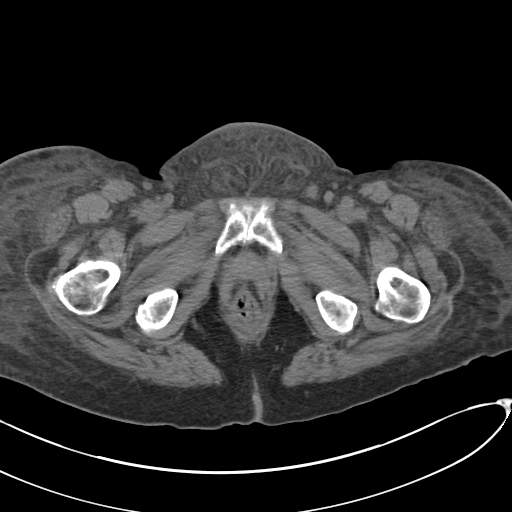
[im 23/93  soft-tissue]
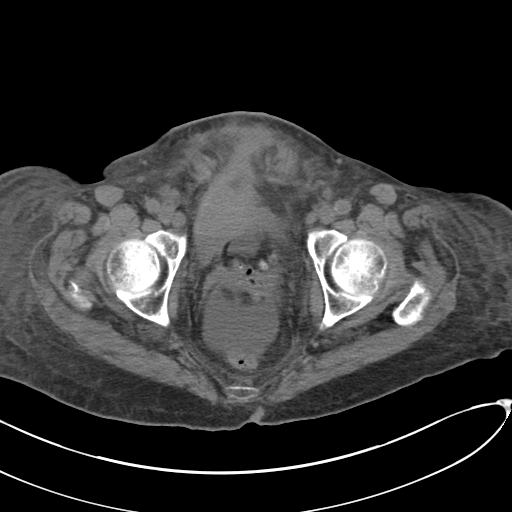
[im 30/93  soft-tissue]
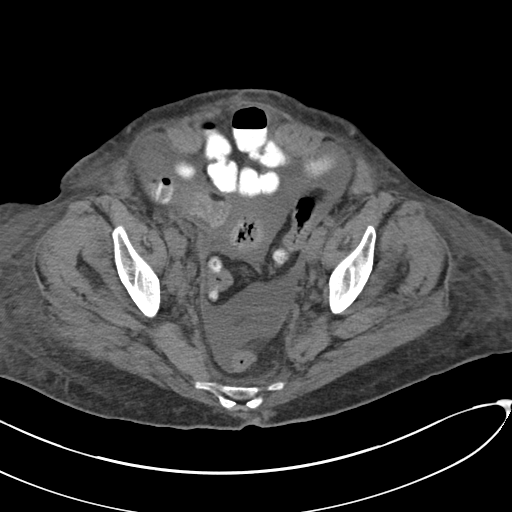
[im 37/93  soft-tissue]
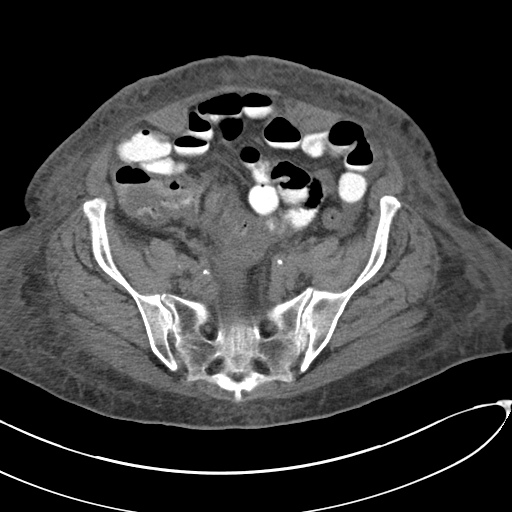
[im 48/93  soft-tissue]
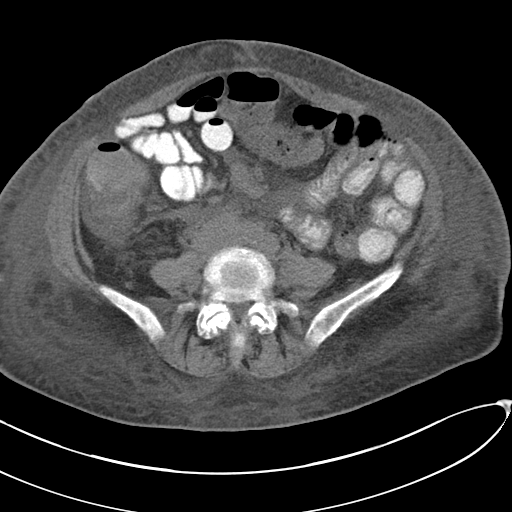
[im 56/93  soft-tissue]
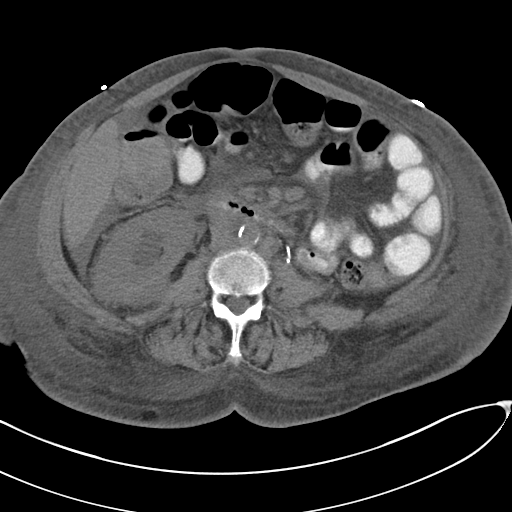
[im 63/93  soft-tissue]
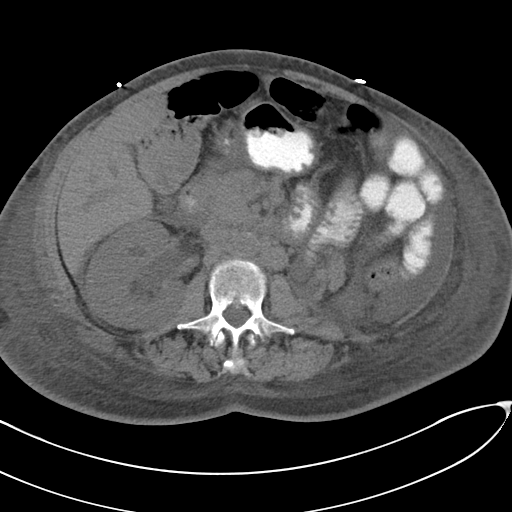
[im 70/93  soft-tissue]
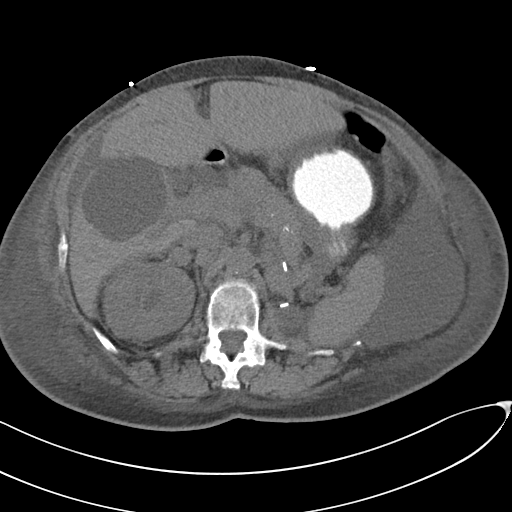
[im 70/93  bone]
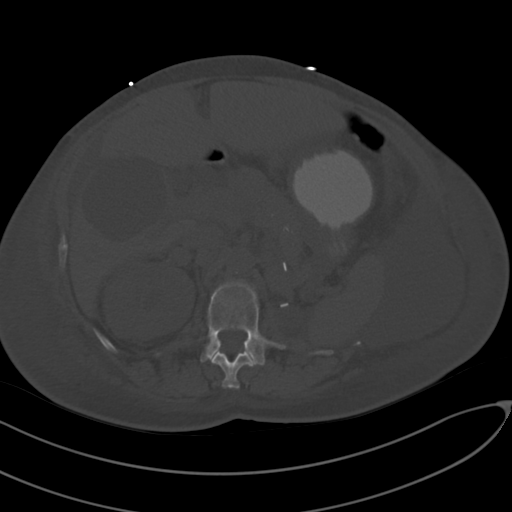
[im 78/93  soft-tissue]
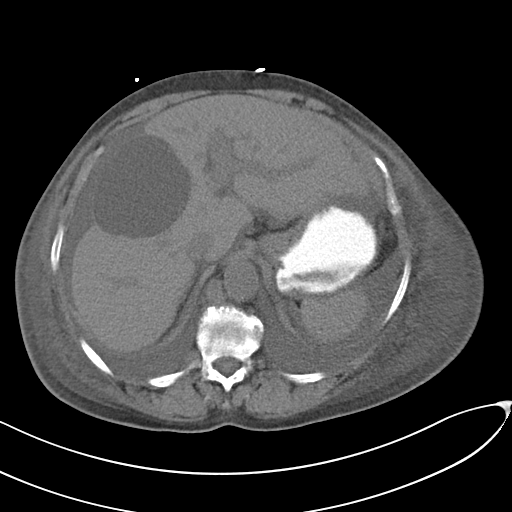
[im 85/93  soft-tissue]
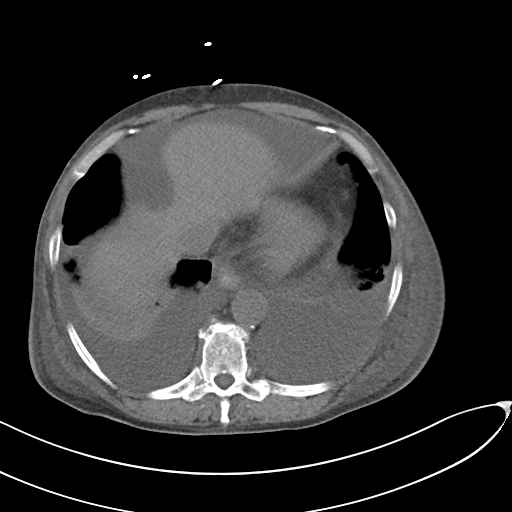

[Series 5: coronals · coronal · 0.86mm/px · 3 of 143 slices shown]
[im 48/143  soft-tissue]
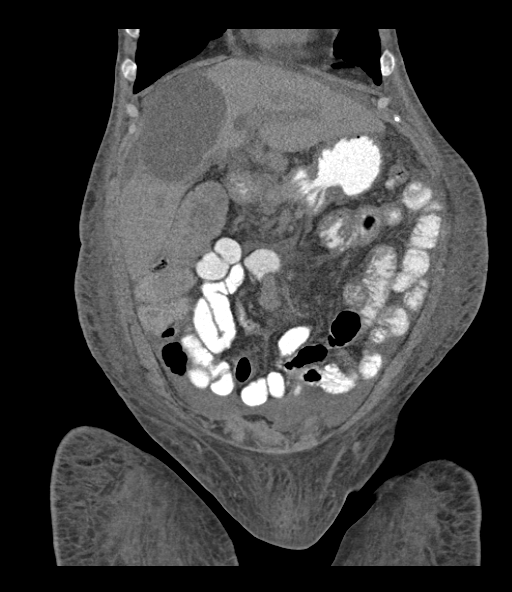
[im 64/143  soft-tissue]
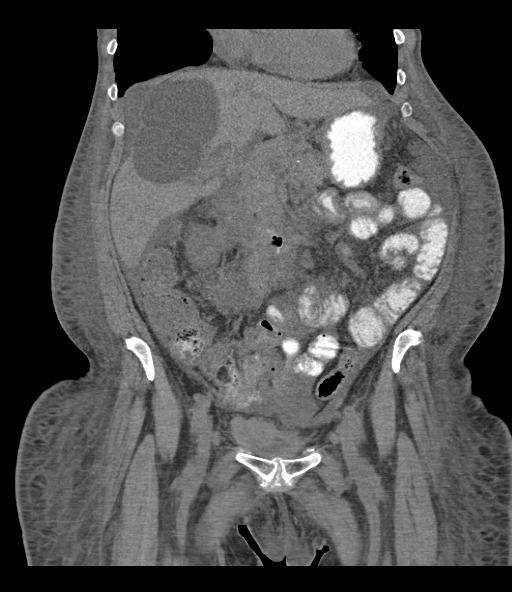
[im 79/143  soft-tissue]
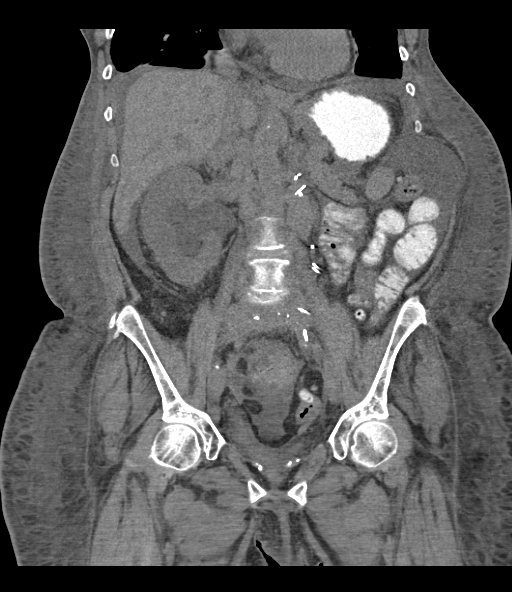

[14 of 46 positions shown; findings below may reference images not displayed]

FINDINGS: Small bilateral pleural effusions are noted. Trace pericardial fluid
is also seen, within normal limits.

Small to moderate volume ascites is seen along the abdomen and
pelvis, demonstrating mild bulging along the left lateral abdominal
wall.

There is a 10.6 cm cyst within the right hepatic lobe. The patient
is status post cholecystectomy. The liver and spleen are grossly
unremarkable in appearance, though the spleen is surrounded by
fluid. The pancreas and adrenal glands are unremarkable.

Relatively severe chronic right-sided hydronephrosis is noted. This
appears to be secondary to a right-sided bladder mass. The left
kidney has been resected, reflecting the patient's history of renal
cell carcinoma. Right-sided perinephric stranding is noted. No renal
or ureteral stones are seen.

The small bowel is unremarkable in appearance. The stomach is within
normal limits.

The abdominal aorta is difficult to assess without contrast.
Relatively diffuse calcification is seen along the abdominal aorta
and its branches. There is diffuse soft tissue density surrounding
the abdominal aorta along most of its course, concerning for matted
lymphadenopathy or surrounding mass. Significantly enlarged
mesenteric nodes are seen, suspicious for a metastatic process. This
could conceivably reflect either the bladder mass or the patient's
known history of renal cell carcinoma.

The patient is status post appendectomy. Scattered diverticulosis is
noted along the descending and sigmoid colon, without evidence of
diverticulitis. The colon is otherwise unremarkable.

The bladder is decompressed. There is an asymmetric 4.4 x 4.2 x
cm mass at the right side of the bladder, concerning for malignancy.
This likely explains the patient's right-sided hydronephrosis. The
patient is status post hysterectomy. No suspicious adnexal masses
are seen. No inguinal lymphadenopathy is seen.

Two small anterior abdominal hernias are noted just to the right of
midline, containing only fat and trace fluid.

Diffuse soft tissue edema is noted along the abdominal wall,
compatible with mild anasarca.

No acute osseous abnormalities are identified. A chronic left-sided
pars defect is noted at L5, without evidence of anterolisthesis.
IMPRESSION: 1. Relatively severe chronic right-sided hydronephrosis noted. This
appears to be secondary to a right-sided bladder mass, as described
below. Given prior left sided nephrectomy, this explains the
patient's renal failure.
2. 4.4 x 4.2 x 3.3 cm mass noted arising at the right side of the
bladder, concerning for primary malignancy.
3. Diffuse soft tissue density surrounding the abdominal aorta along
most of its course, concerning for matted lymphadenopathy or
surrounding mass. Significantly enlarged mesenteric nodes seen,
suspicious for a metastatic process. Given the remote history of
renal cell carcinoma, this more likely reflects the primary bladder
malignancy.
4. Small bilateral pleural effusions noted.
5. Small to moderate volume ascites within the abdomen and pelvis.
6. Large 10.6 cm cyst within the right hepatic lobe.
7. Two small anterior abdominal wall hernias noted just to the right
of midline, containing only fat and trace fluid.
8. Relatively diffuse calcification along the abdominal aorta and
its branches; the abdominal aorta is not well assessed without
contrast.
9. Diffuse soft tissue edema along the abdominal wall, compatible
with mild anasarca.
10. Scattered diverticulosis along the descending and sigmoid colon,
without evidence of diverticulitis.
11. Chronic left-sided pars defect at L5, without evidence of
anterolisthesis.

## 2015-12-06 IMAGING — US IR NEPHROSTOGRAM INI PLACEMENT RIGHT
1 series · 1 of 1 positions shown · non-contrast
Comparison: none

CLINICAL DATA: Right ureteral obstruction.  Bladder mass.

[Series 1: ir nephrostomy placement right · 1 of 1 slices shown]
[im 1/1]
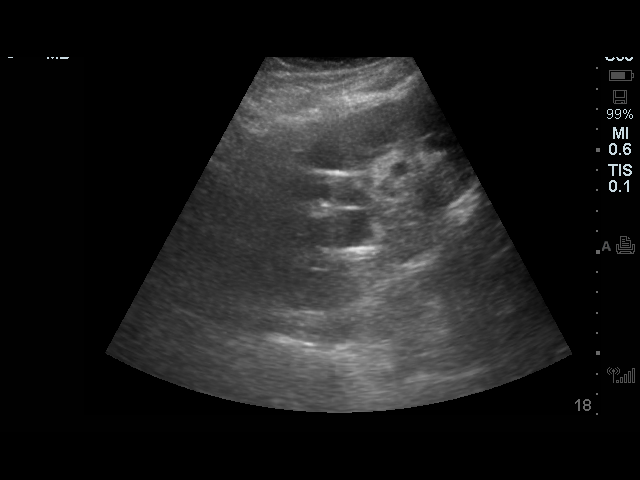

[1 of 1 positions shown; findings below may reference images not displayed]

EXAM:
IR NEPHROSTOGRAM JUCA QUITO PLACEMENT RIGHT

FLUOROSCOPY TIME:  2 minutes and 30 seconds.

MEDICATIONS AND MEDICAL HISTORY:
Versed 1 mg, Fentanyl 75 mcg.

Additional Medications: Ancef.

ANESTHESIA/SEDATION:
Moderate sedation time: 30 minutes

CONTRAST:  10 cc Omnipaque 300

PROCEDURE:
The procedure, risks, benefits, and alternatives were explained to
the patient. Questions regarding the procedure were encouraged and
answered. The patient understands and consents to the procedure.

The back was prepped with Betadine in a sterile fashion, and a
sterile drape was applied covering the operative field. A sterile
gown and sterile gloves were used for the procedure.

Under sonographic guidance, a 21 gauge needle was inserted into a
posterior lower pole calyx and removed over a 018 wire. This was up
sized to a 3 J. A 10 French nephrostomy was advanced over the wire
and looped in the renal pelvis. Contrast was injected.
FINDINGS: Imaging confirms access into the right kidney via posterior lower
pole calyx. Final image demonstrates a 10 French nephrostomy in
place.

COMPLICATIONS:
None
IMPRESSION: Successful right percutaneous nephrostomy catheter placement.
